# Patient Record
Sex: Male | Born: 1935 | Race: White | Hispanic: No | Smoking: Former smoker
Health system: Southern US, Community
[De-identification: ages and names within clinical notes are randomized; demographics above are authoritative.]

## PROBLEM LIST (undated history)

## (undated) DIAGNOSIS — Z973 Presence of spectacles and contact lenses: Secondary | ICD-10-CM

## (undated) DIAGNOSIS — I10 Essential (primary) hypertension: Secondary | ICD-10-CM

## (undated) DIAGNOSIS — E669 Obesity, unspecified: Secondary | ICD-10-CM

## (undated) DIAGNOSIS — B029 Zoster without complications: Secondary | ICD-10-CM

## (undated) DIAGNOSIS — M8580 Other specified disorders of bone density and structure, unspecified site: Secondary | ICD-10-CM

## (undated) DIAGNOSIS — Z972 Presence of dental prosthetic device (complete) (partial): Secondary | ICD-10-CM

## (undated) HISTORY — DX: Essential (primary) hypertension: I10

## (undated) HISTORY — DX: Zoster without complications: B02.9

## (undated) HISTORY — DX: Obesity, unspecified: E66.9

## (undated) HISTORY — PX: TONSILLECTOMY: SUR1361

## (undated) HISTORY — DX: Other specified disorders of bone density and structure, unspecified site: M85.80

## (undated) HISTORY — DX: Presence of dental prosthetic device (complete) (partial): Z97.2

## (undated) HISTORY — DX: Presence of spectacles and contact lenses: Z97.3

---

## 2001-07-07 ENCOUNTER — Encounter: Payer: Self-pay | Admitting: Family Medicine

## 2001-07-07 ENCOUNTER — Encounter: Admission: RE | Admit: 2001-07-07 | Discharge: 2001-07-07 | Payer: Self-pay | Admitting: Family Medicine

## 2003-10-21 DIAGNOSIS — B029 Zoster without complications: Secondary | ICD-10-CM

## 2003-10-21 HISTORY — DX: Zoster without complications: B02.9

## 2004-11-20 HISTORY — PX: INGUINAL HERNIA REPAIR: SUR1180

## 2005-03-17 ENCOUNTER — Emergency Department (HOSPITAL_COMMUNITY): Admission: EM | Admit: 2005-03-17 | Discharge: 2005-03-17 | Payer: Self-pay | Admitting: Emergency Medicine

## 2005-06-01 ENCOUNTER — Emergency Department (HOSPITAL_COMMUNITY): Admission: EM | Admit: 2005-06-01 | Discharge: 2005-06-01 | Payer: Self-pay | Admitting: Emergency Medicine

## 2006-06-01 ENCOUNTER — Ambulatory Visit: Payer: Self-pay | Admitting: Family Medicine

## 2006-07-02 ENCOUNTER — Ambulatory Visit: Payer: Self-pay | Admitting: Family Medicine

## 2006-07-31 ENCOUNTER — Ambulatory Visit: Payer: Self-pay | Admitting: Family Medicine

## 2006-08-31 ENCOUNTER — Ambulatory Visit: Payer: Self-pay | Admitting: Family Medicine

## 2006-10-01 ENCOUNTER — Ambulatory Visit: Payer: Self-pay | Admitting: Family Medicine

## 2006-10-29 ENCOUNTER — Ambulatory Visit: Payer: Self-pay | Admitting: Family Medicine

## 2007-04-27 ENCOUNTER — Ambulatory Visit: Payer: Self-pay | Admitting: Family Medicine

## 2007-10-26 ENCOUNTER — Ambulatory Visit: Payer: Self-pay | Admitting: Family Medicine

## 2008-02-07 ENCOUNTER — Ambulatory Visit: Payer: Self-pay | Admitting: Family Medicine

## 2008-10-20 HISTORY — PX: COLONOSCOPY W/ BIOPSIES AND POLYPECTOMY: SHX1376

## 2008-10-26 ENCOUNTER — Ambulatory Visit: Payer: Self-pay | Admitting: Family Medicine

## 2008-11-02 ENCOUNTER — Ambulatory Visit: Payer: Self-pay | Admitting: Internal Medicine

## 2008-11-13 ENCOUNTER — Ambulatory Visit: Payer: Self-pay | Admitting: Internal Medicine

## 2008-11-13 ENCOUNTER — Encounter: Payer: Self-pay | Admitting: Internal Medicine

## 2008-11-14 ENCOUNTER — Encounter: Payer: Self-pay | Admitting: Internal Medicine

## 2009-03-22 ENCOUNTER — Ambulatory Visit: Payer: Self-pay | Admitting: Family Medicine

## 2009-04-02 ENCOUNTER — Ambulatory Visit: Payer: Self-pay | Admitting: Family Medicine

## 2009-04-25 ENCOUNTER — Ambulatory Visit: Payer: Self-pay | Admitting: Family Medicine

## 2009-05-03 ENCOUNTER — Ambulatory Visit: Payer: Self-pay | Admitting: Family Medicine

## 2009-05-08 ENCOUNTER — Ambulatory Visit: Payer: Self-pay | Admitting: Family Medicine

## 2009-06-18 ENCOUNTER — Ambulatory Visit: Payer: Self-pay | Admitting: Family Medicine

## 2009-07-02 ENCOUNTER — Ambulatory Visit: Payer: Self-pay | Admitting: Family Medicine

## 2009-11-13 ENCOUNTER — Ambulatory Visit: Payer: Self-pay | Admitting: Family Medicine

## 2009-11-20 ENCOUNTER — Ambulatory Visit: Payer: Self-pay | Admitting: Family Medicine

## 2010-04-15 ENCOUNTER — Ambulatory Visit: Payer: Self-pay | Admitting: Family Medicine

## 2010-11-05 ENCOUNTER — Ambulatory Visit
Admission: RE | Admit: 2010-11-05 | Discharge: 2010-11-05 | Payer: Self-pay | Source: Home / Self Care | Attending: Family Medicine | Admitting: Family Medicine

## 2011-03-04 ENCOUNTER — Encounter: Payer: Self-pay | Admitting: Medical

## 2011-03-04 ENCOUNTER — Ambulatory Visit (INDEPENDENT_AMBULATORY_CARE_PROVIDER_SITE_OTHER): Payer: Medicare Other | Admitting: Medical

## 2011-03-04 VITALS — BP 130/68 | HR 60 | Ht 68.0 in | Wt 192.0 lb

## 2011-03-04 DIAGNOSIS — E871 Hypo-osmolality and hyponatremia: Secondary | ICD-10-CM

## 2011-03-04 DIAGNOSIS — R7301 Impaired fasting glucose: Secondary | ICD-10-CM | POA: Insufficient documentation

## 2011-03-04 DIAGNOSIS — Z0000 Encounter for general adult medical examination without abnormal findings: Secondary | ICD-10-CM

## 2011-03-04 DIAGNOSIS — I10 Essential (primary) hypertension: Secondary | ICD-10-CM

## 2011-03-04 LAB — POCT URINALYSIS DIPSTICK
Bilirubin, UA: NEGATIVE
Blood, UA: NEGATIVE
Glucose, UA: NEGATIVE
Ketones, UA: NEGATIVE
Leukocytes, UA: NEGATIVE
Nitrite, UA: NEGATIVE
Spec Grav, UA: 1.005
Urobilinogen, UA: NEGATIVE
pH, UA: 7

## 2011-03-04 LAB — CBC WITH DIFFERENTIAL/PLATELET
Basophils Absolute: 0.1 10*3/uL (ref 0.0–0.1)
HCT: 45.1 % (ref 39.0–52.0)
Lymphocytes Relative: 25 % (ref 12–46)
Monocytes Absolute: 1.2 10*3/uL — ABNORMAL HIGH (ref 0.1–1.0)
Neutro Abs: 6.7 10*3/uL (ref 1.7–7.7)
RDW: 12.2 % (ref 11.5–15.5)
WBC: 10.9 10*3/uL — ABNORMAL HIGH (ref 4.0–10.5)

## 2011-03-04 LAB — COMPREHENSIVE METABOLIC PANEL
ALT: 18 U/L (ref 0–53)
BUN: 9 mg/dL (ref 6–23)
CO2: 28 mEq/L (ref 19–32)
Calcium: 9 mg/dL (ref 8.4–10.5)
Chloride: 96 mEq/L (ref 96–112)
Creat: 0.81 mg/dL (ref 0.40–1.50)

## 2011-03-04 LAB — LIPID PANEL
Cholesterol: 182 mg/dL (ref 0–200)
HDL: 65 mg/dL (ref >39)
LDL Cholesterol: 99 mg/dL (ref 0–99)
Triglycerides: 91 mg/dL (ref <150)

## 2011-03-04 LAB — POCT GLYCOSYLATED HEMOGLOBIN (HGB A1C): Hemoglobin A1C: 5.5

## 2011-03-04 MED ORDER — BISOPROLOL-HYDROCHLOROTHIAZIDE 5-6.25 MG PO TABS: 1.0000 | ORAL_TABLET | Freq: Every day | ORAL | Status: DC

## 2011-03-04 MED ORDER — AMLODIPINE BESYLATE 5 MG PO TABS: 5.0000 mg | ORAL_TABLET | Freq: Every day | ORAL | Status: DC

## 2011-03-04 NOTE — Patient Instructions (Signed)
Preventative Care for Adults, Male       REGULAR HEALTH EXAMS:  A routine yearly physical is a good way to check in with your primary care provider about your health and preventive screening. It is also an opportunity to share updates about your health and any concerns you have, and receive a thorough all-over exam.   Most health insurance companies pay for at least some preventative services.  Check with your health plan for specific coverages.  WHAT PREVENTATIVE SERVICES DO MEN NEED?  Adult men should have their weight and blood pressure checked regularly.   Men age 35 and older should have their cholesterol levels checked regularly.  Beginning at age 50 and continuing to age 75, men should be screened for colorectal cancer.  Certain people should may need continued testing until age 85.  Other cancer screening may include exams for testicular and prostate cancer.  Updating vaccinations is part of preventative care.  Vaccinations help protect against diseases such as the flu.  Lab tests are generally done as part of preventative care to screen for anemia and blood disorders, to screen for problems with the kidneys and liver, to screen for bladder problems, to check blood sugar, and to check your cholesterol level.  Preventative services generally include counseling about diet, exercise, avoiding tobacco, drugs, excessive alcohol consumption, and sexually transmitted infections.    GENERAL RECOMMENDATIONS FOR GOOD HEALTH:  Healthy diet:  Eat a variety of foods, including fruit, vegetables, animal or vegetable protein, such as meat, fish, chicken, and eggs, or beans, lentils, tofu, and grains, such as rice.  Drink plenty of water daily.  Decrease saturated fat in the diet, avoid lots of red meat, processed foods, sweets, fast foods, and fried foods.  Exercise:  Aerobic exercise helps maintain good heart health. At least 30-40 minutes of moderate-intensity exercise is recommended.  For example, a brisk walk that increases your heart rate and breathing. This should be done on most days of the week.   Find a type of exercise or a variety of exercises that you enjoy so that it becomes a part of your daily life.  Examples are running, walking, swimming, water aerobics, and biking.  For motivation and support, explore group exercise such as aerobic class, spin class, Zumba, Yoga,or  martial arts, etc.    Set exercise goals for yourself, such as a certain weight goal, walk or run in a race such as a 5k walk/run.  Speak to your primary care provider about exercise goals.  Disease prevention:  If you smoke or chew tobacco, find out from your caregiver how to quit. It can literally save your life, no matter how long you have been a tobacco user. If you do not use tobacco, never begin.   Maintain a healthy diet and normal weight. Increased weight leads to problems with blood pressure and diabetes.   The Body Mass Index or BMI is a way of measuring how much of your body is fat. Having a BMI above 27 increases the risk of heart disease, diabetes, hypertension, stroke and other problems related to obesity. Your caregiver can help determine your BMI and based on it develop an exercise and dietary program to help you achieve or maintain this important measurement at a healthful level.  High blood pressure causes heart and blood vessel problems.  Persistent high blood pressure should be treated with medicine if weight loss and exercise do not work.   Fat and cholesterol leaves deposits in your arteries   that can block them. This causes heart disease and vessel disease elsewhere in your body.  If your cholesterol is found to be high, or if you have heart disease or certain other medical conditions, then you may need to have your cholesterol monitored frequently and be treated with medication.   Ask if you should have a stress test if your history suggests this. A stress test is a test done on  a treadmill that looks for heart disease. This test can find disease prior to there being a problem.  Avoid drinking alcohol in excess (more than two drinks per day).  Avoid use of street drugs. Do not share needles with anyone. Ask for professional help if you need assistance or instructions on stopping the use of alcohol, cigarettes, and/or drugs.  Brush your teeth twice a day with fluoride toothpaste, and floss once a day. Good oral hygiene prevents tooth decay and gum disease. The problems can be painful, unattractive, and can cause other health problems. Visit your dentist for a routine oral and dental check up and preventive care every 6-12 months.   Look at your skin regularly.  Use a mirror to look at your back. Notify your caregivers of changes in moles, especially if there are changes in shapes, colors, a size larger than a pencil eraser, an irregular border, or development of new moles.  Safety:  Use seatbelts 100% of the time, whether driving or as a passenger.  Use safety devices such as hearing protection if you work in environments with loud noise or significant background noise.  Use safety glasses when doing any work that could send debris in to the eyes.  Use a helmet if you ride a bike or motorcycle.  Use appropriate safety gear for contact sports.  Talk to your caregiver about gun safety.  Use sunscreen with a SPF (or skin protection factor) of 15 or greater.  Lighter skinned people are at a greater risk of skin cancer. Don't forget to also wear sunglasses in order to protect your eyes from too much damaging sunlight. Damaging sunlight can accelerate cataract formation.   Practice safe sex. Use condoms. Condoms are used for birth control and to help reduce the spread of sexually transmitted infections (or STIs).  Some of the STIs are gonorrhea (the clap), chlamydia, syphilis, trichomonas, herpes, HPV (human papilloma virus) and HIV (human immunodeficiency virus) which causes AIDS.  The herpes, HIV and HPV are viral illnesses that have no cure. These can result in disability, cancer and death.   Keep carbon monoxide and smoke detectors in your home functioning at all times. Change the batteries every 6 months or use a model that plugs into the wall.   Vaccinations:  Stay up to date with your tetanus shots and other required immunizations. You should have a booster for tetanus every 10 years. Be sure to get your flu shot every year, since 5%-20% of the [place] population comes down with the flu. The flu vaccine changes each year, so being vaccinated once is not enough. Get your shot in the fall, before the flu season peaks.   Other vaccines to consider:  Pneumococcal vaccine to protect against certain types of pneumonia.  This is normally recommended for adults age 65 or older.  However, adults younger than 75 years old with certain underlying conditions such as diabetes, heart or lung disease should also receive the vaccine.  Shingles vaccine to protect against Varicella Zoster if you are older than age 60, or younger   than 75 years old with certain underlying illness.  Hepatitis A vaccine to protect against a form of infection of the liver by a virus acquired from food.  Hepatitis B vaccine to protect against a form of infection of the liver by a virus acquired from blood or body fluids, particularly if you work in health care.  If you plan to travel internationally, check with your local health department for specific vaccination recommendations.  Cancer Screening:  Most routine colon cancer screening begins at the age of 50. On a yearly basis, doctors may provide special easy to use take-home tests to check for hidden blood in the stool. Sigmoidoscopy or colonoscopy can detect the earliest forms of colon cancer and is life saving. These tests use a small camera at the end of a tube to directly examine the colon. Speak to your caregiver about this at age 50, when routine  screening begins (and is repeated every 5 years unless early forms of pre-cancerous polyps or small growths are found).   At the age of 50 men usually start screening for prostate cancer every year. Screening may begin at a younger age for those with higher risk. Those at higher risk include African-Americans or having a family history of prostate cancer. There are two types of tests for prostate cancer:   Prostate-specific antigen (PSA) testing. Recent studies raise questions about prostate cancer using PSA and you should discuss this with your caregiver.   Digital rectal exam (in which your doctor's lubricated and gloved finger feels for enlargement of the prostate through the anus).   Screening for testicular cancer.  Do a monthly exam of your testicles. Gently roll each testicle between your thumb and fingers, feeling for any abnormal lumps. The best time to do this is after a hot shower or bath when the tissues are looser. Notify your caregivers of any lumps, tenderness or changes in size or shape immediately.     

## 2011-03-04 NOTE — Progress Notes (Signed)
Subjective:   HPI Here for physical exam, and general recheck. Needs medication refills, is fasting for labs. In general been doing okay, exercising regularly, trying the eat healthy, otherwise feels fine. No new complaints. He does note occasional left ear pressure, has seen ENT before for the same problem, and has been given nasal sprays before. Gets flu shot yearly, last pneumococcal vaccine through [facility] within the last 5 years.  Past Medical History  Diagnosis Date  . Hypertension   . Wears glasses    Past Surgical History  Procedure Date  . Right inguinal herniorrhaphy 11/2004  . Tonsillectomy   . Colonoscopy 2010    Benign polyps, repeat in 5 years    History   Social History  . Marital Status: Widowed    Spouse Name: N/A    Number of Children: N/A  . Years of Education: N/A   Occupational History  . Not on file.   Social History Main Topics  . Smoking status: Former Games developer  . Smokeless tobacco: Never Used  . Alcohol Use: 4.2 oz/week    7 Cans of beer per week  . Drug Use: No  . Sexually Active: Not on file     widowed; exercises daily with weights and aerobic   Other Topics Concern  . Not on file   Social History Narrative  . No narrative on file    Review of Systems Constitutional: denies fever, chills, sweats, unexpected weight change, anorexia, fatigue Allergy: negative; denies recent sneezing, itching, congestion Dermatology: denies changing moles, rash, lumps, new worrisome lesions ENT: Occasional left ear pain and pressure; no runny nose, sore throat, hoarseness, sinus pain, teeth pain, tinnitus, hearing loss, epistaxis Cardiology: denies chest pain, palpitations, edema, orthopnea, paroxysmal nocturnal dyspnea Respiratory: denies cough, shortness of breath, dyspnea on exertion, wheezing, hemoptysis Gastroenterology: denies abdominal pain, nausea, vomiting, diarrhea, constipation, blood in stool, changes in bowel movement, dysphagia Hematology:  denies bleeding or bruising problems Musculoskeletal: denies arthralgias, myalgias, joint swelling, back pain, neck pain, cramping, gait changes Ophthalmology: denies vision changes, eye redness, itching, discharge Urology: denies dysuria, difficulty urinating, hematuria, urinary frequency, urgency, incontinence Neurology: no headache, weakness, tingling, numbness, speech abnormality, memory loss, falls, dizziness Psychology: denies depressed mood, agitation, sleep problems     Objective:   Physical Exam Filed Vitals:   03/04/11 0847  BP: 130/68  Pulse: 60    General appearance: alert, no distress, WD/WN, white male , looks stated age Skin: Left dorsal forearm with a few crusting small raised 3-4 mm papules that appear to be actinic keratoses, left upper arm laterally with 1 cm roundish raise somewhat verrucoid appearing lesion, scattered flat macules in solar keratoses on arms and face, scattered benign-appearing macules on the back, otherwise warm, dry HEENT: normocephalic, conjunctiva/corneas normal, sclerae anicteric, PERRLA, EOMi, nares patent, no discharge or erythema, pharynx normal Oral cavity: upper partial plate, MMM, tongue normal, teeth normal Neck: supple, no lymphadenopathy, no thyromegaly, no masses, normal ROM Chest: non tender, normal shape and expansion Heart: RRR, normal S1, S2, no murmurs Lungs: CTA bilaterally, no wheezes, rhonchi, or rales Abdomen: +bs, soft, non tender, non distended, somewhat guarded, no masses, no hepatomegaly, no splenomegaly, no bruits Back: non tender, normal ROM, no scoliosis Musculoskeletal: upper extremities non tender, no obvious deformity, normal ROM throughout, lower extremities non tender, no obvious deformity, normal ROM throughout Extremities: no edema, no cyanosis, no clubbing Genitourinary: Normal external genitalia, circumcised, no scrotal masses, nontender, no lesions, no hernias Rectal: No hemorrhoids, prostate within normal  limits, no nodules, guaiac negative Pulses: 2+ symmetric, upper and lower extremities, normal cap refill Neurological: alert, oriented x 3, CN2-12 intact, strength normal upper extremities and lower extremities, sensation normal throughout, DTRs 2+ throughout, no cerebellar signs, gait normal Psychiatric: normal affect, behavior normal, pleasant      Assessment & Plan:    Encounter Diagnoses  Name Primary?  . Annual physical exam   . Impaired fasting glucose   . Hypertension Yes  . Hyponatremia    Recommended he continue regular exercise, eat healthy low-fat, low-salt diet, advised weight loss, discussed preventative care, vaccines, lab tests today. Handout given today.  Impaired fasting glucose-hemoglobin A1c today within normal range not diabetic.  Hypertension-controlled on current medication, refill sent.  We will call with lab results.

## 2011-03-05 ENCOUNTER — Telehealth: Payer: Self-pay | Admitting: *Deleted

## 2011-03-05 NOTE — Telephone Encounter (Addendum)
Message copied by Ellsworth Lennox on Wed Mar 05, 2011  2:56 PM ------      Message from: Aleen Campi, DAVID      Created: Wed Mar 05, 2011  8:56 AM       Labs look good.  Normal cholesterol, liver, kidneys, lytes, blood counts, and urine.   His sodium was a little low, but I believe this is related to his medication with the hydrochlorothiazide fluid pill; nothing to worry about.   C/t same meds, c/t regular exercise and healthy diet.  As long as he is doing well without problems, we can see him back in 6-12 mo.    Patient informed of all labs.  Patient requested copy of labs and copy was mailed to patient.  Patient stated he is doing well, with no problems at this time.  Will come back in 6 to 12 months.  CM, LPN

## 2011-03-10 ENCOUNTER — Encounter: Payer: Self-pay | Admitting: Medical

## 2012-03-05 ENCOUNTER — Ambulatory Visit (INDEPENDENT_AMBULATORY_CARE_PROVIDER_SITE_OTHER): Payer: Medicare Other | Admitting: Medical

## 2012-03-05 ENCOUNTER — Encounter: Payer: Self-pay | Admitting: Medical

## 2012-03-05 VITALS — BP 130/60 | HR 58 | Temp 98.2°F | Resp 16 | Ht 68.0 in | Wt 195.0 lb

## 2012-03-05 DIAGNOSIS — I10 Essential (primary) hypertension: Secondary | ICD-10-CM | POA: Insufficient documentation

## 2012-03-05 DIAGNOSIS — R7301 Impaired fasting glucose: Secondary | ICD-10-CM | POA: Diagnosis not present

## 2012-03-05 DIAGNOSIS — Z0000 Encounter for general adult medical examination without abnormal findings: Secondary | ICD-10-CM | POA: Diagnosis not present

## 2012-03-05 LAB — LIPID PANEL
Cholesterol: 176 mg/dL (ref 0–200)
HDL: 67 mg/dL (ref >39)
Total CHOL/HDL Ratio: 2.6 Ratio
Triglycerides: 83 mg/dL (ref <150)

## 2012-03-05 LAB — COMPREHENSIVE METABOLIC PANEL
BUN: 11 mg/dL (ref 6–23)
CO2: 29 mEq/L (ref 19–32)
Calcium: 8.8 mg/dL (ref 8.4–10.5)
Chloride: 96 mEq/L (ref 96–112)
Creat: 0.84 mg/dL (ref 0.50–1.35)
Glucose, Bld: 95 mg/dL (ref 70–99)

## 2012-03-05 MED ORDER — AMLODIPINE BESYLATE 5 MG PO TABS: 5.0000 mg | ORAL_TABLET | Freq: Every day | ORAL | Status: DC

## 2012-03-05 MED ORDER — BISOPROLOL-HYDROCHLOROTHIAZIDE 5-6.25 MG PO TABS: 1.0000 | ORAL_TABLET | Freq: Every day | ORAL | Status: DC

## 2012-03-05 NOTE — Progress Notes (Addendum)
Subjective:   HPI  Daniel Andersen is a 78 y.o. male who presents for an annual wellness exam for medicare.  He sees no other doctors.    He is also here for recheck on blood pressure, needs refills.   No particular c/o otherwise.    Impaired fasting glucose - he reports exercising most days of the week at the [facility] with cardio and weights.  Eating healthy per his report. He does drink 2-4 beers on any given day.  Reviewed their medical, surgical, family, social, medication, and allergy history and updated chart as appropriate.  Past Medical History  Diagnosis Date  . Hypertension   . Wears glasses     Past Surgical History  Procedure Date  . Right inguinal herniorrhaphy 11/2004  . Tonsillectomy   . Colonoscopy 2010    Benign polyps, repeat in 5 years    History reviewed. No pertinent family history.  History   Social History  . Marital Status: Widowed    Spouse Name: N/A    Number of Children: N/A  . Years of Education: N/A   Occupational History  . retired, works part time - [employer]    Social History Main Topics  . Smoking status: Former Games developer  . Smokeless tobacco: Never Used  . Alcohol Use: 8.4 oz/week    14 Cans of beer per week  . Drug Use: No  . Sexually Active: Not on file     widowed; exercises daily with weights and aerobic   Other Topics Concern  . Not on file   Social History Narrative   Widowed, daughter in [place], exercising - gym every morning - cardio with treadmill, machine weights    Current Outpatient Prescriptions on File Prior to Visit  Medication Sig Dispense Refill  . amLODipine (NORVASC) 5 MG tablet Take 1 tablet (5 mg total) by mouth daily.  90 tablet  3  . aspirin 325 MG tablet Take 325 mg by mouth daily. 1/2 tablet daily      . bisoprolol-hydrochlorothiazide (ZIAC) 5-6.25 MG per tablet Take 1 tablet by mouth daily.  90 tablet  3  . Cholecalciferol (VITAMIN D) 1000 UNITS capsule Take 1,000 Units by mouth daily.        .  fish oil-omega-3 fatty acids 1000 MG capsule Take 2 g by mouth daily.        . Multiple Vitamins-Minerals (CENTRUM SILVER ULTRA MENS) TABS Take 1 tablet by mouth daily.          No Known Allergies   Review of Systems Constitutional: denies fever, chills, sweats, unexpected weight change, anorexia, fatigue Allergy: negative; denies recent sneezing, itching, congestion Dermatology: denies changing moles, rash, lumps, new worrisome lesions ENT: no runny nose, ear pain, sore throat, hoarseness, sinus pain, teeth pain, tinnitus, hearing loss, epistaxis Cardiology: denies chest pain, palpitations, edema, orthopnea, paroxysmal nocturnal dyspnea Respiratory: denies cough, shortness of breath, dyspnea on exertion, wheezing, hemoptysis Gastroenterology: denies abdominal pain, nausea, vomiting, diarrhea, constipation, blood in stool, changes in bowel movement, dysphagia Hematology: denies bleeding or bruising problems Musculoskeletal: denies arthralgias, myalgias, joint swelling, back pain, neck pain, cramping, gait changes Ophthalmology: denies vision changes, eye redness, itching, discharge Urology: denies dysuria, difficulty urinating, hematuria, urinary frequency, urgency, incontinence Neurology: no headache, weakness, tingling, numbness, speech abnormality, memory loss, falls, dizziness Psychology: denies depressed mood, agitation, sleep problems     Objective:   Physical Exam  Filed Vitals:   03/05/12 0916  BP: 130/60  Pulse: 58  Temp:  98.2 F (36.8 C)  Resp: 16    General appearance: alert, no distress, WD/WN, white male Skin:warm, dry Oral cavity: MMM, tongue normal, teeth normal Neck: supple, no lymphadenopathy, no thyromegaly, no masses, normal ROM, no bruits Heart: RRR, normal S1, S2, no murmurs Lungs: CTA bilaterally, no wheezes, rhonchi, or rales Abdomen: +bs, soft, non tender, non distended, no masses, no hepatomegaly, no splenomegaly, no bruits Extremities: no edema, no  cyanosis, no clubbing Pulses: 2+ symmetric, upper and lower extremities, normal cap refill   Assessment and Plan :    Encounter Diagnoses  Name Primary?  . Essential hypertension, benign Yes  . Impaired fasting blood sugar   . Medicare annual wellness visit, subsequent     Physical exam - discussed healthy lifestyle, diet, exercise, preventative care, vaccinations, and addressed their concerns.  Handout given.   Advised he limit alcohol to 2 drinks or less daily.  Discussed safety, reviewed his screens for ADLs, depression, fall risk.  He lives alone, independent, no current concerns for falls, depression, and independent with ADLs.  HTN - continue current medication, labs today.  C/t regular exercise, low salt diet.  Impaired fasting glucose - labs today   Follow-up pending labs.

## 2012-03-05 NOTE — Patient Instructions (Signed)
Hypertension - continue your current medications, continue daily exercise, and eat a healthy low fat diet.    Diet - limit your alcohol consumption to 2 drink or less daily.   Overweight - work on [employer].  Preventative Care for Adults, Male       REGULAR HEALTH EXAMS:  A routine yearly physical is a good way to check in with your primary care provider about your health and preventive screening. It is also an opportunity to share updates about your health and any concerns you have, and receive a thorough all-over exam.   Most health insurance companies pay for at least some preventative services.  Check with your health plan for specific coverages.  WHAT PREVENTATIVE SERVICES DO MEN NEED?  Adult men should have their weight and blood pressure checked regularly.   Men age 64 and older should have their cholesterol levels checked regularly.  Beginning at age 64 and continuing to age 68, men should be screened for colorectal cancer.  Certain people should may need continued testing until age 60.  Other cancer screening may include exams for testicular and prostate cancer.  Updating vaccinations is part of preventative care.  Vaccinations help protect against diseases such as the flu.  Lab tests are generally done as part of preventative care to screen for anemia and blood disorders, to screen for problems with the kidneys and liver, to screen for bladder problems, to check blood sugar, and to check your cholesterol level.  Preventative services generally include counseling about diet, exercise, avoiding tobacco, drugs, excessive alcohol consumption, and sexually transmitted infections.    GENERAL RECOMMENDATIONS FOR GOOD HEALTH:  Healthy diet:  Eat a variety of foods, including fruit, vegetables, animal or vegetable protein, such as meat, fish, chicken, and eggs, or beans, lentils, tofu, and grains, such as rice.  Drink plenty of water daily.  Decrease saturated fat in the diet,  avoid lots of red meat, processed foods, sweets, fast foods, and fried foods.  Exercise:  Aerobic exercise helps maintain good heart health. At least 30-40 minutes of moderate-intensity exercise is recommended. For example, a brisk walk that increases your heart rate and breathing. This should be done on most days of the week.   Find a type of exercise or a variety of exercises that you enjoy so that it becomes a part of your daily life.  Examples are running, walking, swimming, water aerobics, and biking.  For motivation and support, explore group exercise such as aerobic class, spin class, Zumba, Yoga,or  martial arts, etc.    Set exercise goals for yourself, such as a certain weight goal, walk or run in a race such as a 5k walk/run.  Speak to your primary care provider about exercise goals.  Disease prevention:  If you smoke or chew tobacco, find out from your caregiver how to quit. It can literally save your life, no matter how long you have been a tobacco user. If you do not use tobacco, never begin.   Maintain a healthy diet and normal weight. Increased weight leads to problems with blood pressure and diabetes.   The Body Mass Index or BMI is a way of measuring how much of your body is fat. Having a BMI above 27 increases the risk of heart disease, diabetes, hypertension, stroke and other problems related to obesity. Your caregiver can help determine your BMI and based on it develop an exercise and dietary program to help you achieve or maintain this important measurement at a  healthful level.  High blood pressure causes heart and blood vessel problems.  Persistent high blood pressure should be treated with medicine if weight loss and exercise do not work.   Fat and cholesterol leaves deposits in your arteries that can block them. This causes heart disease and vessel disease elsewhere in your body.  If your cholesterol is found to be high, or if you have heart disease or certain other  medical conditions, then you may need to have your cholesterol monitored frequently and be treated with medication.   Ask if you should have a stress test if your history suggests this. A stress test is a test done on a treadmill that looks for heart disease. This test can find disease prior to there being a problem.  Avoid drinking alcohol in excess (more than two drinks per day).  Avoid use of street drugs. Do not share needles with anyone. Ask for professional help if you need assistance or instructions on stopping the use of alcohol, cigarettes, and/or drugs.  Brush your teeth twice a day with fluoride toothpaste, and floss once a day. Good oral hygiene prevents tooth decay and gum disease. The problems can be painful, unattractive, and can cause other health problems. Visit your dentist for a routine oral and dental check up and preventive care every 6-12 months.   Look at your skin regularly.  Use a mirror to look at your back. Notify your caregivers of changes in moles, especially if there are changes in shapes, colors, a size larger than a pencil eraser, an irregular border, or development of new moles.  Safety:  Use seatbelts 100% of the time, whether driving or as a passenger.  Use safety devices such as hearing protection if you work in environments with loud noise or significant background noise.  Use safety glasses when doing any work that could send debris in to the eyes.  Use a helmet if you ride a bike or motorcycle.  Use appropriate safety gear for contact sports.  Talk to your caregiver about gun safety.  Use sunscreen with a SPF (or skin protection factor) of 15 or greater.  Lighter skinned people are at a greater risk of skin cancer. Don't forget to also wear sunglasses in order to protect your eyes from too much damaging sunlight. Damaging sunlight can accelerate cataract formation.   Practice safe sex. Use condoms. Condoms are used for birth control and to help reduce the spread  of sexually transmitted infections (or STIs).  Some of the STIs are gonorrhea (the clap), chlamydia, syphilis, trichomonas, herpes, HPV (human papilloma virus) and HIV (human immunodeficiency virus) which causes AIDS. The herpes, HIV and HPV are viral illnesses that have no cure. These can result in disability, cancer and death.   Keep carbon monoxide and smoke detectors in your home functioning at all times. Change the batteries every 6 months or use a model that plugs into the wall.   Vaccinations:  Stay up to date with your tetanus shots and other required immunizations. You should have a booster for tetanus every 10 years. Be sure to get your flu shot every year, since 5%-20% of the [place] population comes down with the flu. The flu vaccine changes each year, so being vaccinated once is not enough. Get your shot in the fall, before the flu season peaks.   Other vaccines to consider:  Pneumococcal vaccine to protect against certain types of pneumonia.  This is normally recommended for adults age 56 or older.  However, adults younger than 76 years old with certain underlying conditions such as diabetes, heart or lung disease should also receive the vaccine.  Shingles vaccine to protect against Varicella Zoster if you are older than age 36, or younger than 76 years old with certain underlying illness.  Hepatitis A vaccine to protect against a form of infection of the liver by a virus acquired from food.  Hepatitis B vaccine to protect against a form of infection of the liver by a virus acquired from blood or body fluids, particularly if you work in health care.  If you plan to travel internationally, check with your local health department for specific vaccination recommendations.  Cancer Screening:  Most routine colon cancer screening begins at the age of 77. On a yearly basis, doctors may provide special easy to use take-home tests to check for hidden blood in the stool. Sigmoidoscopy or  colonoscopy can detect the earliest forms of colon cancer and is life saving. These tests use a small camera at the end of a tube to directly examine the colon. Speak to your caregiver about this at age 17, when routine screening begins (and is repeated every 5 years unless early forms of pre-cancerous polyps or small growths are found).   At the age of 47 men usually start screening for prostate cancer every year. Screening may begin at a younger age for those with higher risk. Those at higher risk include African-Americans or having a family history of prostate cancer. There are two types of tests for prostate cancer:   Prostate-specific antigen (PSA) testing. Recent studies raise questions about prostate cancer using PSA and you should discuss this with your caregiver.   Digital rectal exam (in which your doctor's lubricated and gloved finger feels for enlargement of the prostate through the anus).   Screening for testicular cancer.  Do a monthly exam of your testicles. Gently roll each testicle between your thumb and fingers, feeling for any abnormal lumps. The best time to do this is after a hot shower or bath when the tissues are looser. Notify your caregivers of any lumps, tenderness or changes in size or shape immediately.

## 2012-06-28 ENCOUNTER — Encounter: Payer: Self-pay | Admitting: Internal Medicine

## 2012-06-28 DIAGNOSIS — Z23 Encounter for immunization: Secondary | ICD-10-CM | POA: Diagnosis not present

## 2012-08-06 ENCOUNTER — Other Ambulatory Visit: Payer: Self-pay

## 2012-08-06 DIAGNOSIS — C44211 Basal cell carcinoma of skin of unspecified ear and external auricular canal: Secondary | ICD-10-CM | POA: Diagnosis not present

## 2012-08-06 DIAGNOSIS — L570 Actinic keratosis: Secondary | ICD-10-CM | POA: Diagnosis not present

## 2012-08-23 DIAGNOSIS — H6120 Impacted cerumen, unspecified ear: Secondary | ICD-10-CM | POA: Diagnosis not present

## 2012-08-23 DIAGNOSIS — H6500 Acute serous otitis media, unspecified ear: Secondary | ICD-10-CM | POA: Diagnosis not present

## 2012-08-23 DIAGNOSIS — H902 Conductive hearing loss, unspecified: Secondary | ICD-10-CM | POA: Diagnosis not present

## 2012-09-24 DIAGNOSIS — L570 Actinic keratosis: Secondary | ICD-10-CM | POA: Diagnosis not present

## 2012-09-24 DIAGNOSIS — L219 Seborrheic dermatitis, unspecified: Secondary | ICD-10-CM | POA: Diagnosis not present

## 2012-09-24 DIAGNOSIS — Z85828 Personal history of other malignant neoplasm of skin: Secondary | ICD-10-CM | POA: Diagnosis not present

## 2012-11-02 DIAGNOSIS — J00 Acute nasopharyngitis [common cold]: Secondary | ICD-10-CM | POA: Diagnosis not present

## 2012-11-02 DIAGNOSIS — H902 Conductive hearing loss, unspecified: Secondary | ICD-10-CM | POA: Diagnosis not present

## 2012-11-02 DIAGNOSIS — H6500 Acute serous otitis media, unspecified ear: Secondary | ICD-10-CM | POA: Diagnosis not present

## 2013-02-17 ENCOUNTER — Telehealth: Payer: Self-pay | Admitting: Medical

## 2013-02-17 DIAGNOSIS — I10 Essential (primary) hypertension: Secondary | ICD-10-CM

## 2013-02-17 MED ORDER — AMLODIPINE BESYLATE 5 MG PO TABS: 5.0000 mg | ORAL_TABLET | Freq: Every day | ORAL | Status: DC

## 2013-02-17 MED ORDER — BISOPROLOL-HYDROCHLOROTHIAZIDE 5-6.25 MG PO TABS: 1.0000 | ORAL_TABLET | Freq: Every day | ORAL | Status: DC

## 2013-02-17 NOTE — Telephone Encounter (Signed)
Done for #30 of each  

## 2013-03-08 ENCOUNTER — Other Ambulatory Visit: Payer: Self-pay | Admitting: Medical

## 2013-03-08 ENCOUNTER — Encounter: Payer: Self-pay | Admitting: Medical

## 2013-03-08 ENCOUNTER — Ambulatory Visit (INDEPENDENT_AMBULATORY_CARE_PROVIDER_SITE_OTHER): Payer: Medicare Other | Admitting: Medical

## 2013-03-08 VITALS — BP 130/70 | HR 62 | Temp 97.7°F | Resp 16 | Ht 63.0 in | Wt 196.0 lb

## 2013-03-08 DIAGNOSIS — Z79899 Other long term (current) drug therapy: Secondary | ICD-10-CM | POA: Diagnosis not present

## 2013-03-08 DIAGNOSIS — R7301 Impaired fasting glucose: Secondary | ICD-10-CM

## 2013-03-08 DIAGNOSIS — R29890 Loss of height: Secondary | ICD-10-CM

## 2013-03-08 DIAGNOSIS — I10 Essential (primary) hypertension: Secondary | ICD-10-CM

## 2013-03-08 DIAGNOSIS — M899 Disorder of bone, unspecified: Secondary | ICD-10-CM

## 2013-03-08 DIAGNOSIS — M40209 Unspecified kyphosis, site unspecified: Secondary | ICD-10-CM

## 2013-03-08 DIAGNOSIS — E669 Obesity, unspecified: Secondary | ICD-10-CM

## 2013-03-08 DIAGNOSIS — M949 Disorder of cartilage, unspecified: Secondary | ICD-10-CM

## 2013-03-08 DIAGNOSIS — F1010 Alcohol abuse, uncomplicated: Secondary | ICD-10-CM

## 2013-03-08 DIAGNOSIS — M4000 Postural kyphosis, site unspecified: Secondary | ICD-10-CM

## 2013-03-08 LAB — CBC WITH DIFFERENTIAL/PLATELET
Basophils Relative: 1 % (ref 0–1)
HCT: 43.2 % (ref 39.0–52.0)
Hemoglobin: 15.3 g/dL (ref 13.0–17.0)
Lymphs Abs: 2.2 10*3/uL (ref 0.7–4.0)
MCH: 35.4 pg — ABNORMAL HIGH (ref 26.0–34.0)
MCHC: 35.4 g/dL (ref 30.0–36.0)
Monocytes Absolute: 1.2 10*3/uL — ABNORMAL HIGH (ref 0.1–1.0)
Monocytes Relative: 12 % (ref 3–12)
Neutro Abs: 6.1 10*3/uL (ref 1.7–7.7)
Neutrophils Relative %: 63 % (ref 43–77)
RBC: 4.32 MIL/uL (ref 4.22–5.81)

## 2013-03-08 MED ORDER — AMLODIPINE BESYLATE 5 MG PO TABS: 5.0000 mg | ORAL_TABLET | Freq: Every day | ORAL | Status: DC

## 2013-03-08 MED ORDER — BISOPROLOL-HYDROCHLOROTHIAZIDE 5-6.25 MG PO TABS: 1.0000 | ORAL_TABLET | Freq: Every day | ORAL | Status: DC

## 2013-03-08 NOTE — Progress Notes (Signed)
Subjective:   HPI  Daniel Andersen is a 79 y.o. male who presents for a complete physical/general yearly recheck.   Preventative care: Last ophthalmology visit:n/a Last dental visit:n/a Last colonoscopy:11/13/2008 Last prostate exam: ? Last EKG:11/13/2009 Last labs:03/05/2012  Prior vaccinations: TD or Tdap:10/26/2008 Influenza:06/2012 Pneumococcal:n/a Shingles/Zostavax:2005  Advanced directive:n/a Health care power of attorney:n/a Living will:yes  Concerns: Sees dermatology, Dr. 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  Said the facial lesions were benign.  HTN - compliant, exercising regularly, but still eats the same diet as usual, drinks aobut 3 beers or wine daily.   At the gym gets 110-120 SBP, 70s DBP.  Reviewed their medical, surgical, family, social, medication, and allergy history and updated chart as appropriate.   Past Medical History  Diagnosis Date  . Hypertension   . Wears glasses   . Wears partial dentures   . Shingles 2005    Past Surgical History  Procedure Laterality Date  . Right inguinal herniorrhaphy  11/2004  . Tonsillectomy    . Colonoscopy  2010    Benign polyps, repeat in 5 years    History reviewed. No pertinent family history.  History   Social History  . Marital Status: Widowed    Spouse Name: N/A    Number of Children: N/A  . Years of Education: N/A   Occupational History  . retired, works part time - [employer]    Social History Main Topics  . Smoking status: Former Games developer  . Smokeless tobacco: Never Used  . Alcohol Use: 8.4 oz/week    14 Cans of beer per week  . Drug Use: No  . Sexually Active: Not on file     Comment: widowed; exercises daily with weights and aerobic   Other Topics Concern  . Not on file   Social History Narrative   Widowed, daughter in [place], exercising - gym every morning - cardio with treadmill, machine weights.   Works at [employer].    Travels up to his  hometown in [place] occasionally    Current Outpatient Prescriptions on File Prior to Visit  Medication Sig Dispense Refill  . amLODipine (NORVASC) 5 MG tablet Take 1 tablet (5 mg total) by mouth daily.  30 tablet  0  . aspirin 325 MG tablet Take 325 mg by mouth daily. 1/2 tablet daily      . bisoprolol-hydrochlorothiazide (ZIAC) 5-6.25 MG per tablet Take 1 tablet by mouth daily.  30 tablet  0  . Cholecalciferol (VITAMIN D) 1000 UNITS capsule Take 400 Units by mouth daily.       . fish oil-omega-3 fatty acids 1000 MG capsule Take 2 g by mouth daily.        . Multiple Vitamins-Minerals (CENTRUM SILVER ULTRA MENS) TABS Take 1 tablet by mouth daily.         No current facility-administered medications on file prior to visit.    No Known Allergies   Review of Systems Constitutional: -fever, -chills, -sweats, -unexpected weight change, -decreased appetite, -fatigue Allergy: -sneezing, -itching, -congestion Dermatology: -changing moles, --rash, -lumps ENT: -runny nose, -ear pain, -sore throat, -hoarseness, -sinus pain, -teeth pain, + ringing in ears, -hearing loss, -nosebleeds Cardiology: -chest pain, -palpitations, -swelling, -difficulty breathing when lying flat, -waking up short of breath Respiratory: -cough, -shortness of breath, -difficulty breathing with exercise or exertion, -wheezing, -coughing up blood Gastroenterology: -abdominal pain, -nausea, -vomiting, -diarrhea, -constipation, -blood in stool, -changes in bowel movement, -difficulty swallowing or eating Hematology: -  bleeding, -bruising  Musculoskeletal: -joint aches, -muscle aches, -joint swelling, -back pain, -neck pain, -cramping, -changes in gait Ophthalmology: denies vision changes, eye redness, itching, discharge Urology: -burning with urination, -difficulty urinating, -blood in urine, -urinary frequency, -urgency, -incontinence Neurology: -headache, -weakness, -tingling, -numbness, -memory loss, -falls,  -dizziness Psychology: -depressed mood, -agitation, -sleep problems     Objective:   Physical Exam  Filed Vitals:   03/08/13 0914  BP: 130/70  Pulse: 62  Temp: 97.7 F (36.5 C)  Resp: 16    General appearance: alert, no distress, WD/WN, white male Skin: raised flesh colored lesions on bilat cheeks, unchanged per patient, few scattered seborrheic keratosis of back, other scattered benign appearing macules HEENT: normocephalic, conjunctiva/corneas normal, sclerae anicteric, PERRLA, EOMi, nares patent, no discharge or erythema, pharynx normal Oral cavity: MMM, tongue normal, teeth in good repair, partial upper and lower dentures present Neck: supple, no lymphadenopathy, no thyromegaly, no masses, normal ROM, no bruits Chest: non tender, normal shape and expansion Heart: RRR, normal S1, S2, no murmurs Lungs: CTA bilaterally, no wheezes, rhonchi, or rales Abdomen: +bs, soft, non tender, non distended, no masses, no hepatomegaly, no splenomegaly, no bruits Back: upper back showing more kyphosis than prior, non tender, othewrise normal ROM, no scoliosis Musculoskeletal: upper extremities non tender, no obvious deformity, normal ROM throughout, lower extremities non tender, no obvious deformity, normal ROM throughout Extremities: no edema, no cyanosis, no clubbing Pulses: 2+ symmetric, upper and lower extremities, normal cap refill Neurological: alert, oriented x 3, CN2-12 intact, strength normal upper extremities and lower extremities, sensation normal throughout, DTRs 2+ throughout, no cerebellar signs, gait normal Psychiatric: normal affect, behavior normal, pleasant  GU: normal male external genitalia, nontender, no masses, no hernia, no lymphadenopathy Rectal: deferred   Assessment and Plan :      Encounter Diagnoses  Name Primary?  . Essential hypertension, benign Yes  . Loss of height   . Excessive drinking alcohol   . Kyphosis   . Disorder of bone and cartilage,  unspecified   . Obesity, unspecified   . Impaired fasting blood sugar   . Encounter for long-term (current) use of other medications     Physical exam - discussed healthy lifestyle, diet, exercise, preventative care, vaccinations, and addressed their concerns.  Advised he see dentist and eye doctors yearly.    HTN - controlled, c/t same medication   Excessive alcohol consumption - advised less than 2 drinks daily  Loss of height, kyphosis - will try and get bone density and Vit D lab today  Obesity - work on diet changes to lose weight  Follow-up pending labs

## 2013-03-08 NOTE — Patient Instructions (Signed)
   Continue exercising regularly  Continue your current medications daily  See your eye doctor and dentist yearly  Check on insurance coverage for Shingles vaccine  Cut down on alcohol consumption.   Preferably only drink 1 beer daily.    We will call with lab results.  Try and lose some weight through diet changes.

## 2013-03-09 ENCOUNTER — Other Ambulatory Visit: Payer: Self-pay | Admitting: Medical

## 2013-03-09 DIAGNOSIS — E871 Hypo-osmolality and hyponatremia: Secondary | ICD-10-CM

## 2013-03-09 LAB — COMPREHENSIVE METABOLIC PANEL
AST: 23 U/L (ref 0–37)
Albumin: 3.9 g/dL (ref 3.5–5.2)
Alkaline Phosphatase: 65 U/L (ref 39–117)
BUN: 10 mg/dL (ref 6–23)
Calcium: 9.2 mg/dL (ref 8.4–10.5)
Creat: 0.79 mg/dL (ref 0.50–1.35)
Glucose, Bld: 95 mg/dL (ref 70–99)

## 2013-03-09 LAB — HEMOGLOBIN A1C
Hgb A1c MFr Bld: 5.4 % (ref <5.7)
Mean Plasma Glucose: 108 mg/dL (ref <117)

## 2013-03-10 NOTE — Progress Notes (Signed)
Quick Note:  CALLED PT INFORMED For patient:   His labs show glucose, kidneys, liver, blood counts ok. However, his sodium is still low, lower than prior. I am adding a thyroid test, and I want him to go for Chest Xray to look for other causes of low sodium. chandra will call him back about bone density test. Please have him go for chest xray. Order in system. Also ask him to cut back on beer intake as we discussed. PT ADVISED WORD FOR WORD AND TO GO TO [facility] TO HAVE CHEST X RAY DONE HE SAID HE ALREADY HAD BONE DENSITY SCHEDULED AND GAVE TO JO TO HAVE A1C DONE  ______

## 2013-03-16 ENCOUNTER — Other Ambulatory Visit: Payer: Medicare Other

## 2013-03-17 ENCOUNTER — Ambulatory Visit
Admission: RE | Admit: 2013-03-17 | Discharge: 2013-03-17 | Disposition: A | Discharge status: Home / Self Care | Payer: Medicare Other | Source: Ambulatory Visit | Attending: Medical | Admitting: Medical

## 2013-03-17 DIAGNOSIS — M899 Disorder of bone, unspecified: Secondary | ICD-10-CM

## 2013-03-17 DIAGNOSIS — M949 Disorder of cartilage, unspecified: Secondary | ICD-10-CM

## 2013-03-20 DIAGNOSIS — M8580 Other specified disorders of bone density and structure, unspecified site: Secondary | ICD-10-CM

## 2013-03-20 HISTORY — DX: Other specified disorders of bone density and structure, unspecified site: M85.80

## 2013-03-21 ENCOUNTER — Other Ambulatory Visit: Payer: Self-pay | Admitting: Medical

## 2013-03-21 ENCOUNTER — Emergency Department (HOSPITAL_COMMUNITY): Payer: Medicare Other

## 2013-03-21 ENCOUNTER — Other Ambulatory Visit: Payer: Self-pay

## 2013-03-21 ENCOUNTER — Encounter (HOSPITAL_COMMUNITY): Payer: Self-pay | Admitting: *Deleted

## 2013-03-21 ENCOUNTER — Emergency Department (HOSPITAL_COMMUNITY)
Admission: EM | Admit: 2013-03-21 | Discharge: 2013-03-21 | Disposition: A | Discharge status: Home / Self Care | Payer: Medicare Other | Attending: Emergency Medicine | Admitting: Emergency Medicine

## 2013-03-21 DIAGNOSIS — Z7982 Long term (current) use of aspirin: Secondary | ICD-10-CM | POA: Diagnosis not present

## 2013-03-21 DIAGNOSIS — Z8619 Personal history of other infectious and parasitic diseases: Secondary | ICD-10-CM | POA: Insufficient documentation

## 2013-03-21 DIAGNOSIS — M79609 Pain in unspecified limb: Secondary | ICD-10-CM | POA: Insufficient documentation

## 2013-03-21 DIAGNOSIS — Z79899 Other long term (current) drug therapy: Secondary | ICD-10-CM | POA: Diagnosis not present

## 2013-03-21 DIAGNOSIS — Z87891 Personal history of nicotine dependence: Secondary | ICD-10-CM | POA: Insufficient documentation

## 2013-03-21 DIAGNOSIS — M79622 Pain in left upper arm: Secondary | ICD-10-CM

## 2013-03-21 DIAGNOSIS — R079 Chest pain, unspecified: Secondary | ICD-10-CM | POA: Diagnosis not present

## 2013-03-21 DIAGNOSIS — I10 Essential (primary) hypertension: Secondary | ICD-10-CM | POA: Insufficient documentation

## 2013-03-21 LAB — BASIC METABOLIC PANEL
BUN: 10 mg/dL (ref 6–23)
CO2: 29 mEq/L (ref 19–32)
Calcium: 9.1 mg/dL (ref 8.4–10.5)
Chloride: 91 mEq/L — ABNORMAL LOW (ref 96–112)
Creatinine, Ser: 0.74 mg/dL (ref 0.50–1.35)
GFR calc Af Amer: >90 mL/min (ref >90)

## 2013-03-21 LAB — CBC
Hemoglobin: 16.2 g/dL (ref 13.0–17.0)
MCHC: 36.5 g/dL — ABNORMAL HIGH (ref 30.0–36.0)
Platelets: 334 10*3/uL (ref 150–400)
RBC: 4.55 MIL/uL (ref 4.22–5.81)

## 2013-03-21 MED ORDER — IBUPROFEN 800 MG PO TABS: 800.0000 mg | ORAL_TABLET | Freq: Three times a day (TID) | ORAL | Status: DC | PRN

## 2013-03-21 MED ORDER — ALENDRONATE SODIUM 70 MG PO TABS: 70.0000 mg | ORAL_TABLET | ORAL | Status: DC

## 2013-03-21 NOTE — ED Provider Notes (Signed)
Medical screening examination/treatment/procedure(s) were conducted as a shared visit with non-physician practitioner(s) and myself.  I personally evaluated the patient during the encounter   .Face to face Exam:  General:  A&Ox3 HEENT:  Atraumatic Resp:  Normal effort Abd:  Nondistended Neuro:No focal deficits    Nelia Shi, MD 03/21/13 1409

## 2013-03-21 NOTE — ED Notes (Signed)
Pt is here with pain that started under his left axilla area and then radiated around to left chest.  Pt states he has some pain with palpation.  No sob, no diaphoresis

## 2013-03-21 NOTE — ED Provider Notes (Signed)
History     CSN: [id number]  Arrival date & time 03/21/13  1105   First MD Initiated Contact with Patient 03/21/13 1134      Chief Complaint  Patient presents with  . Chest Pain    (Consider location/radiation/quality/duration/timing/severity/associated sxs/prior treatment) HPI Patient presents to the emergency department with left axillary pain.  Patient, states, that he does weight lifting.  He states, that on Saturday woke up with this sharp pain in his upper axillary region and the left.  Patient, states, that when he pressed on the area to make the pain, worse.  Pain, was constant.  Patient, states the pain seemed better yesterday.  The patient states he was began this morning with pain in the same area.  Patient denies shortness of breath, nausea, vomiting, abdominal pain, back pain, headache, blurred vision, weakness, numbness,  jaw pain, rash, or fever. Past Medical History  Diagnosis Date  . Hypertension   . Wears glasses   . Wears partial dentures   . Shingles 2005    Past Surgical History  Procedure Laterality Date  . Right inguinal herniorrhaphy  11/2004  . Tonsillectomy    . Colonoscopy  2010    Benign polyps, repeat in 5 years    No family history on file.  History  Substance Use Topics  . Smoking status: Former Games developer  . Smokeless tobacco: Never Used  . Alcohol Use: 8.4 oz/week    14 Cans of beer per week      Review of Systems All other systems negative except as documented in the HPI. All pertinent positives and negatives as reviewed in the HPI. Allergies  Review of patient's allergies indicates no known allergies.  Home Medications   Current Outpatient Rx  Name  Route  Sig  Dispense  Refill  . amLODipine (NORVASC) 5 MG tablet   Oral   Take 1 tablet (5 mg total) by mouth daily.   90 tablet   3   . aspirin 325 MG tablet   Oral   Take 325 mg by mouth daily. 1/2 tablet daily         . bisoprolol-hydrochlorothiazide (ZIAC) 5-6.25 MG per  tablet   Oral   Take 1 tablet by mouth daily.   90 tablet   3   . Cholecalciferol (VITAMIN D) 1000 UNITS capsule   Oral   Take 400 Units by mouth daily.          . fish oil-omega-3 fatty acids 1000 MG capsule   Oral   Take 2 g by mouth daily.           . Multiple Vitamins-Minerals (CENTRUM SILVER ULTRA MENS) TABS   Oral   Take 1 tablet by mouth daily.           Marland Kitchen oxymetazoline (AFRIN) 0.05 % nasal spray   Nasal   Place 3 sprays into the nose daily as needed for congestion.           BP 136/56  Pulse 58  Temp(Src) 97.8 F (36.6 C) (Oral)  Resp 17  SpO2 95%  Physical Exam  Nursing note and vitals reviewed. Constitutional: He appears well-developed and well-nourished. No distress.  HENT:  Head: Normocephalic and atraumatic.  Mouth/Throat: Oropharynx is clear and moist.  Eyes: Pupils are equal, round, and reactive to light.  Cardiovascular: Normal rate, regular rhythm and normal heart sounds.  Exam reveals no gallop and no friction rub.   No murmur heard. Pulmonary/Chest: Effort normal and breath  sounds normal. No respiratory distress. He has no wheezes. He has no rales.    Skin: Skin is warm and dry. No rash noted.    ED Course  Procedures (including critical care time)  Labs Reviewed  CBC - Abnormal; Notable for the following:    WBC 11.6 (*)    MCH 35.6 (*)    MCHC 36.5 (*)    All other components within normal limits  BASIC METABOLIC PANEL - Abnormal; Notable for the following:    Sodium 132 (*)    Chloride 91 (*)    Glucose, Bld 143 (*)    GFR calc non Af Amer 86 (*)    All other components within normal limits  POCT I-STAT TROPONIN I   Dg Chest 2 View  03/21/2013   *RADIOLOGY REPORT*  Clinical Data: Left lateral chest wall pain  CHEST - 2 VIEW  Comparison: None.  Findings: Normal mediastinum and cardiac silhouette.  Normal pulmonary  vasculature.  No evidence of effusion, infiltrate, or pneumothorax.  No acute bony abnormality. No acute osseous  abnormality.  IMPRESSION: No acute cardiopulmonary process.   Original Report Authenticated By: Genevive Bi, M.D.   This seems atypical for cardiac, chest pain, based on his history of present illness and physical exam.  Patient has a negative enzyme.  Patient has a negative chest x-ray.  Patient is PERC.  Patient is advised followup with primary care Dr. he will be given treatment for what appears to be chest wall pain.  Patient does work pretty heavily with weights.  Patient is advised to use ice and heat on the areas well   MDM  MDM Reviewed: nursing note, vitals and previous chart Interpretation: labs, ECG and x-ray            Carlyle Dolly, PA-C 03/21/13 1402

## 2013-03-29 ENCOUNTER — Encounter: Payer: Self-pay | Admitting: Internal Medicine

## 2013-07-22 DIAGNOSIS — Z23 Encounter for immunization: Secondary | ICD-10-CM | POA: Diagnosis not present

## 2013-10-04 DIAGNOSIS — H6120 Impacted cerumen, unspecified ear: Secondary | ICD-10-CM | POA: Diagnosis not present

## 2013-10-25 ENCOUNTER — Encounter: Payer: Self-pay | Admitting: Internal Medicine

## 2014-02-20 DIAGNOSIS — H6120 Impacted cerumen, unspecified ear: Secondary | ICD-10-CM | POA: Diagnosis not present

## 2014-02-20 DIAGNOSIS — H905 Unspecified sensorineural hearing loss: Secondary | ICD-10-CM | POA: Diagnosis not present

## 2014-03-17 ENCOUNTER — Other Ambulatory Visit: Payer: Self-pay | Admitting: Family Medicine

## 2014-03-17 ENCOUNTER — Ambulatory Visit (INDEPENDENT_AMBULATORY_CARE_PROVIDER_SITE_OTHER): Payer: Medicare Other | Admitting: Medical

## 2014-03-17 ENCOUNTER — Encounter: Payer: Self-pay | Admitting: Medical

## 2014-03-17 VITALS — BP 130/70 | HR 80 | Temp 97.7°F | Resp 14 | Ht 63.0 in | Wt 198.0 lb

## 2014-03-17 DIAGNOSIS — I10 Essential (primary) hypertension: Secondary | ICD-10-CM

## 2014-03-17 DIAGNOSIS — M899 Disorder of bone, unspecified: Secondary | ICD-10-CM

## 2014-03-17 DIAGNOSIS — Z23 Encounter for immunization: Secondary | ICD-10-CM

## 2014-03-17 DIAGNOSIS — M8580 Other specified disorders of bone density and structure, unspecified site: Secondary | ICD-10-CM

## 2014-03-17 DIAGNOSIS — E669 Obesity, unspecified: Secondary | ICD-10-CM

## 2014-03-17 DIAGNOSIS — L989 Disorder of the skin and subcutaneous tissue, unspecified: Secondary | ICD-10-CM

## 2014-03-17 DIAGNOSIS — R7301 Impaired fasting glucose: Secondary | ICD-10-CM | POA: Diagnosis not present

## 2014-03-17 DIAGNOSIS — M949 Disorder of cartilage, unspecified: Secondary | ICD-10-CM | POA: Diagnosis not present

## 2014-03-17 DIAGNOSIS — E871 Hypo-osmolality and hyponatremia: Secondary | ICD-10-CM

## 2014-03-17 LAB — CBC
HCT: 42.4 % (ref 39.0–52.0)
Hemoglobin: 15.1 g/dL (ref 13.0–17.0)
MCH: 35.6 pg — ABNORMAL HIGH (ref 26.0–34.0)
MCHC: 35.6 g/dL (ref 30.0–36.0)
MCV: 100 fL (ref 78.0–100.0)
PLATELETS: 345 10*3/uL (ref 150–400)
RBC: 4.24 MIL/uL (ref 4.22–5.81)
RDW: 12.6 % (ref 11.5–15.5)
WBC: 10.5 10*3/uL (ref 4.0–10.5)

## 2014-03-17 LAB — HEMOGLOBIN A1C
HEMOGLOBIN A1C: 5.7 % — AB (ref <5.7)
MEAN PLASMA GLUCOSE: 117 mg/dL — AB (ref <117)

## 2014-03-17 MED ORDER — BISOPROLOL-HYDROCHLOROTHIAZIDE 5-6.25 MG PO TABS: 1.0000 | ORAL_TABLET | Freq: Every day | ORAL | Status: DC

## 2014-03-17 MED ORDER — AMLODIPINE BESYLATE 5 MG PO TABS: 5.0000 mg | ORAL_TABLET | Freq: Every day | ORAL | Status: DC

## 2014-03-17 NOTE — Patient Instructions (Signed)
  Thank you for giving me the opportunity to serve you today.    Your diagnosis today includes: Encounter Diagnoses  Name Primary?  . Essential hypertension, benign Yes  . Hyponatremia   . Obesity, unspecified   . Low bone mass   . Need for pneumococcal vaccination   . Skin lesion of face      Specific recommendations today include:  Your eye doctor soon for routine followup  See your dentist soon for routine care  See Dr. Allyson Sabal soon for the 2 facial lesions  Continue routine exercise, preferably every day  Eat a healthy low-fat diet  Limit alcohol intake, consider cutting down to one beer a night  continue your current medications which have all been refilled  Return pending labs.

## 2014-03-17 NOTE — Addendum Note (Signed)
Addended by: Clyde Lundborg A on: 03/17/2014 09:25 AM   Modules accepted: Orders

## 2014-03-17 NOTE — Progress Notes (Signed)
Subjective:   HPI  Daniel Andersen is a 80 y.o. male who presents for a complete physical.  Preventative care: Last ophthalmology visit: years ago Last dental visit: year ago Last colonoscopy: 2010 Last prostate exam: ? Last EKG: 03/2013 Last labs:2014  Prior vaccinations: TD or Tdap:2010 Influenza: yearly Pneumococcal: vaccine few years ago Shingles/Zostavax: had shingles 2006   Concerns: Right wrist at times is painful with curls. Exercises regularly.   Drinks a few beers per night.  Reviewed their medical, surgical, family, social, medication, and allergy history and updated chart as appropriate.  Past Medical History  Diagnosis Date  . Hypertension   . Wears glasses   . Wears partial dentures   . Shingles 2005  . Low bone mass 03/2013    Bone Density scan    Past Surgical History  Procedure Laterality Date  . Right inguinal herniorrhaphy  11/2004  . Tonsillectomy    . Colonoscopy  2010    Benign polyps, repeat in 5 years    History   Social History  . Marital Status: Widowed    Spouse Name: N/A    Number of Children: N/A  . Years of Education: N/A   Occupational History  . retired, works part time - [employer]    Social History Main Topics  . Smoking status: Former Research scientist (life sciences)  . Smokeless tobacco: Never Used  . Alcohol Use: 8.4 oz/week    14 Cans of beer per week  . Drug Use: No  . Sexual Activity: Not on file     Comment: widowed; exercises daily with weights and aerobic   Other Topics Concern  . Not on file   Social History Narrative   Widowed, daughter in [place], exercising - gym every morning - cardio with treadmill, machine weights.   Works at [employer].    Travels up to his hometown in [place] occasionally    No family history on file.  Current outpatient prescriptions:alendronate (FOSAMAX) 70 MG tablet, Take 1 tablet (70 mg total) by mouth every 7 (seven) days. Take with a full glass of water on an empty  stomach., Disp: 4 tablet, Rfl: 11;  amLODipine (NORVASC) 5 MG tablet, Take 1 tablet (5 mg total) by mouth daily., Disp: 90 tablet, Rfl: 3;  aspirin 325 MG tablet, Take 325 mg by mouth daily. 1/2 tablet daily, Disp: , Rfl:  bisoprolol-hydrochlorothiazide (ZIAC) 5-6.25 MG per tablet, Take 1 tablet by mouth daily., Disp: 90 tablet, Rfl: 3;  Cholecalciferol (VITAMIN D) 1000 UNITS capsule, Take 400 Units by mouth daily. , Disp: , Rfl: ;  fish oil-omega-3 fatty acids 1000 MG capsule, Take 2 g by mouth daily.  , Disp: , Rfl: ;  Multiple Vitamins-Minerals (CENTRUM SILVER ULTRA MENS) TABS, Take 1 tablet by mouth daily.  , Disp: , Rfl:  ibuprofen (ADVIL,MOTRIN) 800 MG tablet, Take 1 tablet (800 mg total) by mouth every 8 (eight) hours as needed for pain., Disp: 21 tablet, Rfl: 0;  oxymetazoline (AFRIN) 0.05 % nasal spray, Place 3 sprays into the nose daily as needed for congestion., Disp: , Rfl:   No Known Allergies   Review of Systems Constitutional: -fever, -chills, -sweats, -unexpected weight change, -decreased appetite, -fatigue Allergy: -sneezing, -itching, -congestion Dermatology: -changing moles, --rash, -lumps ENT: -runny nose, -ear pain, -sore throat, -hoarseness, -sinus pain, -teeth pain, - ringing in ears, -hearing loss, -nosebleeds Cardiology: -chest pain, -palpitations, -swelling, -difficulty breathing when lying flat, -waking up short of breath Respiratory: -cough, -shortness of breath, -difficulty  breathing with exercise or exertion, -wheezing, -coughing up blood Gastroenterology: -abdominal pain, -nausea, -vomiting, -diarrhea, -constipation, -blood in stool, -changes in bowel movement, -difficulty swallowing or eating Hematology: -bleeding, -bruising  Musculoskeletal: -joint aches, -muscle aches, -joint swelling, -back pain, -neck pain, -cramping, -changes in gait Ophthalmology: denies vision changes, eye redness, itching, discharge Urology: -burning with urination, -difficulty urinating,  -blood in urine, -urinary frequency, -urgency, -incontinence Neurology: -headache, -weakness, -tingling, -numbness, -memory loss, -falls, -dizziness Psychology: -depressed mood, -agitation, -sleep problems     Objective:   Physical Exam  BP 130/70  Pulse 80  Temp(Src) 97.7 F (36.5 C) (Oral)  Resp 14  Ht 5\' 3"  (1.6 m)  Wt 198 lb (89.812 kg)  BMI 35.08 kg/m2  General appearance: alert, no distress, WD/WN, obese white male Skin: middle of forehead with 54mm slight raised flesh colored lesion, depression centrally, right nare with brown and pink raised pearly lesion, both suspicious for basal cell, othewrise scattered macules of torso, scattered seborrheic keratoses of back, no other worrisome lesions HEENT: normocephalic, conjunctiva/corneas normal, sclerae anicteric, PERRLA, EOMi, nares patent, no discharge or erythema, pharynx normal Oral cavity: MMM, tongue normal, teeth with upper and lower partial dentures Neck: supple, no lymphadenopathy, no thyromegaly, no masses, normal ROM, no bruits Chest: non tender, normal shape and expansion Heart: RRR, normal S1, S2, no murmurs Lungs: CTA bilaterally, no wheezes, rhonchi, or rales Abdomen: +bs, soft, diathesis recti, non tender, non distended, no masses, no hepatomegaly, no splenomegaly, no bruits Back: non tender, normal ROM, no scoliosis Musculoskeletal: upper extremities non tender, no obvious deformity, normal ROM throughout, lower extremities non tender, no obvious deformity, normal ROM throughout Extremities: 1+ ankle edema, otherwise no cyanosis, no clubbing Pulses: 2+ symmetric, upper and lower extremities, normal cap refill Neurological: alert, oriented x 3, CN2-12 intact, strength normal upper extremities and lower extremities, sensation normal throughout, DTRs 2+ throughout, no cerebellar signs, gait normal Psychiatric: normal affect, behavior normal, pleasant  GU/rectal - deferred  Assessment and Plan :      Encounter  Diagnoses  Name Primary?  . Essential hypertension, benign Yes  . Hyponatremia   . Obesity, unspecified   . Low bone mass   . Need for pneumococcal vaccination   . Skin lesion of face     Physical exam - discussed healthy lifestyle, diet, exercise, preventative care, vaccinations, and addressed their concerns.    Counseled on the pneumococcal vaccine.  Vaccine information sheet given.  Pneumococcal vaccine Prevnar 13 given after consent obtained.  Of note he has had prior PPSV23 vaccine  He has had shingles infection in 2006.    Specific recommendations today include:  Your eye doctor soon for routine followup  See your dentist soon for routine care  See Dr. Allyson Sabal soon for the 2 facial lesions  Continue routine exercise, preferably every day  Eat a healthy low-fat diet  Limit alcohol intake, consider cutting down to one beer a night  continue your current medications which have all been refilled  Follow-up pending labs

## 2014-03-18 LAB — COMPREHENSIVE METABOLIC PANEL
ALBUMIN: 3.6 g/dL (ref 3.5–5.2)
ALT: 21 U/L (ref 0–53)
AST: 24 U/L (ref 0–37)
Alkaline Phosphatase: 70 U/L (ref 39–117)
BILIRUBIN TOTAL: 0.7 mg/dL (ref 0.2–1.2)
BUN: 9 mg/dL (ref 6–23)
CALCIUM: 9.1 mg/dL (ref 8.4–10.5)
CHLORIDE: 94 meq/L — AB (ref 96–112)
CO2: 28 meq/L (ref 19–32)
Creat: 0.68 mg/dL (ref 0.50–1.35)
GLUCOSE: 98 mg/dL (ref 70–99)
POTASSIUM: 3.7 meq/L (ref 3.5–5.3)
SODIUM: 133 meq/L — AB (ref 135–145)
TOTAL PROTEIN: 6.9 g/dL (ref 6.0–8.3)

## 2014-03-18 LAB — LIPID PANEL
Cholesterol: 174 mg/dL (ref 0–200)
HDL: 66 mg/dL (ref >39)
LDL Cholesterol: 91 mg/dL (ref 0–99)
Total CHOL/HDL Ratio: 2.6 Ratio
Triglycerides: 84 mg/dL (ref <150)
VLDL: 17 mg/dL (ref 0–40)

## 2014-03-18 LAB — TSH: TSH: 1.606 u[IU]/mL (ref 0.350–4.500)

## 2014-03-20 DIAGNOSIS — L570 Actinic keratosis: Secondary | ICD-10-CM | POA: Diagnosis not present

## 2014-03-23 ENCOUNTER — Telehealth: Payer: Self-pay | Admitting: Medical

## 2014-03-23 ENCOUNTER — Encounter: Payer: Self-pay | Admitting: Family Medicine

## 2014-03-23 ENCOUNTER — Other Ambulatory Visit: Payer: Self-pay | Admitting: Family Medicine

## 2014-03-23 MED ORDER — ALENDRONATE SODIUM 70 MG PO TABS: 70.0000 mg | ORAL_TABLET | ORAL | Status: DC

## 2014-03-23 NOTE — Telephone Encounter (Signed)
I went over the patients lab results, I resent his Fosmax Rx and yes, he is to continue his calcium medication.He saw dermatology on 03/17/14. CLS

## 2014-05-01 ENCOUNTER — Other Ambulatory Visit: Payer: Self-pay | Admitting: Physician Assistant

## 2014-05-01 DIAGNOSIS — C44319 Basal cell carcinoma of skin of other parts of face: Secondary | ICD-10-CM | POA: Diagnosis not present

## 2014-05-03 ENCOUNTER — Encounter: Payer: Self-pay | Admitting: Internal Medicine

## 2014-06-12 ENCOUNTER — Other Ambulatory Visit: Payer: Self-pay | Admitting: Medical

## 2014-06-12 DIAGNOSIS — Z85828 Personal history of other malignant neoplasm of skin: Secondary | ICD-10-CM | POA: Diagnosis not present

## 2014-07-11 ENCOUNTER — Encounter: Payer: Self-pay | Admitting: Internal Medicine

## 2014-09-11 ENCOUNTER — Other Ambulatory Visit: Payer: Self-pay | Admitting: Medical

## 2014-09-11 DIAGNOSIS — L570 Actinic keratosis: Secondary | ICD-10-CM | POA: Diagnosis not present

## 2014-09-11 DIAGNOSIS — Z08 Encounter for follow-up examination after completed treatment for malignant neoplasm: Secondary | ICD-10-CM | POA: Diagnosis not present

## 2014-09-11 DIAGNOSIS — Z85828 Personal history of other malignant neoplasm of skin: Secondary | ICD-10-CM | POA: Diagnosis not present

## 2014-12-12 ENCOUNTER — Other Ambulatory Visit: Payer: Self-pay | Admitting: Medical

## 2015-01-02 DIAGNOSIS — H6123 Impacted cerumen, bilateral: Secondary | ICD-10-CM | POA: Diagnosis not present

## 2015-02-26 ENCOUNTER — Other Ambulatory Visit: Payer: Self-pay | Admitting: Medical

## 2015-02-26 NOTE — Telephone Encounter (Signed)
PATIENT NEEDS AN APPOINTMENT AS SOON AS POSSIBLE

## 2015-03-05 DIAGNOSIS — H9042 Sensorineural hearing loss, unilateral, left ear, with unrestricted hearing on the contralateral side: Secondary | ICD-10-CM | POA: Diagnosis not present

## 2015-03-12 ENCOUNTER — Other Ambulatory Visit: Payer: Self-pay | Admitting: Medical

## 2015-03-27 ENCOUNTER — Other Ambulatory Visit: Payer: Self-pay | Admitting: Medical

## 2015-04-02 ENCOUNTER — Encounter: Payer: Self-pay | Admitting: Medical

## 2015-04-02 ENCOUNTER — Ambulatory Visit (INDEPENDENT_AMBULATORY_CARE_PROVIDER_SITE_OTHER): Payer: Medicare Other | Admitting: Medical

## 2015-04-02 VITALS — BP 120/60 | HR 59 | Temp 98.2°F | Resp 14 | Ht 63.5 in | Wt 196.0 lb

## 2015-04-02 DIAGNOSIS — R7301 Impaired fasting glucose: Secondary | ICD-10-CM

## 2015-04-02 DIAGNOSIS — E669 Obesity, unspecified: Secondary | ICD-10-CM | POA: Diagnosis not present

## 2015-04-02 DIAGNOSIS — M7521 Bicipital tendinitis, right shoulder: Secondary | ICD-10-CM | POA: Diagnosis not present

## 2015-04-02 DIAGNOSIS — Z86010 Personal history of colonic polyps: Secondary | ICD-10-CM

## 2015-04-02 DIAGNOSIS — I10 Essential (primary) hypertension: Secondary | ICD-10-CM

## 2015-04-02 DIAGNOSIS — M8580 Other specified disorders of bone density and structure, unspecified site: Secondary | ICD-10-CM | POA: Insufficient documentation

## 2015-04-02 DIAGNOSIS — Z0000 Encounter for general adult medical examination without abnormal findings: Secondary | ICD-10-CM

## 2015-04-02 DIAGNOSIS — Z23 Encounter for immunization: Secondary | ICD-10-CM | POA: Diagnosis not present

## 2015-04-02 LAB — COMPREHENSIVE METABOLIC PANEL
ALBUMIN: 3.6 g/dL (ref 3.5–5.2)
ALT: 18 U/L (ref 0–53)
AST: 21 U/L (ref 0–37)
Alkaline Phosphatase: 59 U/L (ref 39–117)
BUN: 11 mg/dL (ref 6–23)
CALCIUM: 9.3 mg/dL (ref 8.4–10.5)
CHLORIDE: 98 meq/L (ref 96–112)
CO2: 27 meq/L (ref 19–32)
CREATININE: 0.82 mg/dL (ref 0.50–1.35)
Glucose, Bld: 99 mg/dL (ref 70–99)
POTASSIUM: 3.8 meq/L (ref 3.5–5.3)
SODIUM: 135 meq/L (ref 135–145)
Total Bilirubin: 0.8 mg/dL (ref 0.2–1.2)
Total Protein: 7 g/dL (ref 6.0–8.3)

## 2015-04-02 LAB — CBC
HCT: 41.9 % (ref 39.0–52.0)
Hemoglobin: 14.5 g/dL (ref 13.0–17.0)
MCH: 35.2 pg — AB (ref 26.0–34.0)
MCHC: 34.6 g/dL (ref 30.0–36.0)
MCV: 101.7 fL — AB (ref 78.0–100.0)
MPV: 9 fL (ref 8.6–12.4)
PLATELETS: 325 10*3/uL (ref 150–400)
RBC: 4.12 MIL/uL — AB (ref 4.22–5.81)
RDW: 12.8 % (ref 11.5–15.5)
WBC: 11.3 10*3/uL — ABNORMAL HIGH (ref 4.0–10.5)

## 2015-04-02 LAB — LIPID PANEL
CHOL/HDL RATIO: 2.4 ratio
Cholesterol: 168 mg/dL (ref 0–200)
HDL: 69 mg/dL (ref >=40)
LDL CALC: 82 mg/dL (ref 0–99)
Triglycerides: 84 mg/dL (ref <150)
VLDL: 17 mg/dL (ref 0–40)

## 2015-04-02 LAB — HEMOGLOBIN A1C
Hgb A1c MFr Bld: 5.7 % — ABNORMAL HIGH (ref <5.7)
Mean Plasma Glucose: 117 mg/dL — ABNORMAL HIGH (ref <117)

## 2015-04-02 MED ORDER — HYDROCODONE-ACETAMINOPHEN 2.5-325 MG PO TABS: 1.0000 | ORAL_TABLET | Freq: Four times a day (QID) | ORAL | Status: DC | PRN

## 2015-04-02 MED ORDER — ALENDRONATE SODIUM 70 MG PO TABS: 70.0000 mg | ORAL_TABLET | ORAL | Status: DC

## 2015-04-02 MED ORDER — BISOPROLOL-HYDROCHLOROTHIAZIDE 5-6.25 MG PO TABS: 1.0000 | ORAL_TABLET | Freq: Every day | ORAL | Status: DC

## 2015-04-02 MED ORDER — AMLODIPINE BESYLATE 5 MG PO TABS: 5.0000 mg | ORAL_TABLET | Freq: Every day | ORAL | Status: DC

## 2015-04-02 MED ORDER — ASPIRIN 325 MG PO TABS: ORAL_TABLET | ORAL | Status: DC

## 2015-04-02 MED ORDER — ASPIRIN 325 MG PO TABS: 325.0000 mg | ORAL_TABLET | Freq: Every day | ORAL | Status: DC

## 2015-04-02 NOTE — Progress Notes (Signed)
Subjective:   HPI  Daniel Andersen is a 81 y.o. male who presents for a complete physical/med check.  Medical care team includes:  Dorothea Ogle, PA-C here for primary care  Dr. Henrene Pastor, GI  Dermatology, Dr. Allyson Sabal  Dentist   Preventative care: Last ophthalmology visit: N/A Last dental visit: YES OFF OF [place] Last colonoscopy:11/2013 Last prostate exam: ? Last EKG:6 2014 Last labs:02/2014  Prior vaccinations: TD or Tdap:2010 Influenza:06/2014 Pneumococcal: # 13- 2015  Advanced directive: no Health care power of attorney:no Living will:no  Concerns: Shoulder pain - Friday at the gym while lifting weights pulled muscle in right shoulder.   Used some OTC Ibuprofen.   Not sure if any better.  Date of injury 03/30/2015 .   Reviewed their medical, surgical, family, social, medication, and allergy history and updated chart as appropriate.  Past Medical History  Diagnosis Date  . Hypertension   . Wears glasses   . Wears partial dentures   . Shingles 2005  . Low bone mass 03/2013    Bone Density scan    Past Surgical History  Procedure Laterality Date  . Right inguinal herniorrhaphy  11/2004  . Tonsillectomy    . Colonoscopy  2010    Benign polyps, repeat in 5 years    History   Social History  . Marital Status: Widowed    Spouse Name: N/A  . Number of Children: N/A  . Years of Education: N/A   Occupational History  . retired, works part time - [employer]    Social History Main Topics  . Smoking status: Former Research scientist (life sciences)  . Smokeless tobacco: Never Used  . Alcohol Use: 8.4 oz/week    14 Cans of beer per week  . Drug Use: No  . Sexual Activity: Not on file     Comment: widowed; exercises daily with weights and aerobic   Other Topics Concern  . Not on file   Social History Narrative   Widowed, daughter in [place], exercising - gym every morning - cardio with treadmill, machine weights.   Works at [employer].    Travels up to his  hometown in [place] occasionally    History reviewed. No pertinent family history.   Current outpatient prescriptions:  .  alendronate (FOSAMAX) 70 MG tablet, Take 1 tablet (70 mg total) by mouth once a week. Take with a full glass of water on an empty stomach., Disp: 12 tablet, Rfl: 3 .  amLODipine (NORVASC) 5 MG tablet, Take 1 tablet (5 mg total) by mouth daily., Disp: 90 tablet, Rfl: 3 .  aspirin 325 MG tablet, 1/2 tablet daily, Disp: 90 tablet, Rfl: 3 .  bisoprolol-hydrochlorothiazide (ZIAC) 5-6.25 MG per tablet, Take 1 tablet by mouth daily., Disp: 90 tablet, Rfl: 3 .  Cholecalciferol (VITAMIN D) 1000 UNITS capsule, Take 400 Units by mouth daily. , Disp: , Rfl:  .  fish oil-omega-3 fatty acids 1000 MG capsule, Take 2 g by mouth daily.  , Disp: , Rfl:  .  Multiple Vitamins-Minerals (CENTRUM SILVER ULTRA MENS) TABS, Take 1 tablet by mouth daily.  , Disp: , Rfl:  .  Hydrocodone-Acetaminophen 2.5-325 MG TABS, Take 1 tablet by mouth every 6 (six) hours as needed., Disp: 20 tablet, Rfl: 0 .  ibuprofen (ADVIL,MOTRIN) 800 MG tablet, Take 1 tablet (800 mg total) by mouth every 8 (eight) hours as needed for pain. (Patient not taking: Reported on 04/02/2015), Disp: 21 tablet, Rfl: 0  No Known Allergies   Reviewed nursing  depression and fall risk screening  Review of Systems Constitutional: -fever, -chills, -sweats, -unexpected weight change, -decreased appetite, -fatigue Allergy: -sneezing, -itching, -congestion Dermatology: -changing moles, --rash, -lumps ENT: -runny nose, -ear pain, -sore throat, -hoarseness, -sinus pain, -teeth pain, - ringing in ears, -hearing loss, -nosebleeds Cardiology: -chest pain, -palpitations, -swelling, -difficulty breathing when lying flat, -waking up short of breath Respiratory: -cough, -shortness of breath, -difficulty breathing with exercise or exertion, -wheezing, -coughing up blood Gastroenterology: -abdominal pain, -nausea, -vomiting, -diarrhea,  -constipation, -blood in stool, -changes in bowel movement, -difficulty swallowing or eating Hematology: -bleeding, -bruising  Musculoskeletal: -joint aches, -muscle aches, -joint swelling, -back pain, -neck pain, -cramping, -changes in gait, + shoulder pain Ophthalmology: denies vision changes, eye redness, itching, discharge Urology: -burning with urination, -difficulty urinating, -blood in urine, -urinary frequency, -urgency, -incontinence Neurology: -headache, -weakness, -tingling, -numbness, -memory loss, -falls, -dizziness Psychology: -depressed mood, -agitation, -sleep problems     Objective:   Physical Exam  BP 120/60 mmHg  Pulse 59  Temp(Src) 98.2 F (36.8 C) (Oral)  Resp 14  Ht 5' 3.5" (1.613 m)  Wt 196 lb (88.905 kg)  BMI 34.17 kg/m2  General appearance: alert, no distress, WD/WN, obese white male Skin:  scattered macules of torso, scattered seborrheic keratoses of back, no other worrisome lesions HEENT: normocephalic, conjunctiva/corneas normal, sclerae anicteric, PERRLA, EOMi, nares patent, no discharge or erythema, pharynx normal Oral cavity: MMM, tongue normal, teeth with upper and lower partial dentures Neck: supple, no lymphadenopathy, no thyromegaly, no masses, normal ROM, no bruits Chest: non tender, normal shape and expansion Heart: RRR, normal S1, S2, no murmurs Lungs: CTA bilaterally, no wheezes, rhonchi, or rales Abdomen: +bs, soft, diathesis recti, non tender, non distended, no masses, no hepatomegaly, no splenomegaly, no bruits Back: non tender, normal ROM, no scoliosis Musculoskeletal: tender over right biceps tendon, otherwise upper extremities non tender, no obvious deformity, normal ROM throughout, lower extremities non tender, no obvious deformity, normal ROM throughout Extremities: 1+ ankle edema, otherwise no cyanosis, no clubbing Pulses: 2+ symmetric, upper and lower extremities, normal cap refill Neurological: alert, oriented x 3, CN2-12 intact,  strength normal upper extremities and lower extremities, sensation normal throughout, DTRs 2+ throughout, no cerebellar signs, gait normal Psychiatric: normal affect, behavior normal, pleasant  GU: normal male circumcised, no hernia, no mass, no lymphadenopathy rectal - deferred   Assessment and Plan :    Encounter Diagnoses  Name Primary?  . Essential hypertension Yes  . Impaired fasting glucose   . History of colonic polyps   . Osteopenia   . Routine general medical examination at a health care facility   . Obesity   . Need for prophylactic vaccination against Streptococcus pneumoniae (pneumococcus)   . Biceps tendonitis, right     Physical exam - discussed healthy lifestyle, diet, exercise, preventative care, vaccinations, and addressed their concerns.    During the course of the visit the patient was educated and counseled about appropriate screening and preventive services including:   Counseled on the pneumococcal vaccine.  Vaccine information sheet given.  Pneumococcal PPSV 23 vaccine given after consent obtained. Advised yearly flu shot See your eye doctor yearly for routine vision care. See your dentist yearly for routine dental care including hygiene visits twice yearly. counseled on colonoscopy screening and polyps Counseled on living will and healthy care power of attorney, he will consider HTN - c/t current medications, labs today Impaired fasting glucose - labs today Osteopenia - c/t fosamax weekly, routine exercise Biceps tendonitis - advised short term  Aleve OTC,ice topically the next few days, hydrocodone for worse pain, limit lifting this week, discussed supportive measures, and f/u if not much resolved in a week obesity - c/t working on healthy diet and exercise, weight loss measures as discussed  Follow-up pending labs   Medicare Attestation I have personally reviewed: The patient's medical and social history Their use of alcohol, tobacco or illicit  drugs Their current medications and supplements The patient's functional ability including ADLs,fall risks, home safety risks, cognitive, and hearing and visual impairment Diet and physical activities Evidence for depression or mood disorders  The patient's weight, height, BMI, and visual acuity have been recorded in the chart.  I have made referrals, counseling, and provided education to the patient based on review of the above and I have provided the patient with a written personalized care plan for preventive services.     Crisoforo Oxford, PA-C   04/02/2015

## 2015-04-02 NOTE — Patient Instructions (Addendum)
MEDICARE PREVENTATIVE SERVICES (MALE) AND PERSONALIZED PLAN for  Daniel Andersen April 02, 2015  CONDITIONS OR RISKS IDENTIFIED TODAY: Right biceps tendonitis History of Colon polyp, need updated colon cancer screening/recheck on polyps Try using Aleve daily for the next week.  You can use Hydrocodone for worse pain.  Use ice for the next few days.  Follow up if not improving.    SPECIFIC RECOMMENDATIONS: See your eye doctor yearly for routine vision care. See your dentist yearly for routine dental care including hygiene visits twice yearly. See Dr. Allyson Sabal dermatology for updated skin surveillance  Influenza vaccine: get this yearly Pneumococcal vaccine: we updated your pneumococcal vaccine 23 today Shingles vaccine: I recommend you consider this through your local pharmacy Tdap vaccine: up to date Colonoscopy: let me know if you are agreeable to have colonoscopy.     Continue routine exercise. Eat a heart healthy diet.  Return pending labs    GENERAL RECOMMENDATIONS FOR GOOD HEALTH:  Supplements:  . Take a daily baby Aspirin 81mg  at bedtime for heart health unless you have a history of gastrointestinal bleed, allergy to aspirin, or are already taking higher dose Aspirin or other antiplatelet or blood thinner medication.   . Consume 1200 mg of Calcium daily through dietary calcium or supplement if you are male age 53 or older, or men 26 and older.   Men aged 71-70 should consume 1000 mg of Calcium daily. . Take 600 IU of Vitamin D daily.  Take 800 IU of Calcium daily if you are older than age 37.  . Take a general multivitamin daily.   Healthy diet: Eat a variety of foods, including fruits, vegetables, vegetable protein such as beans, lentils, tofu, and grains, such as rice.  Limit meat or animal protein, but if you eat meat, choose leans cuts such as chicken, fish, or [place].  Drink plenty of water daily.  Decrease saturated fat in the diet, avoid lots of red meat, processed foods,  sweets, fast foods, and fried foods.  Limit salt and caffeine intake.  Exercise: Aerobic exercise helps maintain good heart health. Weight bearing exercise helps keep bones and muscles working strong.  We recommend at least 30-40 minutes of exercise most days of the week.   Fall prevention: Falls are the leading cause of injuries, accidents, and accidental deaths in people over the age of 22. Falling is a real threat to your ability to live on your own.  Causes include poor eyesight or poor hearing, illness, poor lighting, throw rugs, clutter in your home, and medication side effects causing dizziness or balance problems.  Such medications can include medications for depression, sleep problems, high blood pressure, diabetes, and heart conditions.   PREVENTION  Be sure your home is as safe as possible. Here are some tips:  Wear shoes with non-skid soles (not house slippers).   Be sure your home and outside area are well lit.   Use night lights throughout your house, including hallways and stairways.   Remove clutter and clean up spills on floors and walkways.   Remove throw rugs or fasten them to the floor with carpet tape. Tack down carpet edges.   Do not place electrical cords across pathways.   Install grab bars in your bathtub, shower, and toilet area. Towel bars should not be used as a grab bar.   Install handrails on both sides of stairways.   Do not climb on stools or stepladders. Get someone else to help with jobs that require climbing.  Do not wax your floors at all, or use a non-skid wax.   Repair uneven or unsafe sidewalks, walkways or stairs.   Keep frequently used items within reach.   Be aware of pets so you do not trip.  Get regular check-ups from your doctor, and take good care of yourself:  Have your eyes checked every year for vision changes, cataracts, glaucoma, and other eye problems. Wear eyeglasses as directed.   Have your hearing checked every 2 years, or  anytime you or others think that you cannot hear well. Use hearing aids as directed.   See your caregiver if you have foot pain or corns. Sore feet can contribute to falls.   Let your caregiver know if a medicine is making you feel dizzy or making you lose your balance.   Use a cane, walker, or wheelchair as directed. Use walker or wheelchair brakes when getting in and out.   When you get up from bed, sit on the side of the bed for 1 to 2 minutes before you stand up. This will give your blood pressure time to adjust, and you will feel less dizzy.   If you need to go to the bathroom often, consider using a bedside commode.  Disease prevention:  If you smoke or chew tobacco, find out from your caregiver how to quit. It can literally save your life, no matter how long you have been a tobacco user. If you do not use tobacco, never begin. Medicare does cover some smoking cessation counseling.  Maintain a healthy diet and normal weight. Increased weight leads to problems with blood pressure and diabetes. We check your height, weight, and BMI as part of your yearly visit.  The Body Mass Index or BMI is a way of measuring how much of your body is fat. Having a BMI above 27 increases the risk of heart disease, diabetes, hypertension, stroke and other problems related to obesity. Your caregiver can help determine your BMI and based on it develop an exercise and dietary program to help you achieve or maintain this important measurement at a healthful level.  High blood pressure causes heart and blood vessel problems.  Persistent high blood pressure should be treated with medicine if weight loss and exercise do not work.  We check your blood pressure as part of your yearly visit.  Avoid drinking alcohol in excess (more than two drinks per day).  Avoid use of street drugs. Do not share needles with anyone. Ask for professional help if you need assistance or instructions on stopping the use of alcohol,  cigarettes, and/or drugs.  Brush your teeth twice a day with fluoride toothpaste, and floss once a day. Good oral hygiene prevents tooth decay and gum disease. The problems can be painful, unattractive, and can cause other health problems. Visit your dentist for a routine oral and dental checkup and preventive care every 6-12 months.   See your eye doctor yearly for routine screening for things like glaucoma.  Look at your skin regularly.  Use a mirror to look at your back. Notify your caregivers of changes in moles, especially if there are changes in shapes, colors, a size larger than a pencil eraser, an irregular border, or development of new moles.  Safety:  Use seatbelts 100% of the time, whether driving or as a passenger.  Use safety devices such as hearing protection if you work in environments with loud noise or significant background noise.  Use safety glasses when doing any work  that could send debris in to the eyes.  Use a helmet if you ride a bike or motorcycle.  Use appropriate safety gear for contact sports.  Talk to your caregiver about gun safety.  Use sunscreen with a SPF (or skin protection factor) of 15 or greater.  Lighter skinned people are at a greater risk of skin cancer. Don't forget to also wear sunglasses in order to protect your eyes from too much damaging sunlight. Damaging sunlight can accelerate cataract formation.   If you have multiple sexual partners, or if you are not in a monogamous relationship, practice safe sex. Use condoms. Condoms are used to help reduce the spread of sexually transmitted infections (or STIs).  Consider an HIV test if you have never been tested.  Consider routine screening for STIs if you have multiple sexual partners.   Keep carbon monoxide and smoke detectors in your home functioning at all times. Change the batteries every 6 months or use a model that plugs into the wall or is hard wired in.   END OF LIFE PLANNING/ADVANCED  DIRECTIVES Advance health-care planning is deciding the kind of care you want at the end of life. While alert competent adults are able to exercise their rights to make health care and financial decisions, problems arise when an individual becomes unconscious, incapacitated, or otherwise unable to communicate or make such decisions. Advance health care directives are the legal documents in which you give written instructions about your choices limited, aggressive or palliative care if, in the future, you cannot speak for yourself.  Advanced directives include the following: [place] allows you to appoint someone to act as your health care agent to make health care decisions for you should it be determined by your health care provider that you are no longer able to make these decisions for yourself.  A Living Will is a legal document in which you can declare that under certain conditions you desire your life not be prolonged by extraordinary or artificial means during your last illness or when you are near death. We can provide you with sample advanced directives, you can get an attorney to prepare these for you, or you can visit [place] Secretary of State's website for additional information and resources at [email or web address]  Further, I recommend you have an attorney prepare a Will and Durable Power of Attorney if you haven't done so already.  Please get [place] a copy of your health care Advanced Directives.   PREVENTATIV E CARE RECOMMENDATIONS:  Vaccinations: We recommend the following vaccinations as part of your preventative care:  Pneumococcal vaccine is recommended to protect against certain types of pneumonia.  This is normally recommended for adults age 14 or older once, or up to every 5 years for those at high risk.  The vaccine is also recommended for adults younger than 79 years old with certain underlying conditions that make them high risk for  pneumonia.  Influenza vaccine is recommended to protect against seasonal influenza or "the flu." Influenza is a serious disease that can lead to hospitalization and sometimes even death. Traditional flu vaccines (called trivalent vaccines) are made to protect against three flu viruses; an influenza A (H1N1) virus, an influenza A (H3N2) virus, and an influenza B virus. In addition, there are flu vaccines made to protect against four flu viruses (called "quadrivalent" vaccines). These vaccines protect against the same viruses as the trivalent vaccine and an additional B virus.  We recommend the high dose influenza  vaccine to those 48 years and older.  Hepatitis B vaccine to protect against a form of infection of the liver by a virus acquired from blood or body fluids, particularly for high risk groups.  Td or Tdap vaccine to protect against Tetanus, diphtheria and pertussis which can be very serious.  These diseases are caused by bacteria.  Diphtheria and pertussis are spread from person to person through coughing or sneezing.  Tetanus enters the body through cuts, scratches, or wounds.  Tetanus (Lockjaw) causes painful muscle tightening and stiffness, usually all over the body.  Diphtheria can cause a thick coating to form in the back of the throat.  It can lead to breathing problems, paralysis, heart failure, and death.  Pertussis (Whooping Cough) causes severe coughing spells, which can cause difficulty breathing, vomiting and disturbed sleep.  Td or Tdap is usually given every 10 years.  Shingles vaccine to protect against Varicella Zoster if you are older than age 39, or younger than 79 years old with certain underlying illness.    Cancer Screening: Most routine colon cancer screening begins at the age of 40.  Subsequent colonoscopies are performed either every 5-10 years for normal screening, or every 2-5 years for higher risks patients, up until age 47 years of age. Annual screening is done with  easy to use take-home tests to check for hidden blood in the stool called hemoccult tests.  Sigmoidoscopy or colonoscopy can detect the earliest forms of colon cancer and is life saving. These tests use a small camera at the end of a tube to directly examine the colon.   Prostate cancer screening usually begins at age 30 years old, or younger age for those with higher risk.  Those at higher risk include African-Americans or having a family history of prostate cancer. There are two types of tests for prostate cancer - Prostate-specific antigen (PSA) testing. Recent studies raise questions about prostate cancer using PSA and you should discuss this with your caregiver.  The other type of test is the digital rectal exam (in which your doctor's lubricated and gloved finger feels for enlargement of the prostate through the anus).  We routinely stop testing at age 82 years of age.  Osteoporosis Screening: Screening for osteoporosis usually begins at age 56 for women, and can be done as frequent as every 2 years.  However, women or men with higher risk of osteoporosis may be screened earlier than age 25.  Osteoporosis or low bone mass is diminished bone strength from alterations in bone architecture leading to bone fragility and increased fracture risk.     Cardiovascular Screening: Fat and cholesterol leaves deposits in your arteries that can block them. This causes heart disease and vessel disease elsewhere in your body.  If your cholesterol is found to be high, or if you have heart disease or certain other medical conditions, then you may need to have your cholesterol monitored frequently and be treated with medication. Cardiovascular screening in the form of lab tests for cholesterol, HDL and triglycerides can be done every 5 years.  A screening electrocardiogram can be done as part of the Welcome to Medicare physical.  Diabetes Screening: Diabetes screening can be done at least every 3 years for those with  risk factors,  or every 6-26months for prediabetic patients.  Screening includes fasting blood sugar test or glucose tolerance test.  Risk factors include hypertension, dyslipidemia, obesity, previously abnormal glucose tests, family history of diabetes, age 38 years or older, and history of  gestations diabetes.   AAA (abdominal aortic aneurysm) Screening: Medicare allows for a one time ultrasound to screen for abdominal aortic aneurysm if done as a referral as part of the Welcome to Medicare exam.  Men eligible for this screening include those men between age 68-19 years of age who have smoked at least 100 cigarettes in his lifetime and/or has a family history of AAA.  HIV Screening:  Medicare allows for yearly screening for patients at high risk for contracting HIV disease.

## 2015-04-03 LAB — MICROALBUMIN / CREATININE URINE RATIO
CREATININE, URINE: 172.5 mg/dL
MICROALB/CREAT RATIO: 8.7 mg/g (ref 0.0–30.0)
Microalb, Ur: 1.5 mg/dL (ref <2.0)

## 2015-07-13 DIAGNOSIS — Z23 Encounter for immunization: Secondary | ICD-10-CM | POA: Diagnosis not present

## 2015-07-16 ENCOUNTER — Encounter: Payer: Self-pay | Admitting: Internal Medicine

## 2015-12-17 DIAGNOSIS — H6123 Impacted cerumen, bilateral: Secondary | ICD-10-CM | POA: Diagnosis not present

## 2016-03-06 ENCOUNTER — Other Ambulatory Visit: Payer: Self-pay | Admitting: Medical

## 2016-05-06 ENCOUNTER — Ambulatory Visit (INDEPENDENT_AMBULATORY_CARE_PROVIDER_SITE_OTHER): Payer: Medicare Other | Admitting: Medical

## 2016-05-06 ENCOUNTER — Encounter: Payer: Self-pay | Admitting: Medical

## 2016-05-06 VITALS — BP 130/70 | HR 56 | Ht 63.5 in | Wt 197.0 lb

## 2016-05-06 DIAGNOSIS — E669 Obesity, unspecified: Secondary | ICD-10-CM

## 2016-05-06 DIAGNOSIS — Z7189 Other specified counseling: Secondary | ICD-10-CM | POA: Diagnosis not present

## 2016-05-06 DIAGNOSIS — R7301 Impaired fasting glucose: Secondary | ICD-10-CM

## 2016-05-06 DIAGNOSIS — Z0000 Encounter for general adult medical examination without abnormal findings: Secondary | ICD-10-CM | POA: Diagnosis not present

## 2016-05-06 DIAGNOSIS — Z7185 Encounter for immunization safety counseling: Secondary | ICD-10-CM | POA: Insufficient documentation

## 2016-05-06 DIAGNOSIS — I10 Essential (primary) hypertension: Secondary | ICD-10-CM | POA: Diagnosis not present

## 2016-05-06 DIAGNOSIS — M8580 Other specified disorders of bone density and structure, unspecified site: Secondary | ICD-10-CM | POA: Diagnosis not present

## 2016-05-06 DIAGNOSIS — L989 Disorder of the skin and subcutaneous tissue, unspecified: Secondary | ICD-10-CM | POA: Diagnosis not present

## 2016-05-06 DIAGNOSIS — Z86010 Personal history of colonic polyps: Secondary | ICD-10-CM

## 2016-05-06 LAB — CBC WITH DIFFERENTIAL/PLATELET
BASOS ABS: 124 {cells}/uL (ref 0–200)
BASOS PCT: 1 %
EOS ABS: 124 {cells}/uL (ref 15–500)
Eosinophils Relative: 1 %
HCT: 43.7 % (ref 38.5–50.0)
HEMOGLOBIN: 15.1 g/dL (ref 13.2–17.1)
LYMPHS ABS: 2480 {cells}/uL (ref 850–3900)
Lymphocytes Relative: 20 %
MCH: 36 pg — AB (ref 27.0–33.0)
MCHC: 34.6 g/dL (ref 32.0–36.0)
MCV: 104.3 fL — AB (ref 80.0–100.0)
MONO ABS: 1364 {cells}/uL — AB (ref 200–950)
MONOS PCT: 11 %
MPV: 9 fL (ref 7.5–12.5)
Neutro Abs: 8308 cells/uL — ABNORMAL HIGH (ref 1500–7800)
Neutrophils Relative %: 67 %
PLATELETS: 316 10*3/uL (ref 140–400)
RBC: 4.19 MIL/uL — ABNORMAL LOW (ref 4.20–5.80)
RDW: 12.4 % (ref 11.0–15.0)
WBC: 12.4 10*3/uL — ABNORMAL HIGH (ref 4.0–10.5)

## 2016-05-06 NOTE — Progress Notes (Signed)
Subjective:    Daniel Andersen is a 82 y.o. male who presents for Preventative Services visit and chronic medical problems/med check visit.    Primary Care Provider Dorothea Ogle, PA-C with Dr. Redmond School here for primary care  [place] Team  Eye doctor, Dr. Katy Fitch   Medical Services you may have received from other than [facility] providers in the past year (date may be approximate) No  Exercise Current exercise habits: Home exercise routine includes goes to [facility] and this week has been mainly at home.   Nutrition/Diet Current diet: well balanced  Depression Screen Depression screen PHQ 2/9 05/06/2016  Decreased Interest 1  Down, Depressed, Hopeless 0  PHQ - 2 Score 1    Activities of Daily Living Screen/Functional Status Survey Is the patient deaf or have difficulty hearing?: No Does the patient have difficulty seeing, even when wearing glasses/contacts?: No Does the patient have difficulty concentrating, remembering, or making decisions?: No Does the patient have difficulty walking or climbing stairs?: No Does the patient have difficulty dressing or bathing?: No Does the patient have difficulty doing errands alone such as visiting a doctor's office or shopping?: No  Can patient draw a clock face showing 3:15 o'clock, yes  Fall Risk Screen Fall Risk  05/06/2016 04/02/2015 03/17/2014  Falls in the past year? No No No    Gait Assessment: Normal gait observed yes  Advanced directives Does patient have a [place]? No Does patient have a Living Will? No  Past Medical History  Diagnosis Date  . Hypertension   . Wears glasses   . Wears partial dentures   . Shingles 2005  . Low bone mass 03/2013    Bone Density scan  . Obesity     Past Surgical History  Procedure Laterality Date  . Right inguinal herniorrhaphy  11/2004  . Tonsillectomy    . Colonoscopy  2010    Benign polyps, repeat in 5 years    Social History   Social History  .  Marital Status: Widowed    Spouse Name: N/A  . Number of Children: N/A  . Years of Education: N/A   Occupational History  . retired, works part time - [employer]    Social History Main Topics  . Smoking status: Former Research scientist (life sciences)  . Smokeless tobacco: Never Used  . Alcohol Use: 8.4 oz/week    14 Cans of beer per week  . Drug Use: No  . Sexual Activity: Not on file     Comment: widowed; exercises daily with weights and aerobic   Other Topics Concern  . Not on file   Social History Narrative   Widowed, daughter in [place], sees her few times per week, exercising - gym every morning - cardio with treadmill, machine weights.   Works at [employer].    Travels up to his hometown in [place] occasionally    History reviewed. No pertinent family history.   Current outpatient prescriptions:  .  alendronate (FOSAMAX) 70 MG tablet, TAKE 1 TABLET BY MOUTH ONCE A WEEK WITH A FULL GLASS OF WATER ON AN EMPTY STOMACH, Disp: 12 tablet, Rfl: 0 .  amLODipine (NORVASC) 5 MG tablet, Take 1 tablet (5 mg total) by mouth daily., Disp: 90 tablet, Rfl: 3 .  aspirin 325 MG tablet, 1/2 tablet daily, Disp: 90 tablet, Rfl: 3 .  bisoprolol-hydrochlorothiazide (ZIAC) 5-6.25 MG per tablet, Take 1 tablet by mouth daily., Disp: 90 tablet, Rfl: 3 .  Cholecalciferol (VITAMIN D) 1000 UNITS capsule, Take 400 Units by mouth daily. , Disp: , Rfl:  .  fish oil-omega-3 fatty acids 1000 MG capsule, Take 2 g by mouth daily.  , Disp: , Rfl:  .  Multiple Vitamins-Minerals (CENTRUM SILVER ULTRA MENS) TABS, Take 1 tablet by mouth daily.  , Disp: , Rfl:   No Known Allergies  History reviewed: allergies, current medications, past family history, past medical history, past social history, past surgical history and problem list  Chronic issues discussed: HTN - compliant with Amlodipine 5mg  daily, Bisoprolol HCTZ 5/6.25mg  daily  Osteopenia - compliant with Fosamax 70mg  weekly, Vit D  OTC  impaired glucose, obesity - Exercise - Monday, Tuesday, Thursday, Friday, every other Saturday, goes to [employer], still works at [employer] several hours daily  Delene Ruffini says he likes to chase girls.  Single, lives alone.    Widowed.  Acute issues discussed: none  Objective:      Biometrics BP 130/70 mmHg  Pulse 56  Ht 5' 3.5" (1.613 m)  Wt 197 lb (89.359 kg)  BMI 34.35 kg/m2  Wt Readings from Last 3 Encounters:  05/06/16 197 lb (89.359 kg)  04/02/15 196 lb (88.905 kg)  03/17/14 198 lb (89.812 kg)   BP Readings from Last 3 Encounters:  05/06/16 130/70  04/02/15 120/60  03/17/14 130/70    Cognitive Testing  Alert? Yes  Normal Appearance?Yes  Oriented to person? Yes  Place? Yes   Time? Yes  Recall of three objects?  Yes  Can perform simple calculations? Yes  Displays appropriate judgment?Yes  Can read the correct time from a watch face?Yes  General appearance: alert, no distress, WD/WN, white  male  Nutritional Status: Inadequate calore intake? no Loss of muscle mass? yes Loss of fat beneath skin? yes Localized or general edema? no Diminished functional status? no  General appearance: alert, no distress, WD/WN, obese white male Skin: scattered macules of torso, scattered seborrheic keratoses of back, left cheek raised pearly lesion HEENT: normocephalic, conjunctiva/corneas normal, sclerae anicteric, PERRLA, EOMi, nares patent, no discharge or erythema, pharynx normal Oral cavity: MMM, tongue normal, teeth with upper and lower partial dentures Neck: supple, no lymphadenopathy, no thyromegaly, no masses, normal ROM, no bruits Chest: non tender, normal shape and expansion Heart: RRR, normal S1, S2, no murmurs Lungs: CTA bilaterally, no wheezes, rhonchi, or rales Abdomen: +bs, soft, diathesis recti, non tender, non distended, no masses, no hepatomegaly, no splenomegaly, no bruits Back: non tender, normal ROM, no scoliosis Musculoskeletal:upper extremities  non tender, no obvious deformity, normal ROM throughout, lower extremities non tender, no obvious deformity, normal ROM throughout Extremities: 1+ ankle edema, otherwise no cyanosis, no clubbing Pulses: 2+ symmetric, upper and lower extremities, normal cap refill Neurological: alert, oriented x 3, CN2-12 intact, strength normal upper extremities and lower extremities, sensation normal throughout, DTRs 2+ throughout, no cerebellar signs, gait normal Psychiatric: normal affect, behavior normal, pleasant  GU: normal male circumcised, no hernia, no mass, no lymphadenopathy rectal - deferred   Adult ECG Report  Indication: hypertension  Rate: 55 bpm  Rhythm: sinus bradycardia  QRS Axis: 26 degrees  PR Interval: 228ms  QRS Duration: 143ms  QTc: 42ms  Conduction Disturbances: none  Other Abnormalities: none  Patient's cardiac risk factors are: advanced age (older than 84 for men, 73 for women), hypertension, male gender and obesity (BMI >= 30 kg/m2).  EKG comparison: 2014   Narrative Interpretation: no acute changes     Assessment:   Encounter Diagnoses  Name  Primary?  . Obesity Yes  . Impaired fasting glucose   . Osteopenia   . Essential hypertension   . History of colonic polyps      Plan:   A preventative services visit was completed today.  During the course of the visit today, we discussed and counseled about appropriate screening and preventive services.  A health risk assessment was established today that included a review of current medications, allergies, social history, family history, medical and preventative health history, biometrics, and preventative screenings to identify potential safety concerns or impairments.  A personalized plan was printed today for your records and use.   Personalized health advice and education was given today to reduce health risks and promote self management and wellness.  Information regarding end of life planning was discussed  today.  Conditions/risks identified: Skin issues - referral back to dermatology Advised less alcohol, weight loss  Chronic problems discussed today: hypertension - at goal, c/t same medication Obesity - work on efforts at weight loss through healthy diet, exercise, less beer Osteopenia - c/t vit D, fosamax, plan to repeat bone density 2018.  Acute problems discussed today: none  Recommendations:  I recommend a yearly ophthalmology/optometry visit for glaucoma screening and eye checkup  I recommended a yearly dental visit for hygiene and checkup  Advanced directives - discussed nature and purpose of Advanced Directives, encouraged them to complete them if they have not done so and/or encouraged them to get [place] a copy if they have done this already.  Your last colonoscopy was 2010. We will do a Cologuard  Your last bone density screen was 2014.  I recommend you have a repeat bone density screen next year  Referrals today: Cologuard  Immunizations: I recommended a yearly influenza vaccine, typically in September when the vaccine is usually available Is the Pneumococcal vaccine up to date: yes. Is the Shingles vaccine up to date: no, but s/p shingles infection.   Recommended he consider updated shingles booster. Is the Td/Tdap vaccine up to date: yes.   Medicare Attestation A preventative services visit was completed today.  During the course of the visit the patient was educated and counseled about appropriate screening and preventive services.  A health risk assessment was established with the patient that included a review of current medications, allergies, social history, family history, medical and preventative health history, biometrics, and preventative screenings to identify potential safety concerns or impairments.  A personalized plan was printed today for the patient's records and use.   Personalized health advice and education was given today to reduce health risks and  promote self management and wellness.  Information regarding end of life planning was discussed today.  Crisoforo Oxford, PA-C   05/06/2016

## 2016-05-06 NOTE — Patient Instructions (Signed)
Encounter Diagnoses  Name Primary?  . Medicare annual wellness visit, subsequent Yes  . Obesity   . Impaired fasting glucose   . Osteopenia   . Essential hypertension   . History of colonic polyps   . Vaccine counseling   . Skin lesion    Recommendations: See your eye doctor yearly for routine vision care. See your dentist yearly for routine dental care including hygiene visits twice yearly. We will plan to repeat bone density scan 2018 We will send kit for Cologuard to complete and return for colon cancer screen continue your current medications Work on losing weight, try to cut down some on beer Continue routine exercise We will refer you back to dermatology for skin concerns  I recommend a flu shot every fall  Consider getting shingles vaccine, but the cheaper option may be at [facility] or other local pharmacy.  This is a one time vaccine  Complete an Advanced Directives packet, notarize this, and get [place] a copy

## 2016-05-07 LAB — LIPID PANEL
CHOLESTEROL: 148 mg/dL (ref 125–200)
HDL: 71 mg/dL (ref >=40)
LDL Cholesterol: 56 mg/dL (ref <130)
TRIGLYCERIDES: 104 mg/dL (ref <150)
Total CHOL/HDL Ratio: 2.1 Ratio (ref <=5.0)
VLDL: 21 mg/dL (ref <30)

## 2016-05-07 LAB — COMPREHENSIVE METABOLIC PANEL
ALBUMIN: 3.7 g/dL (ref 3.6–5.1)
ALK PHOS: 68 U/L (ref 40–115)
ALT: 19 U/L (ref 9–46)
AST: 24 U/L (ref 10–35)
BILIRUBIN TOTAL: 0.8 mg/dL (ref 0.2–1.2)
BUN: 9 mg/dL (ref 7–25)
CHLORIDE: 95 mmol/L — AB (ref 98–110)
CO2: 29 mmol/L (ref 20–31)
CREATININE: 0.83 mg/dL (ref 0.70–1.11)
Calcium: 8.9 mg/dL (ref 8.6–10.3)
Glucose, Bld: 103 mg/dL — ABNORMAL HIGH (ref 65–99)
Potassium: 3.5 mmol/L (ref 3.5–5.3)
SODIUM: 133 mmol/L — AB (ref 135–146)
TOTAL PROTEIN: 7.2 g/dL (ref 6.1–8.1)

## 2016-05-07 LAB — MICROALBUMIN / CREATININE URINE RATIO
CREATININE, URINE: 129 mg/dL (ref 20–370)
MICROALB/CREAT RATIO: 15 ug/mg{creat} (ref <30)
Microalb, Ur: 1.9 mg/dL

## 2016-05-07 LAB — HEMOGLOBIN A1C
Hgb A1c MFr Bld: 5.5 % (ref <5.7)
Mean Plasma Glucose: 111 mg/dL

## 2016-05-07 LAB — VITAMIN D 25 HYDROXY (VIT D DEFICIENCY, FRACTURES): Vit D, 25-Hydroxy: 51 ng/mL (ref 30–100)

## 2016-05-07 NOTE — Addendum Note (Signed)
Addended by: Billie Lade on: 05/07/2016 12:24 PM   Modules accepted: Orders

## 2016-05-08 ENCOUNTER — Other Ambulatory Visit: Payer: Self-pay | Admitting: Medical

## 2016-05-08 MED ORDER — AMLODIPINE BESYLATE 5 MG PO TABS: 5.0000 mg | ORAL_TABLET | Freq: Every day | ORAL | Status: DC

## 2016-05-08 MED ORDER — BISOPROLOL-HYDROCHLOROTHIAZIDE 5-6.25 MG PO TABS: 1.0000 | ORAL_TABLET | Freq: Every day | ORAL | Status: DC

## 2016-05-08 MED ORDER — ALENDRONATE SODIUM 70 MG PO TABS: ORAL_TABLET | ORAL | Status: DC

## 2016-05-18 DIAGNOSIS — Z1211 Encounter for screening for malignant neoplasm of colon: Secondary | ICD-10-CM | POA: Diagnosis not present

## 2016-05-18 DIAGNOSIS — Z1212 Encounter for screening for malignant neoplasm of rectum: Secondary | ICD-10-CM | POA: Diagnosis not present

## 2016-05-20 LAB — COLOGUARD: Cologuard: NEGATIVE

## 2016-06-01 ENCOUNTER — Telehealth: Payer: Self-pay | Admitting: Medical

## 2016-06-01 NOTE — Telephone Encounter (Signed)
cologard results is negative for blood or worrisome cells.

## 2016-06-02 ENCOUNTER — Encounter: Payer: Self-pay | Admitting: Family Medicine

## 2016-06-02 NOTE — Telephone Encounter (Signed)
LMTCB

## 2016-06-04 NOTE — Telephone Encounter (Signed)
error 

## 2016-06-05 NOTE — Telephone Encounter (Signed)
Pt is aware.  

## 2016-06-08 ENCOUNTER — Other Ambulatory Visit: Payer: Self-pay | Admitting: Medical

## 2016-07-18 DIAGNOSIS — Z23 Encounter for immunization: Secondary | ICD-10-CM | POA: Diagnosis not present

## 2016-12-15 DIAGNOSIS — H6123 Impacted cerumen, bilateral: Secondary | ICD-10-CM | POA: Diagnosis not present

## 2017-04-28 ENCOUNTER — Other Ambulatory Visit: Payer: Self-pay | Admitting: Medical

## 2017-05-18 ENCOUNTER — Emergency Department (HOSPITAL_COMMUNITY): Payer: Medicare Other

## 2017-05-18 ENCOUNTER — Encounter (HOSPITAL_COMMUNITY): Payer: Self-pay | Admitting: Family Medicine

## 2017-05-18 ENCOUNTER — Inpatient Hospital Stay (HOSPITAL_COMMUNITY)
Admission: EM | Admit: 2017-05-18 | Discharge: 2017-05-21 | DRG: 501 | Disposition: A | Discharge status: Skilled Nursing Facility | Payer: Medicare Other | Attending: Family Medicine | Admitting: Family Medicine

## 2017-05-18 DIAGNOSIS — S76111A Strain of right quadriceps muscle, fascia and tendon, initial encounter: Secondary | ICD-10-CM | POA: Diagnosis not present

## 2017-05-18 DIAGNOSIS — E669 Obesity, unspecified: Secondary | ICD-10-CM | POA: Diagnosis present

## 2017-05-18 DIAGNOSIS — R262 Difficulty in walking, not elsewhere classified: Secondary | ICD-10-CM | POA: Diagnosis not present

## 2017-05-18 DIAGNOSIS — M8580 Other specified disorders of bone density and structure, unspecified site: Secondary | ICD-10-CM | POA: Diagnosis not present

## 2017-05-18 DIAGNOSIS — M25561 Pain in right knee: Secondary | ICD-10-CM | POA: Diagnosis not present

## 2017-05-18 DIAGNOSIS — Z6830 Body mass index (BMI) 30.0-30.9, adult: Secondary | ICD-10-CM | POA: Diagnosis not present

## 2017-05-18 DIAGNOSIS — M25461 Effusion, right knee: Secondary | ICD-10-CM | POA: Diagnosis not present

## 2017-05-18 DIAGNOSIS — Z01818 Encounter for other preprocedural examination: Secondary | ICD-10-CM | POA: Diagnosis not present

## 2017-05-18 DIAGNOSIS — D62 Acute posthemorrhagic anemia: Secondary | ICD-10-CM | POA: Diagnosis not present

## 2017-05-18 DIAGNOSIS — Z111 Encounter for screening for respiratory tuberculosis: Secondary | ICD-10-CM | POA: Diagnosis not present

## 2017-05-18 DIAGNOSIS — D72829 Elevated white blood cell count, unspecified: Secondary | ICD-10-CM | POA: Diagnosis not present

## 2017-05-18 DIAGNOSIS — S82091D Other fracture of right patella, subsequent encounter for closed fracture with routine healing: Secondary | ICD-10-CM | POA: Diagnosis not present

## 2017-05-18 DIAGNOSIS — S90111A Contusion of right great toe without damage to nail, initial encounter: Secondary | ICD-10-CM | POA: Diagnosis not present

## 2017-05-18 DIAGNOSIS — R110 Nausea: Secondary | ICD-10-CM | POA: Diagnosis not present

## 2017-05-18 DIAGNOSIS — E876 Hypokalemia: Secondary | ICD-10-CM | POA: Diagnosis not present

## 2017-05-18 DIAGNOSIS — M6281 Muscle weakness (generalized): Secondary | ICD-10-CM | POA: Diagnosis not present

## 2017-05-18 DIAGNOSIS — E569 Vitamin deficiency, unspecified: Secondary | ICD-10-CM | POA: Diagnosis not present

## 2017-05-18 DIAGNOSIS — Z7983 Long term (current) use of bisphosphonates: Secondary | ICD-10-CM | POA: Diagnosis not present

## 2017-05-18 DIAGNOSIS — S92411D Displaced fracture of proximal phalanx of right great toe, subsequent encounter for fracture with routine healing: Secondary | ICD-10-CM | POA: Diagnosis not present

## 2017-05-18 DIAGNOSIS — S92411A Displaced fracture of proximal phalanx of right great toe, initial encounter for closed fracture: Secondary | ICD-10-CM

## 2017-05-18 DIAGNOSIS — S4991XA Unspecified injury of right shoulder and upper arm, initial encounter: Secondary | ICD-10-CM | POA: Diagnosis not present

## 2017-05-18 DIAGNOSIS — S76111D Strain of right quadriceps muscle, fascia and tendon, subsequent encounter: Secondary | ICD-10-CM | POA: Diagnosis not present

## 2017-05-18 DIAGNOSIS — M25511 Pain in right shoulder: Secondary | ICD-10-CM | POA: Diagnosis present

## 2017-05-18 DIAGNOSIS — S82001A Unspecified fracture of right patella, initial encounter for closed fracture: Secondary | ICD-10-CM | POA: Diagnosis not present

## 2017-05-18 DIAGNOSIS — W010XXA Fall on same level from slipping, tripping and stumbling without subsequent striking against object, initial encounter: Secondary | ICD-10-CM | POA: Diagnosis present

## 2017-05-18 DIAGNOSIS — Z972 Presence of dental prosthetic device (complete) (partial): Secondary | ICD-10-CM | POA: Diagnosis not present

## 2017-05-18 DIAGNOSIS — Z87891 Personal history of nicotine dependence: Secondary | ICD-10-CM | POA: Diagnosis not present

## 2017-05-18 DIAGNOSIS — S82001D Unspecified fracture of right patella, subsequent encounter for closed fracture with routine healing: Secondary | ICD-10-CM | POA: Diagnosis not present

## 2017-05-18 DIAGNOSIS — I10 Essential (primary) hypertension: Secondary | ICD-10-CM | POA: Diagnosis present

## 2017-05-18 DIAGNOSIS — R52 Pain, unspecified: Secondary | ICD-10-CM | POA: Diagnosis not present

## 2017-05-18 DIAGNOSIS — Z973 Presence of spectacles and contact lenses: Secondary | ICD-10-CM

## 2017-05-18 DIAGNOSIS — S8990XA Unspecified injury of unspecified lower leg, initial encounter: Secondary | ICD-10-CM | POA: Diagnosis not present

## 2017-05-18 DIAGNOSIS — G8911 Acute pain due to trauma: Secondary | ICD-10-CM | POA: Diagnosis not present

## 2017-05-18 DIAGNOSIS — M62838 Other muscle spasm: Secondary | ICD-10-CM | POA: Diagnosis not present

## 2017-05-18 DIAGNOSIS — I493 Ventricular premature depolarization: Secondary | ICD-10-CM | POA: Diagnosis not present

## 2017-05-18 DIAGNOSIS — R7301 Impaired fasting glucose: Secondary | ICD-10-CM | POA: Diagnosis not present

## 2017-05-18 DIAGNOSIS — S92403A Displaced unspecified fracture of unspecified great toe, initial encounter for closed fracture: Secondary | ICD-10-CM | POA: Diagnosis present

## 2017-05-18 DIAGNOSIS — R278 Other lack of coordination: Secondary | ICD-10-CM | POA: Diagnosis not present

## 2017-05-18 DIAGNOSIS — Z7982 Long term (current) use of aspirin: Secondary | ICD-10-CM | POA: Diagnosis not present

## 2017-05-18 DIAGNOSIS — Z9181 History of falling: Secondary | ICD-10-CM | POA: Diagnosis not present

## 2017-05-18 DIAGNOSIS — R1110 Vomiting, unspecified: Secondary | ICD-10-CM | POA: Diagnosis not present

## 2017-05-18 DIAGNOSIS — T148XXA Other injury of unspecified body region, initial encounter: Secondary | ICD-10-CM | POA: Diagnosis not present

## 2017-05-18 DIAGNOSIS — K5900 Constipation, unspecified: Secondary | ICD-10-CM | POA: Diagnosis not present

## 2017-05-18 DIAGNOSIS — R9431 Abnormal electrocardiogram [ECG] [EKG]: Secondary | ICD-10-CM | POA: Diagnosis not present

## 2017-05-18 DIAGNOSIS — K219 Gastro-esophageal reflux disease without esophagitis: Secondary | ICD-10-CM | POA: Diagnosis not present

## 2017-05-18 DIAGNOSIS — S82091A Other fracture of right patella, initial encounter for closed fracture: Secondary | ICD-10-CM | POA: Diagnosis not present

## 2017-05-18 DIAGNOSIS — Z79899 Other long term (current) drug therapy: Secondary | ICD-10-CM | POA: Diagnosis not present

## 2017-05-18 LAB — CBC
HCT: 31.4 % — ABNORMAL LOW (ref 39.0–52.0)
Hemoglobin: 11.1 g/dL — ABNORMAL LOW (ref 13.0–17.0)
MCH: 35.5 pg — ABNORMAL HIGH (ref 26.0–34.0)
MCHC: 35.4 g/dL (ref 30.0–36.0)
MCV: 100.3 fL — ABNORMAL HIGH (ref 78.0–100.0)
PLATELETS: 269 10*3/uL (ref 150–400)
RBC: 3.13 MIL/uL — AB (ref 4.22–5.81)
RDW: 12.2 % (ref 11.5–15.5)
WBC: 11.6 10*3/uL — ABNORMAL HIGH (ref 4.0–10.5)

## 2017-05-18 LAB — COMPREHENSIVE METABOLIC PANEL
ALK PHOS: 54 U/L (ref 38–126)
ALT: 23 U/L (ref 17–63)
AST: 39 U/L (ref 15–41)
Albumin: 3.1 g/dL — ABNORMAL LOW (ref 3.5–5.0)
Anion gap: 9 (ref 5–15)
BUN: 16 mg/dL (ref 6–20)
CALCIUM: 8.8 mg/dL — AB (ref 8.9–10.3)
CO2: 27 mmol/L (ref 22–32)
CREATININE: 0.94 mg/dL (ref 0.61–1.24)
Chloride: 99 mmol/L — ABNORMAL LOW (ref 101–111)
Glucose, Bld: 137 mg/dL — ABNORMAL HIGH (ref 65–99)
Potassium: 3.4 mmol/L — ABNORMAL LOW (ref 3.5–5.1)
Sodium: 135 mmol/L (ref 135–145)
Total Bilirubin: 1.1 mg/dL (ref 0.3–1.2)
Total Protein: 6.1 g/dL — ABNORMAL LOW (ref 6.5–8.1)

## 2017-05-18 LAB — CBC WITH DIFFERENTIAL/PLATELET
Basophils Absolute: 0 10*3/uL (ref 0.0–0.1)
Basophils Relative: 0 %
EOS PCT: 0 %
Eosinophils Absolute: 0 10*3/uL (ref 0.0–0.7)
HEMATOCRIT: 31.3 % — AB (ref 39.0–52.0)
HEMOGLOBIN: 11.3 g/dL — AB (ref 13.0–17.0)
LYMPHS ABS: 2.5 10*3/uL (ref 0.7–4.0)
LYMPHS PCT: 17 %
MCH: 35.1 pg — AB (ref 26.0–34.0)
MCHC: 36.1 g/dL — ABNORMAL HIGH (ref 30.0–36.0)
MCV: 97.2 fL (ref 78.0–100.0)
Monocytes Absolute: 1.5 10*3/uL — ABNORMAL HIGH (ref 0.1–1.0)
Monocytes Relative: 10 %
Neutro Abs: 10.3 10*3/uL — ABNORMAL HIGH (ref 1.7–7.7)
Neutrophils Relative %: 73 %
PLATELETS: 265 10*3/uL (ref 150–400)
RBC: 3.22 MIL/uL — AB (ref 4.22–5.81)
RDW: 11.8 % (ref 11.5–15.5)
WBC: 14.3 10*3/uL — AB (ref 4.0–10.5)

## 2017-05-18 LAB — CREATININE, SERUM
CREATININE: 1.29 mg/dL — AB (ref 0.61–1.24)
GFR calc non Af Amer: 50 mL/min — ABNORMAL LOW (ref >60)
GFR, EST AFRICAN AMERICAN: 57 mL/min — AB (ref >60)

## 2017-05-18 MED ORDER — OXYCODONE HCL 5 MG PO TABS
5.0000 mg | ORAL_TABLET | ORAL | Status: DC | PRN
  Administered 2017-05-19: 5 mg via ORAL
  Filled 2017-05-18: qty 1

## 2017-05-18 MED ORDER — SENNOSIDES-DOCUSATE SODIUM 8.6-50 MG PO TABS: 1.0000 | ORAL_TABLET | Freq: Every evening | ORAL | Status: DC | PRN

## 2017-05-18 MED ORDER — ONDANSETRON HCL 4 MG PO TABS: 4.0000 mg | ORAL_TABLET | Freq: Four times a day (QID) | ORAL | Status: DC | PRN

## 2017-05-18 MED ORDER — OXYCODONE HCL 5 MG PO TABS: 5.0000 mg | ORAL_TABLET | ORAL | 0 refills | Status: DC | PRN

## 2017-05-18 MED ORDER — HEPARIN SODIUM (PORCINE) 5000 UNIT/ML IJ SOLN
5000.0000 [IU] | Freq: Three times a day (TID) | INTRAMUSCULAR | Status: DC
  Administered 2017-05-18 (×2): 5000 [IU] via SUBCUTANEOUS
  Filled 2017-05-18 (×2): qty 1

## 2017-05-18 MED ORDER — ONDANSETRON HCL 4 MG/2ML IJ SOLN: 4.0000 mg | Freq: Four times a day (QID) | INTRAMUSCULAR | Status: DC | PRN

## 2017-05-18 MED ORDER — IBUPROFEN 600 MG PO TABS
600.0000 mg | ORAL_TABLET | Freq: Four times a day (QID) | ORAL | Status: DC | PRN
  Administered 2017-05-18: 600 mg via ORAL
  Filled 2017-05-18: qty 1

## 2017-05-18 MED ORDER — ACETAMINOPHEN 500 MG PO TABS
1000.0000 mg | ORAL_TABLET | Freq: Once | ORAL | Status: AC
  Administered 2017-05-18: 1000 mg via ORAL
  Filled 2017-05-18: qty 2

## 2017-05-18 MED ORDER — ACETAMINOPHEN 325 MG PO TABS: 650.0000 mg | ORAL_TABLET | Freq: Four times a day (QID) | ORAL | Status: DC | PRN

## 2017-05-18 MED ORDER — BISOPROLOL-HYDROCHLOROTHIAZIDE 5-6.25 MG PO TABS
1.0000 | ORAL_TABLET | Freq: Every day | ORAL | Status: DC
  Administered 2017-05-19: 1 via ORAL
  Filled 2017-05-18: qty 1

## 2017-05-18 MED ORDER — VITAMIN D 1000 UNITS PO TABS
1000.0000 [IU] | ORAL_TABLET | Freq: Every day | ORAL | Status: DC
  Administered 2017-05-18 – 2017-05-21 (×4): 1000 [IU] via ORAL
  Filled 2017-05-18 (×4): qty 1

## 2017-05-18 MED ORDER — IBUPROFEN 200 MG PO TABS
400.0000 mg | ORAL_TABLET | Freq: Once | ORAL | Status: AC
  Administered 2017-05-18: 400 mg via ORAL
  Filled 2017-05-18: qty 2

## 2017-05-18 MED ORDER — AMLODIPINE BESYLATE 5 MG PO TABS
5.0000 mg | ORAL_TABLET | Freq: Every day | ORAL | Status: DC
  Administered 2017-05-19 – 2017-05-21 (×3): 5 mg via ORAL
  Filled 2017-05-18 (×3): qty 1

## 2017-05-18 MED ORDER — ZOLPIDEM TARTRATE 5 MG PO TABS
5.0000 mg | ORAL_TABLET | Freq: Once | ORAL | Status: AC
  Administered 2017-05-18: 5 mg via ORAL
  Filled 2017-05-18: qty 1

## 2017-05-18 MED ORDER — VITAMIN D 1000 UNITS PO CAPS: 1000.0000 [IU] | ORAL_CAPSULE | Freq: Every day | ORAL | Status: DC

## 2017-05-18 MED ORDER — SODIUM CHLORIDE 0.9% FLUSH
3.0000 mL | Freq: Two times a day (BID) | INTRAVENOUS | Status: DC
  Administered 2017-05-18 – 2017-05-19 (×2): 3 mL via INTRAVENOUS

## 2017-05-18 MED ORDER — ACETAMINOPHEN 650 MG RE SUPP: 650.0000 mg | Freq: Four times a day (QID) | RECTAL | Status: DC | PRN

## 2017-05-18 NOTE — Progress Notes (Addendum)
PT Cancellation Note  Patient Details Name: Daniel Andersen MRN: [id number] DOB: 1932/06/03   Cancelled Treatment:    Reason Eval/Treat Not Completed: Other (comment) (per RN, pt will soon transfer to [facility] today for surgery tomorrow. RN reports no immediate PT needs.  At present Pt has no weight bearing status, no ortho consult in chart, so will hold PT until after surgery. )   Philomena Doheny 05/18/2017, 2:07 PM [phone]

## 2017-05-18 NOTE — H&P (Signed)
History and Physical   Daniel Andersen KGM:[id number] DOB: 1932/02/15 DOA: 05/18/2017  Referring MD/NP/PA: Gareth Morgan, MD, EDP PCP: Denita Lung, MD Outpatient Specialists: None reported  Patient coming from: Home  Chief Complaint: Right knee, shoulder and foot pain.   HPI: Daniel Andersen is an 83 y.o. ambidextrous male with a history of HTN and osteopenia who presented to the ED 7/30 for right knee, shoulder and foot pain following a fall the prior day. He was at a party, when he tripped over a dog, landing directly on the right knee and striking a refrigerator with the right shoulder/arm. He had immediate constant severe nonradiating right knee pain. He has moderate right shoulder pain and right foot pain only with ambulation improved with NSAIDs and ice, though the knee continued to swell so he came for evaluation. On arrival he was in no distress with inability to extend right knee, and imaging demonstrates a patellar avulsion fracture of the right knee. Right foot XR also demonstrated intraarticular fracture of proximal great toe phalanx, and right shoulder XR was negative. Orthopedics consulted and recommended admission, transfer to [facility] for surgical management the following day.   He works out on the treadmill and with weights most mornings, has no dyspnea with exertion. Has had no reactions to anesthesia in the past.   Review of Systems: Denies fever, chills, weight loss, changes in vision or hearing, headache, cough, sore throat, chest pain, palpitations, shortness of breath, abdominal pain, nausea, vomiting, changes in bowel habits, blood in stool, change in bladder habits, myalgias, arthralgias, and rash. All others reviewed and are negative.   Past Medical History:  Diagnosis Date  . Hypertension   . Low bone mass 03/2013   Bone Density scan  . Obesity   . Shingles 2005  . Wears glasses   . Wears partial dentures    Past Surgical History:  Procedure Laterality Date  .  Colonoscopy  2010   Benign polyps, repeat in 5 years  . right inguinal herniorrhaphy  11/2004  . TONSILLECTOMY     - Nonsmoker who drinks alcohol only occasionally.   reports that he has quit smoking. He has never used smokeless tobacco. He reports that he drinks about 8.4 oz of alcohol per week . He reports that he does not use drugs. No Known Allergies No family history on file. - Family history otherwise reviewed and not pertinent.  Prior to Admission medications   Medication Sig Start Date End Date Taking? Authorizing Provider  alendronate (FOSAMAX) 70 MG tablet TAKE 1 TABLET BY MOUTH ONCE A WEEK WITH A FULL GLASS OF WATER ON AN EMPTY STOMACH 04/28/17  Yes Tysinger, Camelia Eng, PA-C  amLODipine (NORVASC) 5 MG tablet TAKE 1 TABLET BY MOUTH EVERY DAY 06/09/16  Yes Tysinger, Camelia Eng, PA-C  aspirin 325 MG tablet 1/2 tablet daily 04/02/15  Yes Tysinger, Camelia Eng, PA-C  bisoprolol-hydrochlorothiazide (ZIAC) 5-6.25 MG tablet TAKE 1 TABLET BY MOUTH EVERY DAY 06/09/16  Yes Tysinger, Camelia Eng, PA-C  Cholecalciferol (VITAMIN D) 1000 UNITS capsule Take 1,000 Units by mouth daily.    Yes [provider]  fish oil-omega-3 fatty acids 1000 MG capsule Take 1 g by mouth daily.    Yes [provider]  Multiple Vitamins-Minerals (CENTRUM SILVER ULTRA MENS) TABS Take 1 tablet by mouth daily.     Yes [provider]  oxyCODONE (ROXICODONE) 5 MG immediate release tablet Take 1 tablet (5 mg total) by mouth every 4 (four) hours as  needed for severe pain. 05/18/17   Gareth Morgan, MD    Physical Exam: Vitals:   05/18/17 1030 05/18/17 1131 05/18/17 1132 05/18/17 1200  BP:  (!) 134/57 (!) 134/57 (!) 137/59  Pulse: 69 64 61 63  Resp:   14 14  Temp:      TempSrc:      SpO2: 96% 95% 94% 94%   Constitutional: 83 y.o. male in no distress, calm demeanor Eyes: Lids and conjunctivae normal, PERRL ENMT: Mucous membranes are moist. Posterior pharynx clear of any exudate or lesions. Poor  dentition.  Neck: normal, supple, no masses, no thyromegaly Respiratory: Non-labored breathing without accessory muscle use. Clear breath sounds to auscultation bilaterally Cardiovascular: Regular rate and rhythm, no murmurs, rubs, or gallops. No carotid bruits. No JVD. No LE edema. Abdomen: Normoactive bowel sounds. No tenderness, non-distended, and no masses palpated. No hepatosplenomegaly. GU: No indwelling catheter Musculoskeletal: Right knee in extension splint, 2+ DP pulses, strength 5/5, sensory exam normal, right toes cap refill brisk. Right shoulder with restricted AROM. Skin: Warm, dry. No rashes, wounds, no ulcers. No significant lesions noted.  Neurologic: CN II-XII grossly intact. Gait not assessed. Speech normal. No focal deficits in motor strength or sensation in all extremities.  Psychiatric: Alert and oriented x3. Normal judgment and insight. Mood euthymic with congruent affect.   Labs on Admission: I have personally reviewed following labs and imaging studies  CBC:  Recent Labs Lab 05/18/17 1131  WBC 14.3*  NEUTROABS 10.3*  HGB 11.3*  HCT 31.3*  MCV 97.2  PLT 182   Basic Metabolic Panel:  Recent Labs Lab 05/18/17 1131  NA 135  K 3.4*  CL 99*  CO2 27  GLUCOSE 137*  BUN 16  CREATININE 0.94  CALCIUM 8.8*   Liver Function Tests:  Recent Labs Lab 05/18/17 1131  AST 39  ALT 23  ALKPHOS 54  BILITOT 1.1  PROT 6.1*  ALBUMIN 3.1*   Radiological Exams on Admission: Dg Shoulder Right  Result Date: 05/18/2017 CLINICAL DATA:  Status post all yesterday. Persistent anterior shoulder pain. EXAM: RIGHT SHOULDER - 2+ VIEW COMPARISON:  Chest x-ray dated March 21, 2013 which included a portion of the right shoulder. FINDINGS: The bones are subjectively adequately mineralized. There is moderate narrowing of the glenohumeral and AC joint spaces. There is mild narrowing of the subacromial subdeltoid space. No acute fracture is observed. The observed portions of the right  clavicle and upper right ribs are normal. IMPRESSION: No acute fracture nor dislocation of the right shoulder is observed. There is moderate osteoarthritic change of the glenohumeral and AC joints. Electronically Signed   By: David  Martinique M.D.   On: 05/18/2017 10:26   Dg Knee Complete 4 Views Right  Result Date: 05/18/2017 CLINICAL DATA:  83 year old male post fall yesterday. Pain. Initial encounter. EXAM: RIGHT KNEE - COMPLETE 4+ VIEW COMPARISON:  None. FINDINGS: Avulsion fracture of the superior patella suspected with displaced fracture fragments into suprapatellar region. Thickened distal quadriceps tendon suspicious for a tear. Joint effusion. Vascular calcifications. IMPRESSION: Avulsion fracture of the superior patella suspected with displaced fracture fragments into suprapatellar region. Thickened distal quadriceps tendon suspicious for a tear. Joint effusion. Electronically Signed   By: Genia Del M.D.   On: 05/18/2017 10:29   Dg Foot 2 Views Right  Result Date: 05/18/2017 CLINICAL DATA:  Status post fall yesterday. Bruised and swollen right great toe. EXAM: RIGHT FOOT - 2 VIEW COMPARISON:  None in PACs FINDINGS: The bones are subjectively mildly  osteopenic. The patient has sustained an acute fracture through the medial aspect of the base of the proximal phalanx of the great toe. There is pre-existing moderate first MTP joint space narrowing with irregularity of the articular surface of the head of the first metatarsal. The other phalanges appear intact as do the metatarsals. The bones of the hindfoot exhibit no acute abnormality. There is a plantar calcaneal spur. There are arterial calcifications about the ankle. IMPRESSION: There is an acute intra-articular fracture through the medial aspect of the base of the proximal phalanx of the right great toe. This is superimposed upon moderate osteoarthritic change of the first MTP joint. Electronically Signed   By: David  Martinique M.D.   On: 05/18/2017  10:24   EKG: Ordered  Assessment/Plan Active Problems:   Traumatic rupture of right quadriceps tendon   Traumatic avulsion fracture of right patella with quadriceps tendon tear: - Ad[facility] to [facility] for [facility] management 7/31 per orthopedics, Dr. Fredonia Highland.  - Pain control: tylenol and ibuprofen prn mild-moderate pain, oxycodone prn severe pain.  - NPO p MN - Check ECG for surgical risk assessment, though functional status is good and no history of adverse anesthetic reactions.   Traumatic fracture of right great toe: Intra-articular fracture through the medial aspect of the base of the proximal phalanx of the right great toe on XR. - Post-op shoe applied - Management per orthopedics  Essential HTN: Chronic, stable.  - Continue home medications.  Osteopenia:  - Continue vitamin D, takes alendronate qSunday   DVT prophylaxis: Heparin  Code Status: Full  Family Communication: Daughter at bedside Disposition Plan: Admit to [facility], PT evaluation post-op.  Consults called: Orthopedics by EDP  Admission status: Inpatient    Vance Gather, MD [facility] Pager [phone]  If 7PM-7AM, please contact night-coverage [email or web address] Password [email or web address] 05/18/2017, 1:22 PM

## 2017-05-18 NOTE — ED Notes (Signed)
Bed: WA21 Expected date:  Expected time:  Means of arrival:  Comments: EMS-fall/knee pain

## 2017-05-18 NOTE — Care Management Note (Signed)
Case Management Note  Patient Details  Name: Daniel Andersen MRN: [id number] Date of Birth: 1932/07/22  Subjective/Objective:                    Action/Plan: Pt plan of care changed to admission.  CM team will follow on inpt unit for D/C needs.  Expected Discharge Date: (Unknown)            Expected Discharge Plan:  [place]  Discharge planning Services  CM Consult  Choice offered to:  Patient  DME Arranged:  Patient refused services  HH Arranged:  Patient Refused HH  Status of Service:  In process, will continue to follow  Corky Crafts, RN 05/18/2017, 2:43 PM

## 2017-05-18 NOTE — ED Provider Notes (Signed)
[facility] DEPT Provider Note   CSN: [id number] Arrival date & time: 05/18/17  0093     History   Chief Complaint Chief Complaint  Patient presents with  . Fall  . Knee Pain    HPI Daniel Andersen is a 83 y.o. male.  HPI   83 year old male presents with concern for fall. Patient reports he tripped over his pit bull last night and landed directly onto his right knee after hitting his right shoulder on the refrigerator. Denies head trauma, loss of consciousness, headache, nausea, vomiting, numbness, weakness, neck pain, back pain. Reports pain is most significant in the right knee. It's worse with movement. Reports that he has to use his hands to lift the bottom of his leg up. Reports she's been taking Aleve, however is continued have pain and significant swelling of the right knee so came for evaluation. Reports the pain is mild to moderate. Reports also mild pain in the right shoulder as well as the right foot that he noticed today.  Past Medical History:  Diagnosis Date  . Hypertension   . Low bone mass 03/2013   Bone Density scan  . Obesity   . Shingles 2005  . Wears glasses   . Wears partial dentures     Patient Active Problem List   Diagnosis Date Noted  . Traumatic rupture of right quadriceps tendon 05/18/2017  . Closed fracture of right patella 05/18/2017  . Closed displaced fracture of phalanx of great toe, initial encounter 05/18/2017  . Medicare annual wellness visit, subsequent 05/06/2016  . Vaccine counseling 05/06/2016  . Essential hypertension 04/02/2015  . Impaired fasting glucose 04/02/2015  . History of colonic polyps 04/02/2015  . Osteopenia 04/02/2015  . Obesity 04/02/2015    Past Surgical History:  Procedure Laterality Date  . Colonoscopy  2010   Benign polyps, repeat in 5 years  . right inguinal herniorrhaphy  11/2004  . TONSILLECTOMY         Home Medications    Prior to Admission medications   Medication Sig Start Date End Date  Taking? Authorizing Provider  alendronate (FOSAMAX) 70 MG tablet TAKE 1 TABLET BY MOUTH ONCE A WEEK WITH A FULL GLASS OF WATER ON AN EMPTY STOMACH 04/28/17  Yes Tysinger, Camelia Eng, PA-C  amLODipine (NORVASC) 5 MG tablet TAKE 1 TABLET BY MOUTH EVERY DAY 06/09/16  Yes Tysinger, Camelia Eng, PA-C  aspirin 325 MG tablet 1/2 tablet daily 04/02/15  Yes Tysinger, Camelia Eng, PA-C  bisoprolol-hydrochlorothiazide (ZIAC) 5-6.25 MG tablet TAKE 1 TABLET BY MOUTH EVERY DAY 06/09/16  Yes Tysinger, Camelia Eng, PA-C  Cholecalciferol (VITAMIN D) 1000 UNITS capsule Take 1,000 Units by mouth daily.    Yes [provider]  fish oil-omega-3 fatty acids 1000 MG capsule Take 1 g by mouth daily.    Yes [provider]  Multiple Vitamins-Minerals (CENTRUM SILVER ULTRA MENS) TABS Take 1 tablet by mouth daily.     Yes [provider]  oxyCODONE (ROXICODONE) 5 MG immediate release tablet Take 1 tablet (5 mg total) by mouth every 4 (four) hours as needed for severe pain. 05/18/17   Gareth Morgan, MD    Family History History reviewed. No pertinent family history.  Social History Social History  Substance Use Topics  . Smoking status: Former Research scientist (life sciences)  . Smokeless tobacco: Never Used  . Alcohol use 8.4 oz/week    14 Cans of beer per week     Allergies   Patient has no known allergies.  Review of Systems Review of Systems  Constitutional: Negative for fever.  HENT: Negative for sore throat.   Eyes: Negative for visual disturbance.  Respiratory: Negative for shortness of breath.   Cardiovascular: Negative for chest pain.  Gastrointestinal: Negative for abdominal pain, nausea and vomiting.  Genitourinary: Negative for difficulty urinating.  Musculoskeletal: Positive for arthralgias. Negative for back pain and neck stiffness.  Skin: Negative for rash.  Neurological: Negative for syncope and headaches.     Physical Exam Updated Vital Signs BP (!) 115/49 (BP Location: Right Arm)   Pulse 72    Temp (!) 96.4 F (35.8 C) (Oral)   Resp (!) 22   Ht 5' 7.5" (1.715 m)   Wt 89.8 kg (198 lb)   SpO2 96%   BMI 30.55 kg/m   Physical Exam  Constitutional: He is oriented to person, place, and time. He appears well-developed and well-nourished. No distress.  HENT:  Head: Normocephalic and atraumatic.  Eyes: Conjunctivae and EOM are normal.  Neck: Normal range of motion.  Cardiovascular: Normal rate, regular rhythm, normal heart sounds and intact distal pulses.  Exam reveals no gallop and no friction rub.   No murmur heard. Pulmonary/Chest: Effort normal and breath sounds normal. No respiratory distress. He has no wheezes. He has no rales.  Abdominal: Soft. He exhibits no distension. There is no tenderness. There is no guarding.  Musculoskeletal: He exhibits no edema.  Pain with movement of right shoulder, mild tenderness right shoulder  Significant knee effusion right knee, patellar tenderness, unable to extend knee Tenderness First MTP, contusion first toe 2+pulses bilaterally  Neurological: He is alert and oriented to person, place, and time.  Skin: Skin is warm and dry. He is not diaphoretic.  Nursing note and vitals reviewed.    ED Treatments / Results  Labs (all labs ordered are listed, but only abnormal results are displayed) Labs Reviewed  CBC WITH DIFFERENTIAL/PLATELET - Abnormal; Notable for the following:       Result Value   WBC 14.3 (*)    RBC 3.22 (*)    Hemoglobin 11.3 (*)    HCT 31.3 (*)    MCH 35.1 (*)    MCHC 36.1 (*)    Neutro Abs 10.3 (*)    Monocytes Absolute 1.5 (*)    All other components within normal limits  COMPREHENSIVE METABOLIC PANEL - Abnormal; Notable for the following:    Potassium 3.4 (*)    Chloride 99 (*)    Glucose, Bld 137 (*)    Calcium 8.8 (*)    Total Protein 6.1 (*)    Albumin 3.1 (*)    All other components within normal limits  CBC - Abnormal; Notable for the following:    WBC 11.6 (*)    RBC 3.13 (*)    Hemoglobin 11.1  (*)    HCT 31.4 (*)    MCV 100.3 (*)    MCH 35.5 (*)    All other components within normal limits  CREATININE, SERUM - Abnormal; Notable for the following:    Creatinine, Ser 1.29 (*)    GFR calc non Af Amer 50 (*)    GFR calc Af Amer 57 (*)    All other components within normal limits  BASIC METABOLIC PANEL  CBC  PROTIME-INR    EKG  EKG Interpretation None       Radiology Dg Shoulder Right  Result Date: 05/18/2017 CLINICAL DATA:  Status post all yesterday. Persistent anterior shoulder pain. EXAM: RIGHT SHOULDER -  2+ VIEW COMPARISON:  Chest x-ray dated March 21, 2013 which included a portion of the right shoulder. FINDINGS: The bones are subjectively adequately mineralized. There is moderate narrowing of the glenohumeral and AC joint spaces. There is mild narrowing of the subacromial subdeltoid space. No acute fracture is observed. The observed portions of the right clavicle and upper right ribs are normal. IMPRESSION: No acute fracture nor dislocation of the right shoulder is observed. There is moderate osteoarthritic change of the glenohumeral and AC joints. Electronically Signed   By: David  Martinique M.D.   On: 05/18/2017 10:26   Dg Knee Complete 4 Views Right  Result Date: 05/18/2017 CLINICAL DATA:  83 year old male post fall yesterday. Pain. Initial encounter. EXAM: RIGHT KNEE - COMPLETE 4+ VIEW COMPARISON:  None. FINDINGS: Avulsion fracture of the superior patella suspected with displaced fracture fragments into suprapatellar region. Thickened distal quadriceps tendon suspicious for a tear. Joint effusion. Vascular calcifications. IMPRESSION: Avulsion fracture of the superior patella suspected with displaced fracture fragments into suprapatellar region. Thickened distal quadriceps tendon suspicious for a tear. Joint effusion. Electronically Signed   By: Genia Del M.D.   On: 05/18/2017 10:29   Dg Foot 2 Views Right  Result Date: 05/18/2017 CLINICAL DATA:  Status post fall  yesterday. Bruised and swollen right great toe. EXAM: RIGHT FOOT - 2 VIEW COMPARISON:  None in PACs FINDINGS: The bones are subjectively mildly osteopenic. The patient has sustained an acute fracture through the medial aspect of the base of the proximal phalanx of the great toe. There is pre-existing moderate first MTP joint space narrowing with irregularity of the articular surface of the head of the first metatarsal. The other phalanges appear intact as do the metatarsals. The bones of the hindfoot exhibit no acute abnormality. There is a plantar calcaneal spur. There are arterial calcifications about the ankle. IMPRESSION: There is an acute intra-articular fracture through the medial aspect of the base of the proximal phalanx of the right great toe. This is superimposed upon moderate osteoarthritic change of the first MTP joint. Electronically Signed   By: David  Martinique M.D.   On: 05/18/2017 10:24    Procedures Procedures (including critical care time)  Medications Ordered in ED Medications  amLODipine (NORVASC) tablet 5 mg (not administered)  bisoprolol-hydrochlorothiazide (ZIAC) 5-6.25 MG per tablet 1 tablet (not administered)  heparin injection 5,000 Units (5,000 Units Subcutaneous Given 05/18/17 1620)  sodium chloride flush (NS) 0.9 % injection 3 mL (3 mLs Intravenous Not Given 05/18/17 1459)  acetaminophen (TYLENOL) tablet 650 mg (not administered)    Or  acetaminophen (TYLENOL) suppository 650 mg (not administered)  oxyCODONE (Oxy IR/ROXICODONE) immediate release tablet 5 mg (not administered)  senna-docusate (Senokot-S) tablet 1 tablet (not administered)  ondansetron (ZOFRAN) tablet 4 mg (not administered)    Or  ondansetron (ZOFRAN) injection 4 mg (not administered)  ibuprofen (ADVIL,MOTRIN) tablet 600 mg (not administered)  cholecalciferol (VITAMIN D) tablet 1,000 Units (1,000 Units Oral Given 05/18/17 1620)  acetaminophen (TYLENOL) tablet 1,000 mg (1,000 mg Oral Given 05/18/17 0946)    ibuprofen (ADVIL,MOTRIN) tablet 400 mg (400 mg Oral Given 05/18/17 1304)     Initial Impression / Assessment and Plan / ED Course  I have reviewed the triage vital signs and the nursing notes.  Pertinent labs & imaging results that were available during my care of the patient were reviewed by me and considered in my medical decision making (see chart for details).      83 year old male presents with concern for  right knee pain after a fall yesterday. X-rays of the shoulder show no sign of acute abnormality. XR of foot shows proximal phalanx fx of great toe. X-ray of the right knee shows patellar avulsion fracture. Patient unable to extend at the knee. Placed in knee immobilizer. Called Dr. Percell Miller.  Patient lives alone, has stairs in home, and given age discussed with Dr. Percell Miller, who will take patient to the OR tomorrow for repair.  Hospitalist to admit to [facility]   Final Clinical Impressions(s) / ED Diagnoses   Final diagnoses:  Rupture of right quadriceps muscle, initial encounter  Other closed fracture of right patella, initial encounter  Closed displaced fracture of proximal phalanx of right great toe, initial encounter    New Prescriptions Current Discharge Medication List    START taking these medications   Details  oxyCODONE (ROXICODONE) 5 MG immediate release tablet Take 1 tablet (5 mg total) by mouth every 4 (four) hours as needed for severe pain. Qty: 12 tablet, Refills: 0         Gareth Morgan, MD 05/18/17 1910

## 2017-05-18 NOTE — Care Management Note (Signed)
Case Management Note  Patient Details  Name: Daniel Andersen MRN: [id number] Date of Birth: [birth year]  Subjective/Objective:  CM consulted for HHS/DME.  Pt noted to have medicare A&B.  Per Dr Billy Fischer, pt lives alone and has stairs at home.  Pt is trying to decide if he wants to stay and have surgery tomorrow or go home with HHS and DME. Pt has a rolling walker already.                   Action/Plan: Spoke with pt who states he wants to be D/C home.  Pt refused a wheelchair or 3-in-1 chair.  Pt states he plans to follow up with Dr Percell Miller for outpt surgery.  Will delay making HHS arrangements until after surgery due to the change in needs at that time and can be arranged by Dr Debroah Loop office at that time..  Dr. Billy Fischer updated.  No further CM needs noted at this time.  Expected Discharge Date:   05/18/17               Expected Discharge Plan:  Home/Self Care  Discharge planning Services  CM Consult  Post Acute Care Choice:   Home/self Choice offered to:  Patient  DME Arranged:  Patient refused services DME Agency:     HH Arranged:  Patient Refused [place] Agency:     Status of Service:  Completed, signed off  Corky Crafts, RN 05/18/2017, 12:50 PM

## 2017-05-18 NOTE — ED Triage Notes (Addendum)
Per EMS, pt c/o right knee pressure and swelling, worse with weightbearing, some right shoulder pain, onset at 1600 yesterday after tripping and falling onto right knee. Self-treated with ibuprofen at 0700 today with minimal relief. Bilateral lower extremity edema at pt self-reported baseline. No head injury or LOC, no anticoagulants.  Right knee edematous, unable to palpate patella through edema. Right great toe ecchymotic and tender to palpation. Right shoulder painful with ROM, no tenderness to palpation. 2+ pedal and radial pulse. Motor function and sensation intact.

## 2017-05-19 ENCOUNTER — Encounter (HOSPITAL_COMMUNITY): Payer: Self-pay | Admitting: Critical Care Medicine

## 2017-05-19 ENCOUNTER — Encounter (HOSPITAL_COMMUNITY)
Admission: EM | Disposition: A | Discharge status: Skilled Nursing Facility | Payer: Self-pay | Source: Home / Self Care | Attending: Family Medicine

## 2017-05-19 ENCOUNTER — Inpatient Hospital Stay (HOSPITAL_COMMUNITY): Payer: Medicare Other | Admitting: Anesthesiology

## 2017-05-19 DIAGNOSIS — Z01818 Encounter for other preprocedural examination: Secondary | ICD-10-CM

## 2017-05-19 HISTORY — PX: QUADRICEPS TENDON REPAIR: SHX756

## 2017-05-19 LAB — BASIC METABOLIC PANEL
ANION GAP: 7 (ref 5–15)
BUN: 17 mg/dL (ref 6–20)
CO2: 27 mmol/L (ref 22–32)
CREATININE: 1.07 mg/dL (ref 0.61–1.24)
Calcium: 8.3 mg/dL — ABNORMAL LOW (ref 8.9–10.3)
Chloride: 99 mmol/L — ABNORMAL LOW (ref 101–111)
GFR calc non Af Amer: >60 mL/min (ref >60)
Glucose, Bld: 119 mg/dL — ABNORMAL HIGH (ref 65–99)
Potassium: 2.8 mmol/L — ABNORMAL LOW (ref 3.5–5.1)
Sodium: 133 mmol/L — ABNORMAL LOW (ref 135–145)

## 2017-05-19 LAB — CBC
HEMATOCRIT: 30.4 % — AB (ref 39.0–52.0)
Hemoglobin: 10.5 g/dL — ABNORMAL LOW (ref 13.0–17.0)
MCH: 34.4 pg — AB (ref 26.0–34.0)
MCHC: 34.5 g/dL (ref 30.0–36.0)
MCV: 99.7 fL (ref 78.0–100.0)
PLATELETS: 274 10*3/uL (ref 150–400)
RBC: 3.05 MIL/uL — ABNORMAL LOW (ref 4.22–5.81)
RDW: 11.9 % (ref 11.5–15.5)
WBC: 12.3 10*3/uL — ABNORMAL HIGH (ref 4.0–10.5)

## 2017-05-19 LAB — SURGICAL PCR SCREEN
MRSA, PCR: NEGATIVE
Staphylococcus aureus: POSITIVE — AB

## 2017-05-19 LAB — PROTIME-INR
INR: 1.25
Prothrombin Time: 15.7 seconds — ABNORMAL HIGH (ref 11.4–15.2)

## 2017-05-19 LAB — MAGNESIUM: Magnesium: 2.1 mg/dL (ref 1.7–2.4)

## 2017-05-19 SURGERY — REPAIR, TENDON, QUADRICEPS
Anesthesia: Spinal | Site: Leg Upper | Laterality: Right

## 2017-05-19 MED ORDER — MORPHINE SULFATE (PF) 4 MG/ML IV SOLN: 0.0500 mg/kg | INTRAVENOUS | Status: DC | PRN

## 2017-05-19 MED ORDER — PHENYLEPHRINE 40 MCG/ML (10ML) SYRINGE FOR IV PUSH (FOR BLOOD PRESSURE SUPPORT)
PREFILLED_SYRINGE | INTRAVENOUS | Status: DC | PRN
  Administered 2017-05-19: 40 ug via INTRAVENOUS
  Administered 2017-05-19: 80 ug via INTRAVENOUS
  Administered 2017-05-19 (×2): 40 ug via INTRAVENOUS
  Administered 2017-05-19: 80 ug via INTRAVENOUS
  Administered 2017-05-19: 40 ug via INTRAVENOUS
  Administered 2017-05-19: 80 ug via INTRAVENOUS

## 2017-05-19 MED ORDER — OMEPRAZOLE 20 MG PO CPDR: 20.0000 mg | DELAYED_RELEASE_CAPSULE | Freq: Every day | ORAL | 0 refills | Status: DC

## 2017-05-19 MED ORDER — PROPOFOL 500 MG/50ML IV EMUL
INTRAVENOUS | Status: DC | PRN
  Administered 2017-05-19: 50 ug/kg/min via INTRAVENOUS

## 2017-05-19 MED ORDER — ONDANSETRON HCL 4 MG PO TABS: 4.0000 mg | ORAL_TABLET | Freq: Three times a day (TID) | ORAL | 0 refills | Status: DC | PRN

## 2017-05-19 MED ORDER — BACLOFEN 10 MG PO TABS
10.0000 mg | ORAL_TABLET | Freq: Three times a day (TID) | ORAL | Status: DC | PRN
  Administered 2017-05-19: 10 mg via ORAL
  Filled 2017-05-19: qty 1

## 2017-05-19 MED ORDER — ENOXAPARIN SODIUM 40 MG/0.4ML ~~LOC~~ SOLN
40.0000 mg | SUBCUTANEOUS | Status: DC
  Administered 2017-05-20 – 2017-05-21 (×2): 40 mg via SUBCUTANEOUS
  Filled 2017-05-19 (×2): qty 0.4

## 2017-05-19 MED ORDER — ROCURONIUM BROMIDE 10 MG/ML (PF) SYRINGE
PREFILLED_SYRINGE | INTRAVENOUS | Status: AC
  Filled 2017-05-19: qty 5

## 2017-05-19 MED ORDER — GLYCOPYRROLATE 0.2 MG/ML IV SOSY
PREFILLED_SYRINGE | INTRAVENOUS | Status: DC | PRN
  Administered 2017-05-19: .2 mg via INTRAVENOUS

## 2017-05-19 MED ORDER — ASPIRIN EC 325 MG PO TBEC: 325.0000 mg | DELAYED_RELEASE_TABLET | Freq: Every day | ORAL | 0 refills | Status: DC

## 2017-05-19 MED ORDER — BUPIVACAINE HCL (PF) 0.25 % IJ SOLN
INTRAMUSCULAR | Status: AC
  Filled 2017-05-19: qty 30

## 2017-05-19 MED ORDER — DOCUSATE SODIUM 100 MG PO CAPS
100.0000 mg | ORAL_CAPSULE | Freq: Two times a day (BID) | ORAL | Status: DC
  Administered 2017-05-19 – 2017-05-21 (×4): 100 mg via ORAL
  Filled 2017-05-19 (×4): qty 1

## 2017-05-19 MED ORDER — CHLORHEXIDINE GLUCONATE CLOTH 2 % EX PADS
6.0000 | MEDICATED_PAD | Freq: Every day | CUTANEOUS | Status: DC
  Administered 2017-05-19 – 2017-05-20 (×2): 6 via TOPICAL

## 2017-05-19 MED ORDER — ACETAMINOPHEN 500 MG PO TABS
1000.0000 mg | ORAL_TABLET | Freq: Once | ORAL | Status: AC
  Administered 2017-05-19: 1000 mg via ORAL

## 2017-05-19 MED ORDER — LACTATED RINGERS IV SOLN: INTRAVENOUS | Status: DC

## 2017-05-19 MED ORDER — ONDANSETRON HCL 4 MG/2ML IJ SOLN: 4.0000 mg | Freq: Four times a day (QID) | INTRAMUSCULAR | Status: DC | PRN

## 2017-05-19 MED ORDER — ONDANSETRON HCL 4 MG PO TABS: 4.0000 mg | ORAL_TABLET | Freq: Four times a day (QID) | ORAL | Status: DC | PRN

## 2017-05-19 MED ORDER — CEFAZOLIN SODIUM-DEXTROSE 2-4 GM/100ML-% IV SOLN
2.0000 g | INTRAVENOUS | Status: AC
  Administered 2017-05-19: 2 g via INTRAVENOUS

## 2017-05-19 MED ORDER — PHENYLEPHRINE HCL 10 MG/ML IJ SOLN
INTRAVENOUS | Status: DC | PRN
  Administered 2017-05-19: 25 ug/min via INTRAVENOUS

## 2017-05-19 MED ORDER — KETOROLAC TROMETHAMINE 30 MG/ML IJ SOLN
INTRAMUSCULAR | Status: AC
  Filled 2017-05-19: qty 1

## 2017-05-19 MED ORDER — OXYCODONE-ACETAMINOPHEN 5-325 MG PO TABS: 1.0000 | ORAL_TABLET | ORAL | 0 refills | Status: DC | PRN

## 2017-05-19 MED ORDER — PROPOFOL 10 MG/ML IV BOLUS
INTRAVENOUS | Status: DC | PRN
  Administered 2017-05-19 (×3): 10 mg via INTRAVENOUS

## 2017-05-19 MED ORDER — POTASSIUM CHLORIDE 10 MEQ/100ML IV SOLN
10.0000 meq | INTRAVENOUS | Status: AC
  Administered 2017-05-19 (×3): 10 meq via INTRAVENOUS
  Filled 2017-05-19 (×2): qty 100

## 2017-05-19 MED ORDER — FENTANYL CITRATE (PF) 100 MCG/2ML IJ SOLN
INTRAMUSCULAR | Status: DC | PRN
  Administered 2017-05-19: 50 ug via INTRAVENOUS

## 2017-05-19 MED ORDER — ACETAMINOPHEN 650 MG RE SUPP: 650.0000 mg | Freq: Four times a day (QID) | RECTAL | Status: DC | PRN

## 2017-05-19 MED ORDER — ACETAMINOPHEN 325 MG PO TABS: 650.0000 mg | ORAL_TABLET | Freq: Four times a day (QID) | ORAL | Status: DC | PRN

## 2017-05-19 MED ORDER — HYDROMORPHONE HCL 1 MG/ML IJ SOLN: 0.5000 mg | INTRAMUSCULAR | Status: DC | PRN

## 2017-05-19 MED ORDER — 0.9 % SODIUM CHLORIDE (POUR BTL) OPTIME
TOPICAL | Status: DC | PRN
  Administered 2017-05-19: 1000 mL

## 2017-05-19 MED ORDER — PROPOFOL 10 MG/ML IV BOLUS
INTRAVENOUS | Status: AC
  Filled 2017-05-19: qty 20

## 2017-05-19 MED ORDER — ACETAMINOPHEN 500 MG PO TABS
1000.0000 mg | ORAL_TABLET | Freq: Three times a day (TID) | ORAL | Status: DC
  Administered 2017-05-19 – 2017-05-21 (×4): 1000 mg via ORAL
  Filled 2017-05-19 (×5): qty 2

## 2017-05-19 MED ORDER — BISOPROLOL FUMARATE 5 MG PO TABS
5.0000 mg | ORAL_TABLET | Freq: Every day | ORAL | Status: DC
  Administered 2017-05-20 – 2017-05-21 (×2): 5 mg via ORAL
  Filled 2017-05-19 (×2): qty 1

## 2017-05-19 MED ORDER — OXYCODONE HCL 5 MG PO TABS
5.0000 mg | ORAL_TABLET | ORAL | Status: DC | PRN
  Administered 2017-05-19 – 2017-05-21 (×3): 10 mg via ORAL
  Filled 2017-05-19 (×5): qty 2

## 2017-05-19 MED ORDER — LIDOCAINE 2% (20 MG/ML) 5 ML SYRINGE
INTRAMUSCULAR | Status: AC
  Filled 2017-05-19: qty 5

## 2017-05-19 MED ORDER — DEXAMETHASONE SODIUM PHOSPHATE 10 MG/ML IJ SOLN
INTRAMUSCULAR | Status: AC
  Filled 2017-05-19: qty 1

## 2017-05-19 MED ORDER — ZOLPIDEM TARTRATE 5 MG PO TABS
5.0000 mg | ORAL_TABLET | Freq: Once | ORAL | Status: AC
  Administered 2017-05-19: 5 mg via ORAL
  Filled 2017-05-19: qty 1

## 2017-05-19 MED ORDER — METOCLOPRAMIDE HCL 5 MG PO TABS: 5.0000 mg | ORAL_TABLET | Freq: Three times a day (TID) | ORAL | Status: DC | PRN

## 2017-05-19 MED ORDER — DOCUSATE SODIUM 100 MG PO CAPS: 100.0000 mg | ORAL_CAPSULE | Freq: Two times a day (BID) | ORAL | 0 refills | Status: DC

## 2017-05-19 MED ORDER — LACTATED RINGERS IV SOLN
INTRAVENOUS | Status: DC
  Administered 2017-05-19 (×2): via INTRAVENOUS

## 2017-05-19 MED ORDER — SENNA 8.6 MG PO TABS
1.0000 | ORAL_TABLET | Freq: Two times a day (BID) | ORAL | Status: DC
  Administered 2017-05-19 – 2017-05-21 (×4): 8.6 mg via ORAL
  Filled 2017-05-19 (×4): qty 1

## 2017-05-19 MED ORDER — EPHEDRINE SULFATE-NACL 50-0.9 MG/10ML-% IV SOSY
PREFILLED_SYRINGE | INTRAVENOUS | Status: DC | PRN
  Administered 2017-05-19: 2.5 mg via INTRAVENOUS
  Administered 2017-05-19 (×2): 5 mg via INTRAVENOUS
  Administered 2017-05-19: 2.5 mg via INTRAVENOUS

## 2017-05-19 MED ORDER — CEFAZOLIN SODIUM-DEXTROSE 2-4 GM/100ML-% IV SOLN
INTRAVENOUS | Status: AC
  Filled 2017-05-19: qty 100

## 2017-05-19 MED ORDER — ALBUMIN HUMAN 5 % IV SOLN
INTRAVENOUS | Status: DC | PRN
  Administered 2017-05-19: 16:00:00 via INTRAVENOUS

## 2017-05-19 MED ORDER — LACTATED RINGERS IV SOLN
INTRAVENOUS | Status: DC
  Administered 2017-05-19 – 2017-05-20 (×3): via INTRAVENOUS

## 2017-05-19 MED ORDER — SUCCINYLCHOLINE CHLORIDE 200 MG/10ML IV SOSY
PREFILLED_SYRINGE | INTRAVENOUS | Status: AC
  Filled 2017-05-19: qty 10

## 2017-05-19 MED ORDER — MUPIROCIN 2 % EX OINT
1.0000 | TOPICAL_OINTMENT | Freq: Two times a day (BID) | CUTANEOUS | Status: DC
Start: 2017-05-19 — End: 2017-05-21
  Administered 2017-05-19 – 2017-05-21 (×6): 1 via NASAL
  Filled 2017-05-19 (×2): qty 22

## 2017-05-19 MED ORDER — BACLOFEN 10 MG PO TABS: 10.0000 mg | ORAL_TABLET | Freq: Three times a day (TID) | ORAL | 0 refills | Status: DC | PRN

## 2017-05-19 MED ORDER — METOCLOPRAMIDE HCL 5 MG/ML IJ SOLN: 5.0000 mg | Freq: Three times a day (TID) | INTRAMUSCULAR | Status: DC | PRN

## 2017-05-19 MED ORDER — ONDANSETRON HCL 4 MG/2ML IJ SOLN
INTRAMUSCULAR | Status: AC
  Filled 2017-05-19: qty 2

## 2017-05-19 MED ORDER — EPHEDRINE 5 MG/ML INJ
INTRAVENOUS | Status: AC
  Filled 2017-05-19: qty 20

## 2017-05-19 MED ORDER — PHENYLEPHRINE 40 MCG/ML (10ML) SYRINGE FOR IV PUSH (FOR BLOOD PRESSURE SUPPORT)
PREFILLED_SYRINGE | INTRAVENOUS | Status: AC
  Filled 2017-05-19: qty 20

## 2017-05-19 MED ORDER — ACETAMINOPHEN 500 MG PO TABS
ORAL_TABLET | ORAL | Status: AC
  Administered 2017-05-19: 1000 mg via ORAL
  Filled 2017-05-19: qty 2

## 2017-05-19 MED ORDER — CHLORHEXIDINE GLUCONATE 4 % EX LIQD: 60.0000 mL | Freq: Once | CUTANEOUS | Status: DC

## 2017-05-19 MED ORDER — POTASSIUM CHLORIDE 10 MEQ/100ML IV SOLN
INTRAVENOUS | Status: AC
  Filled 2017-05-19: qty 100

## 2017-05-19 MED ORDER — POTASSIUM CHLORIDE CRYS ER 20 MEQ PO TBCR
40.0000 meq | EXTENDED_RELEASE_TABLET | Freq: Once | ORAL | Status: AC
  Administered 2017-05-19: 40 meq via ORAL
  Filled 2017-05-19: qty 2

## 2017-05-19 MED ORDER — CEFAZOLIN SODIUM-DEXTROSE 1-4 GM/50ML-% IV SOLN
1.0000 g | Freq: Four times a day (QID) | INTRAVENOUS | Status: AC
  Administered 2017-05-19 – 2017-05-20 (×3): 1 g via INTRAVENOUS
  Filled 2017-05-19 (×3): qty 50

## 2017-05-19 MED ORDER — FENTANYL CITRATE (PF) 250 MCG/5ML IJ SOLN
INTRAMUSCULAR | Status: AC
  Filled 2017-05-19: qty 5

## 2017-05-19 SURGICAL SUPPLY — 60 items
BANDAGE ACE 6X5 VEL STRL LF (GAUZE/BANDAGES/DRESSINGS) ×3 IMPLANT
BANDAGE ESMARK 6X9 LF (GAUZE/BANDAGES/DRESSINGS) ×1 IMPLANT
BNDG CMPR 9X6 STRL LF SNTH (GAUZE/BANDAGES/DRESSINGS) ×2
BNDG CMPR MED 10X6 ELC LF (GAUZE/BANDAGES/DRESSINGS) ×1
BNDG COHESIVE 6X5 TAN STRL LF (GAUZE/BANDAGES/DRESSINGS) ×3 IMPLANT
BNDG ELASTIC 6X10 VLCR STRL LF (GAUZE/BANDAGES/DRESSINGS) ×3 IMPLANT
BNDG ESMARK 6X9 LF (GAUZE/BANDAGES/DRESSINGS) ×6
BNDG GAUZE ELAST 4 BULKY (GAUZE/BANDAGES/DRESSINGS) ×3 IMPLANT
CLOSURE STERI-STRIP 1/2X4 (GAUZE/BANDAGES/DRESSINGS) ×1
CLSR STERI-STRIP ANTIMIC 1/2X4 (GAUZE/BANDAGES/DRESSINGS) ×1 IMPLANT
COVER SURGICAL LIGHT HANDLE (MISCELLANEOUS) ×3 IMPLANT
CUFF TOURNIQUET SINGLE 34IN LL (TOURNIQUET CUFF) ×3 IMPLANT
CUFF TOURNIQUET SINGLE 44IN (TOURNIQUET CUFF) IMPLANT
DRAPE U-SHAPE 47X51 STRL (DRAPES) ×3 IMPLANT
DRSG PAD ABDOMINAL 8X10 ST (GAUZE/BANDAGES/DRESSINGS) ×3 IMPLANT
ELECT REM PT RETURN 9FT ADLT (ELECTROSURGICAL) ×3
ELECTRODE REM PT RTRN 9FT ADLT (ELECTROSURGICAL) ×1 IMPLANT
GAUZE SPONGE 4X4 12PLY STRL (GAUZE/BANDAGES/DRESSINGS) ×3 IMPLANT
GAUZE XEROFORM 1X8 LF (GAUZE/BANDAGES/DRESSINGS) ×3 IMPLANT
GLOVE BIOGEL PI IND STRL 8 (GLOVE) ×1 IMPLANT
GLOVE BIOGEL PI IND STRL 8.5 (GLOVE) ×1 IMPLANT
GLOVE BIOGEL PI INDICATOR 8 (GLOVE) ×2
GLOVE BIOGEL PI INDICATOR 8.5 (GLOVE) ×2
GLOVE ORTHO TXT STRL SZ7.5 (GLOVE) ×3 IMPLANT
GLOVE SURG ORTHO 8.0 STRL STRW (GLOVE) ×3 IMPLANT
GOWN STRL REUS W/ TWL LRG LVL3 (GOWN DISPOSABLE) ×2 IMPLANT
GOWN STRL REUS W/ TWL XL LVL3 (GOWN DISPOSABLE) ×2 IMPLANT
GOWN STRL REUS W/TWL LRG LVL3 (GOWN DISPOSABLE) ×6
GOWN STRL REUS W/TWL XL LVL3 (GOWN DISPOSABLE) ×6
KIT BASIN OR (CUSTOM PROCEDURE TRAY) ×3 IMPLANT
KIT ROOM TURNOVER OR (KITS) ×3 IMPLANT
MANIFOLD NEPTUNE II (INSTRUMENTS) ×3 IMPLANT
NEEDLE HYPO 25GX1X1/2 BEV (NEEDLE) IMPLANT
NS IRRIG 1000ML POUR BTL (IV SOLUTION) ×3 IMPLANT
PACK ORTHO EXTREMITY (CUSTOM PROCEDURE TRAY) ×3 IMPLANT
PAD ABD 8X10 STRL (GAUZE/BANDAGES/DRESSINGS) ×2 IMPLANT
PAD ARMBOARD 7.5X6 YLW CONV (MISCELLANEOUS) ×6 IMPLANT
PADDING CAST COTTON 6X4 STRL (CAST SUPPLIES) ×10 IMPLANT
RETRIEVER SUT HEWSON (MISCELLANEOUS) ×3 IMPLANT
STAPLER VISISTAT 35W (STAPLE) ×3 IMPLANT
STOCKINETTE IMPERVIOUS LG (DRAPES) ×3 IMPLANT
SUT ETHILON 3 0 PS 1 (SUTURE) ×6 IMPLANT
SUT FIBERWIRE #2 38 T-5 BLUE (SUTURE) ×9
SUT MNCRL AB 4-0 PS2 18 (SUTURE) ×2 IMPLANT
SUT MON AB 2-0 CT1 36 (SUTURE) ×2 IMPLANT
SUT VIC AB 0 CT1 27 (SUTURE) ×18
SUT VIC AB 0 CT1 27XBRD ANBCTR (SUTURE) ×2 IMPLANT
SUT VIC AB 1 CT1 27 (SUTURE) ×6
SUT VIC AB 1 CT1 27XBRD ANBCTR (SUTURE) ×2 IMPLANT
SUT VIC AB 2-0 CT1 27 (SUTURE) ×6
SUT VIC AB 2-0 CT1 27XBRD (SUTURE) ×2 IMPLANT
SUTURE FIBERWR #2 38 T-5 BLUE (SUTURE) ×1 IMPLANT
SYR CONTROL 10ML LL (SYRINGE) IMPLANT
TOWEL OR 17X24 6PK STRL BLUE (TOWEL DISPOSABLE) ×3 IMPLANT
TOWEL OR 17X26 10 PK STRL BLUE (TOWEL DISPOSABLE) ×3 IMPLANT
TUBE CONNECTING 12'X1/4 (SUCTIONS) ×1
TUBE CONNECTING 12X1/4 (SUCTIONS) ×2 IMPLANT
UNDERPAD 30X30 (UNDERPADS AND DIAPERS) ×3 IMPLANT
WATER STERILE IRR 1000ML POUR (IV SOLUTION) ×3 IMPLANT
YANKAUER SUCT BULB TIP NO VENT (SUCTIONS) ×3 IMPLANT

## 2017-05-19 NOTE — Interval H&P Note (Signed)
History and Physical Interval Note:  05/19/2017 7:58 AM  Daniel Andersen  has presented today for surgery, with the diagnosis of Quadricep tendon rupture  The various methods of treatment have been discussed with the patient and family. After consideration of risks, benefits and other options for treatment, the patient has consented to  Procedure(s): REPAIR QUADRICEP TENDON RIGHT (Right) as a surgical intervention .  The patient's history has been reviewed, patient examined, no change in status, stable for surgery.  I have reviewed the patient's chart and labs.  Questions were answered to the patient's satisfaction.     Riddik Senna D

## 2017-05-19 NOTE — Anesthesia Preprocedure Evaluation (Addendum)
Anesthesia Evaluation  Patient identified by MRN, date of birth, ID band Patient awake    Reviewed: Allergy & Precautions  Airway Mallampati: II  TM Distance: <3 FB Neck ROM: Limited    Dental  (+) Missing, Poor Dentition   Pulmonary former smoker,    breath sounds clear to auscultation       Cardiovascular hypertension,  Rhythm:Regular Rate:Normal     Neuro/Psych    GI/Hepatic negative GI ROS, Neg liver ROS,   Endo/Other  negative endocrine ROS  Renal/GU negative Renal ROS     Musculoskeletal  (+) Arthritis ,   Abdominal (+) + obese,   Peds  Hematology   Anesthesia Other Findings   Reproductive/Obstetrics                            Anesthesia Physical Anesthesia Plan  ASA: III  Anesthesia Plan: Spinal   Post-op Pain Management:    Induction: Intravenous  PONV Risk Score and Plan: 1 and Ondansetron and Dexamethasone  Airway Management Planned: Simple Face Mask and Natural Airway  Additional Equipment:   Intra-op Plan:   Post-operative Plan:   Informed Consent: I have reviewed the patients History and Physical, chart, labs and discussed the procedure including the risks, benefits and alternatives for the proposed anesthesia with the patient or authorized representative who has indicated his/her understanding and acceptance.   Dental advisory given  Plan Discussed with: CRNA  Anesthesia Plan Comments:         Anesthesia Quick Evaluation

## 2017-05-19 NOTE — Anesthesia Procedure Notes (Signed)
Spinal  Patient location during procedure: OR Start time: 05/19/2017 2:15 PM End time: 05/19/2017 2:28 PM Staffing Anesthesiologist: Rica Koyanagi Performed: anesthesiologist  Preanesthetic Checklist Completed: patient identified, site marked, surgical consent, pre-op evaluation, timeout performed, IV checked, risks and benefits discussed and monitors and equipment checked Spinal Block Patient position: sitting Prep: ChloraPrep Patient monitoring: heart rate, cardiac monitor, continuous pulse ox and blood pressure Approach: right paramedian Location: L3-4 Injection technique: single-shot Needle Needle type: Quincke  Needle gauge: 22 G Needle length: 9 cm Needle insertion depth: 6 cm Assessment Sensory level: T8

## 2017-05-19 NOTE — Op Note (Signed)
05/18/2017 - 05/19/2017  3:33 PM  PATIENT:  Daniel Andersen    PRE-OPERATIVE DIAGNOSIS:  Quadricep tendon rupture  POST-OPERATIVE DIAGNOSIS:  Same  PROCEDURE:  REPAIR QUADRICEP TENDON RIGHT  SURGEON:  Ege Muckey, Ernesta Amble, MD  ASSISTANT: Roxan Hockey, PA-C, he was present and scrubbed throughout the case, critical for completion in a timely fashion, and for retraction, instrumentation, and closure.   ANESTHESIA:   gen  PREOPERATIVE INDICATIONS:  [name] is a  83 y.o. male with a diagnosis of Quadricep tendon rupture who elected for surgical management.    The risks benefits and alternatives were discussed with the patient preoperatively including but not limited to the risks of infection, bleeding, nerve injury, cardiopulmonary complications, the need for revision surgery, among others, and the patient was willing to proceed.  OPERATIVE FINDINGS: complete rupture  BLOOD LOSS: min  TOURNIQUET TIME: 44min  OPERATIVE PROCEDURE:  Patient was identified in the preoperative holding area and site was marked by me He was transported to the operating theater and placed on the table in supine position taking care to pad all bony prominences. After a preincinduction time out anesthesia was induced. The right lower extremity was prepped and draped in normal sterile fashion and a pre-incision timeout was performed. He received ancef for preoperative antibiotics.   I made an incision directly over his traumatic injury. I dissected down to the level of the peritenon and elevated skin flaps over top of this there were full-thickness.  I then incised the peritenon it was partially ruptured as well. Identified his rupture tendon.  I debrided tendon from the patella allowing a good bony bed for healing.  I then used 2 #2 FiberWire as a whipstitched up and back in the Tendon leaving me with 4 total strands coming out.  I thoroughly irrigated the joint  Next I used a drill bit to drill 3  holes in the patella taking care to not penetrate the articular surface. I used a [place] suture passer to pass all 4 stitches through these holes.  The patella reapproximated well to the tendon. I tied the stitches over top of the bone bridge of the patella.  I then stressed the repair and it was stable to 25 degrees.  I repaird the paratenon and capsul on the lateral and medial wounds  I then thoroughly irrigated the wound again.  I closed the ruptured peritenon as well as the surgically incised peritenon with an 0 Vicryl. Then closed the skin with a monocryl stitch  Sterile dressing was applied the knee was placed in a knee immobilizer and taken the PACU in stable condition.  POST OPERATIVE PLAN: WBAT in immobilizer, DVT px: early ambulation and chemical px

## 2017-05-19 NOTE — Transfer of Care (Signed)
Immediate Anesthesia Transfer of Care Note  Patient: Daniel Andersen  Procedure(s) Performed: Procedure(s): REPAIR QUADRICEP TENDON RIGHT (Right)  Patient Location: PACU  Anesthesia Type:MAC and Spinal  Level of Consciousness: awake and alert   Airway & Oxygen Therapy: Patient Spontanous Breathing  Post-op Assessment: Report given to RN and Post -op Vital signs reviewed and stable  Post vital signs: Reviewed and stable  Last Vitals:  Vitals:   05/19/17 1130 05/19/17 1610  BP: (!) 145/65 (!) 115/47  Pulse: 65 73  Resp: 17 14  Temp: 36.4 C (!) 36.4 C   HR 74, RR 20, BP 115/42, sats 95% Last Pain:  Vitals:   05/19/17 1130  TempSrc: Oral  PainSc:       Patients Stated Pain Goal: 2 (04/88/89 1694)  Complications: No apparent anesthesia complications

## 2017-05-19 NOTE — Progress Notes (Signed)
Neo turned off at 1620.

## 2017-05-19 NOTE — Progress Notes (Signed)
Patient arrived to PACU on neo running at 3mcg per CRNA Heather. Attempted to wean off neo but pressures still low coming from the OR. Dr Orene Desanctis ordered 250 albumin, hanging on arrival to PACU. Will attempt to wean off neo and complete run of albumin. Pressures currently 115/47 and 134/59. Pt asymptomatic.

## 2017-05-19 NOTE — H&P (View-Only) (Signed)
ORTHOPAEDIC CONSULTATION  REQUESTING PHYSICIAN: Bonnielee Haff, MD  Chief Complaint: Right knee pain  Assessment / Plan: Active Problems:   Essential hypertension   Osteopenia   Traumatic rupture of right quadriceps tendon   Closed fracture of right patella   Closed displaced fracture of phalanx of great toe, initial encounter  Acute traumatic right quad tendon rupture Plan for operative fixation this afternoon WBAT in Knee immobilizer in full extension all times Nothing by mouth The patient is hoping to return home-he lives in a multistory home alone but has a daughter locally who can check on him. Previously ambulatory without aid. Plan for therapy evals postop to assess mobility.  The risks benefits and alternatives of the procedure were discussed with the patient.  The patient verbalizes understanding and wishes to proceed.    Closed fracture base of the proximal phalanx right great toe: Nonoperative management Continue postop shoe  Right shoulder pain: Improved significantly since event. Able to forward flex overhead. Will follow as an outpatient  HPI: Daniel Andersen is a 83 y.o. male who complains of right knee pain after tripping over a dog into her refrigerator. He had immediate pain and inability to extend at the knee. He presented to the emergency department where x-rays showed an avulsion fracture of the superior patella suspected with displaced fracture fragments into the suprapatellar region.. Thickened distal quadriceps tendon. Orthopedics was consulted for evaluation  Past Medical History:  Diagnosis Date  . Hypertension   . Low bone mass 03/2013   Bone Density scan  . Obesity   . Shingles 2005  . Wears glasses   . Wears partial dentures    Past Surgical History:  Procedure Laterality Date  . COLONOSCOPY W/ BIOPSIES AND POLYPECTOMY  2010   Benign polyps, repeat in 5 years  . INGUINAL HERNIA REPAIR Right 11/2004  . TONSILLECTOMY     Social  History   Social History  . Marital status: Widowed    Spouse name: N/A  . Number of children: N/A  . Years of education: N/A   Occupational History  . retired, works part time - [employer]    Social History Main Topics  . Smoking status: Former Smoker    Packs/day: 0.50    Years: 20.00    Types: Cigarettes  . Smokeless tobacco: Never Used  . Alcohol use 7.2 oz/week    12 Cans of beer per week  . Drug use: No  . Sexual activity: Not Asked     Comment: widowed; exercises daily with weights and aerobic   Other Topics Concern  . None   Social History Narrative   Widowed, daughter in [place], sees her few times per week, exercising - gym every morning - cardio with treadmill, machine weights.   Works at [employer].    Travels up to his hometown in [place] occasionally   History reviewed. No pertinent family history. No Known Allergies Prior to Admission medications   Medication Sig Start Date End Date Taking? Authorizing Provider  alendronate (FOSAMAX) 70 MG tablet TAKE 1 TABLET BY MOUTH ONCE A WEEK WITH A FULL GLASS OF WATER ON AN EMPTY STOMACH 04/28/17  Yes Tysinger, Camelia Eng, PA-C  amLODipine (NORVASC) 5 MG tablet TAKE 1 TABLET BY MOUTH EVERY DAY 06/09/16  Yes Tysinger, Camelia Eng, PA-C  aspirin 325 MG tablet 1/2 tablet daily 04/02/15  Yes Tysinger, Camelia Eng, PA-C  bisoprolol-hydrochlorothiazide (ZIAC) 5-6.25 MG tablet TAKE 1 TABLET BY  MOUTH EVERY DAY 06/09/16  Yes Tysinger, Camelia Eng, PA-C  Cholecalciferol (VITAMIN D) 1000 UNITS capsule Take 1,000 Units by mouth daily.    Yes [provider]  fish oil-omega-3 fatty acids 1000 MG capsule Take 1 g by mouth daily.    Yes [provider]  Multiple Vitamins-Minerals (CENTRUM SILVER ULTRA MENS) TABS Take 1 tablet by mouth daily.     Yes [provider]  oxyCODONE (ROXICODONE) 5 MG immediate release tablet Take 1 tablet (5 mg total) by mouth every 4 (four) hours as needed for  severe pain. 05/18/17   Gareth Morgan, MD   Dg Shoulder Right  Result Date: 05/18/2017 CLINICAL DATA:  Status post all yesterday. Persistent anterior shoulder pain. EXAM: RIGHT SHOULDER - 2+ VIEW COMPARISON:  Chest x-ray dated March 21, 2013 which included a portion of the right shoulder. FINDINGS: The bones are subjectively adequately mineralized. There is moderate narrowing of the glenohumeral and AC joint spaces. There is mild narrowing of the subacromial subdeltoid space. No acute fracture is observed. The observed portions of the right clavicle and upper right ribs are normal. IMPRESSION: No acute fracture nor dislocation of the right shoulder is observed. There is moderate osteoarthritic change of the glenohumeral and AC joints. Electronically Signed   By: David  Martinique M.D.   On: 05/18/2017 10:26   Dg Knee Complete 4 Views Right  Result Date: 05/18/2017 CLINICAL DATA:  83 year old male post fall yesterday. Pain. Initial encounter. EXAM: RIGHT KNEE - COMPLETE 4+ VIEW COMPARISON:  None. FINDINGS: Avulsion fracture of the superior patella suspected with displaced fracture fragments into suprapatellar region. Thickened distal quadriceps tendon suspicious for a tear. Joint effusion. Vascular calcifications. IMPRESSION: Avulsion fracture of the superior patella suspected with displaced fracture fragments into suprapatellar region. Thickened distal quadriceps tendon suspicious for a tear. Joint effusion. Electronically Signed   By: Genia Del M.D.   On: 05/18/2017 10:29   Dg Foot 2 Views Right  Result Date: 05/18/2017 CLINICAL DATA:  Status post fall yesterday. Bruised and swollen right great toe. EXAM: RIGHT FOOT - 2 VIEW COMPARISON:  None in PACs FINDINGS: The bones are subjectively mildly osteopenic. The patient has sustained an acute fracture through the medial aspect of the base of the proximal phalanx of the great toe. There is pre-existing moderate first MTP joint space narrowing with  irregularity of the articular surface of the head of the first metatarsal. The other phalanges appear intact as do the metatarsals. The bones of the hindfoot exhibit no acute abnormality. There is a plantar calcaneal spur. There are arterial calcifications about the ankle. IMPRESSION: There is an acute intra-articular fracture through the medial aspect of the base of the proximal phalanx of the right great toe. This is superimposed upon moderate osteoarthritic change of the first MTP joint. Electronically Signed   By: David  Martinique M.D.   On: 05/18/2017 10:24    Positive ROS: All other systems have been reviewed and were otherwise negative with the exception of those mentioned in the HPI and as above.  Objective: Labs cbc  Recent Labs  05/18/17 1600 05/19/17 0404  WBC 11.6* 12.3*  HGB 11.1* 10.5*  HCT 31.4* 30.4*  PLT 269 274    Labs inflam No results for input(s): CRP in the last 72 hours.  Invalid input(s): ESR  Labs coag  Recent Labs  05/19/17 0404  INR 1.25     Recent Labs  05/18/17 1131 05/18/17 1600 05/19/17 0404  NA 135  --  133*  K 3.4*  --  2.8*  CL 99*  --  99*  CO2 27  --  27  GLUCOSE 137*  --  119*  BUN 16  --  17  CREATININE 0.94 1.29* 1.07  CALCIUM 8.8*  --  8.3*    Physical Exam: Vitals:   05/18/17 2013 05/19/17 0551  BP: (!) 137/54 (!) 157/63  Pulse: 73 83  Resp: 20 20  Temp: 98.1 F (36.7 C) 97.7 F (36.5 C)   General: Alert, no acute distress. Pleasant, conversant Mental status: Alert and Oriented x3 Neurologic: Speech Clear and organized, no gross focal findings or movement disorder appreciated. Respiratory: No cyanosis, no use of accessory musculature Cardiovascular: No pedal edema GI: Abdomen is soft and non-tender, non-distended. Skin: Warm and dry.  No lesions in the area of chief complaint . Extremities: Warm dry Psychiatric: Patient is competent for consent with normal mood and affect  MUSCULOSKELETAL:  Right knee pain with  movement of the leg. Palpable effusion. Moderate tenderness to palpation. Unable to extend knee. Neurovascular intact distally. Postop shoe in place right foot. Right shoulder forward flexion to above head without apparent discomfort. Neurovascularly intact Other extremities are atraumatic with painless ROM and NVI.   Prudencio Burly III PA-C 05/19/2017 6:32 AM

## 2017-05-19 NOTE — Progress Notes (Signed)
PT Cancellation Note  Patient Details Name: Daniel Andersen MRN: [id number] DOB: October 30, 1931   Cancelled Treatment:     patient is scheduled for  Surgery today. Will await post op orders for PT.   Marcelino Freestone PT [phone]  05/19/2017, 7:52 AM

## 2017-05-19 NOTE — Progress Notes (Signed)
[facility] PROGRESS NOTE  Daniel Andersen GOT:[id number] DOB: 27-Jan-1932 DOA: 05/18/2017  PCP: Denita Lung, MD  Brief History/Interval Summary: 83 year old Caucasian male with a past medical history of hypertension and osteopenia who presented after a mechanical fall resulting in pain in his right knee, shoulder and foot. He was found to have a patellar avulsion fracture in the right knee. Seen by orthopedics and needs surgical intervention. He was hospitalized for further management.  Reason for Visit: Right patellar avulsion fracture with quadriceps tendon tear  Consultants: Orthopedics  Procedures: Surgery today  Antibiotics: None  Subjective/Interval History: Patient feels well this morning. Denies any significant pain in his right lower extremity. No chest pain, no shortness of breath. Denies any history of heart disease. Has good baseline functional status and exercises on the treadmill.  ROS: Denies any nausea or vomiting.  Objective:  Vital Signs  Vitals:   05/18/17 1500 05/18/17 2013 05/19/17 0551 05/19/17 0748  BP:  (!) 137/54 (!) 157/63 (!) 157/63  Pulse:  73 83 78  Resp:  20 20 18   Temp:  98.1 F (36.7 C) 97.7 F (36.5 C) 98.3 F (36.8 C)  TempSrc:  Oral Oral Oral  SpO2:  97% 96% 96%  Weight: 89.8 kg (198 lb)     Height: 5' 7.5" (1.715 m)       Intake/Output Summary (Last 24 hours) at 05/19/17 1048 Last data filed at 05/19/17 0500  Gross per 24 hour  Intake              360 ml  Output                0 ml  Net              360 ml   Filed Weights   05/18/17 1500  Weight: 89.8 kg (198 lb)    General appearance: alert, cooperative, appears stated age and no distress Resp: clear to auscultation bilaterally Cardio: regular rate and rhythm, S1, S2 normal, no murmur, click, rub or gallop GI: soft, non-tender; bowel sounds normal; no masses,  no organomegaly Extremities: Right lower extremity noted to be swollen around the knee joint due to  his injury. This extremity was not mobilized. Neurologic: Awake and alert. Oriented 3. No focal neurological deficits.  Lab Results:  Data Reviewed: I have personally reviewed following labs and imaging studies  CBC:  Recent Labs Lab 05/18/17 1131 05/18/17 1600 05/19/17 0404  WBC 14.3* 11.6* 12.3*  NEUTROABS 10.3*  --   --   HGB 11.3* 11.1* 10.5*  HCT 31.3* 31.4* 30.4*  MCV 97.2 100.3* 99.7  PLT 265 269 597    Basic Metabolic Panel:  Recent Labs Lab 05/18/17 1131 05/18/17 1600 05/19/17 0404  NA 135  --  133*  K 3.4*  --  2.8*  CL 99*  --  99*  CO2 27  --  27  GLUCOSE 137*  --  119*  BUN 16  --  17  CREATININE 0.94 1.29* 1.07  CALCIUM 8.8*  --  8.3*    GFR: Estimated Creatinine Clearance: 56.5 mL/min (by C-G formula based on SCr of 1.07 mg/dL).  Liver Function Tests:  Recent Labs Lab 05/18/17 1131  AST 39  ALT 23  ALKPHOS 54  BILITOT 1.1  PROT 6.1*  ALBUMIN 3.1*     Coagulation Profile:  Recent Labs Lab 05/19/17 0404  INR 1.25     Recent Results (from the past 240 hour(s))  Surgical PCR  screen     Status: Abnormal   Collection Time: 05/18/17 10:22 PM  Result Value Ref Range Status   MRSA, PCR NEGATIVE NEGATIVE Final   Staphylococcus aureus POSITIVE (A) NEGATIVE Final    Comment:        The Xpert SA Assay (FDA approved for NASAL specimens in patients over 62 years of age), is one component of a comprehensive surveillance program.  Test performance has been validated by [facility] for patients greater than or equal to 52 year old. It is not intended to diagnose infection nor to guide or monitor treatment.       Radiology Studies: Dg Shoulder Right  Result Date: 05/18/2017 CLINICAL DATA:  Status post all yesterday. Persistent anterior shoulder pain. EXAM: RIGHT SHOULDER - 2+ VIEW COMPARISON:  Chest x-ray dated March 21, 2013 which included a portion of the right shoulder. FINDINGS: The bones are subjectively adequately mineralized.  There is moderate narrowing of the glenohumeral and AC joint spaces. There is mild narrowing of the subacromial subdeltoid space. No acute fracture is observed. The observed portions of the right clavicle and upper right ribs are normal. IMPRESSION: No acute fracture nor dislocation of the right shoulder is observed. There is moderate osteoarthritic change of the glenohumeral and AC joints. Electronically Signed   By: David  Martinique M.D.   On: 05/18/2017 10:26   Dg Knee Complete 4 Views Right  Result Date: 05/18/2017 CLINICAL DATA:  83 year old male post fall yesterday. Pain. Initial encounter. EXAM: RIGHT KNEE - COMPLETE 4+ VIEW COMPARISON:  None. FINDINGS: Avulsion fracture of the superior patella suspected with displaced fracture fragments into suprapatellar region. Thickened distal quadriceps tendon suspicious for a tear. Joint effusion. Vascular calcifications. IMPRESSION: Avulsion fracture of the superior patella suspected with displaced fracture fragments into suprapatellar region. Thickened distal quadriceps tendon suspicious for a tear. Joint effusion. Electronically Signed   By: Genia Del M.D.   On: 05/18/2017 10:29   Dg Foot 2 Views Right  Result Date: 05/18/2017 CLINICAL DATA:  Status post fall yesterday. Bruised and swollen right great toe. EXAM: RIGHT FOOT - 2 VIEW COMPARISON:  None in PACs FINDINGS: The bones are subjectively mildly osteopenic. The patient has sustained an acute fracture through the medial aspect of the base of the proximal phalanx of the great toe. There is pre-existing moderate first MTP joint space narrowing with irregularity of the articular surface of the head of the first metatarsal. The other phalanges appear intact as do the metatarsals. The bones of the hindfoot exhibit no acute abnormality. There is a plantar calcaneal spur. There are arterial calcifications about the ankle. IMPRESSION: There is an acute intra-articular fracture through the medial aspect of the  base of the proximal phalanx of the right great toe. This is superimposed upon moderate osteoarthritic change of the first MTP joint. Electronically Signed   By: David  Martinique M.D.   On: 05/18/2017 10:24     Medications:  Scheduled: . amLODipine  5 mg Oral Daily  . bisoprolol  5 mg Oral Daily  . Chlorhexidine Gluconate Cloth  6 each Topical Daily  . cholecalciferol  1,000 Units Oral Daily  . heparin  5,000 Units Subcutaneous Q8H  . mupirocin ointment  1 application Nasal BID  . [COMPLETED] potassium chloride  40 mEq Oral Once  . sodium chloride flush  3 mL Intravenous Q12H   Continuous: . lactated ringers    . potassium chloride 10 mEq (05/19/17 0946)   HBZ:[id number] **OR** acetaminophen, ibuprofen, ondansetron **OR**  ondansetron (ZOFRAN) IV, oxyCODONE, senna-docusate  Assessment/Plan:  Active Problems:   Essential hypertension   Osteopenia   Traumatic rupture of right quadriceps tendon   Closed fracture of right patella   Closed displaced fracture of phalanx of great toe, initial encounter    Traumatic avulsion fracture of the right patella with quadriceps tendon tear. Patient is seen by orthopedics and needs surgical intervention. Surgery is planned for this afternoon. EKG shows sinus rhythm without any ischemic changes. PVC is noted. Patient without any shortness of breath or chest pain. He has had reasonably good functional capacity at baseline. Electrolytes to be replaced. Based on the [place] Perioperative Risk Index the patient's estimated risk probability for perioperative myocardial infarction or cardiac arrest is 0.22%. Patient's procedure is intermediate risk. Based on the AHA/ACC algorithms patient may proceed to surgery without further testing after his electrolytes have been repleted. Continue his bisoprolol. Pulmonary toilet. Incentive spirometry post operatively.  Hypokalemia Stop his diuretic. Replace potassium. Check magnesium level. Repeat labs later  today.  Traumatic fracture of the right great toe. Management per orthopedics.  History of essential Hypertension. Chronic condition. Stable. Continue this bisoprolol as well as his amlodipine. Hold his hydrochlorothiazide.  History of osteopenia. Continue vitamin D. He is also on bisphosphonate at home, which can be resumed at discharge.  Normocytic anemia and mild leukocytosis. No evidence for overt bleeding. Monitor hemoglobin closely. Elevated WBC could be reactive from acute injury.  DVT Prophylaxis: subcutaneous heparin    Code Status: Full code  Family Communication: Discussed with the patient. No family at bedside  Disposition Plan: Management as outlined above    LOS: 1 day   [facility] Pager [phone] 05/19/2017, 10:48 AM  If 7PM-7AM, please contact night-coverage at [email or web address], password [facility]

## 2017-05-19 NOTE — Progress Notes (Signed)
Report received from Noralee Stain., RN from 6N.

## 2017-05-19 NOTE — Care Management Note (Signed)
Case Management Note  Patient Details  Name: Daniel Andersen MRN: [id number] Date of Birth: 1931/11/19  Subjective/Objective:     Fall, right patellar avulsion fracture with quadriceps tendon tear, surgery 05/19/2017               Action/Plan: Discharge Planning: Please see previous NCM notes. Pt has RW at home. Pt lives alone but has support from dtr, [name] [phone]. Waiting PT/OT recommendations. Will continue to follow for dc needs.   PCP Denita Lung   Expected Discharge Date:                  Expected Discharge Plan:  [place]  In-House Referral:  Clinical Social Work  Discharge planning Services  CM Consult  Post Acute Care Choice:  Home Health Choice offered to:  Patient  DME Arranged:    DME Agency:     HH Arranged:    [place] Agency:     Status of Service:  In process, will continue to follow  If discussed at Long Length of Stay Meetings, dates discussed:    Additional Comments:  Erenest Rasher, RN 05/19/2017, 4:35 PM

## 2017-05-19 NOTE — Consult Note (Signed)
   ORTHOPAEDIC CONSULTATION  REQUESTING PHYSICIAN: Krishnan, Gokul, MD  Chief Complaint: Right knee pain  Assessment / Plan: Active Problems:   Essential hypertension   Osteopenia   Traumatic rupture of right quadriceps tendon   Closed fracture of right patella   Closed displaced fracture of phalanx of great toe, initial encounter  Acute traumatic right quad tendon rupture Plan for operative fixation this afternoon WBAT in Knee immobilizer in full extension all times Nothing by mouth The patient is hoping to return home-he lives in a multistory home alone but has a daughter locally who can check on him. Previously ambulatory without aid. Plan for therapy evals postop to assess mobility.  The risks benefits and alternatives of the procedure were discussed with the patient.  The patient verbalizes understanding and wishes to proceed.    Closed fracture base of the proximal phalanx right great toe: Nonoperative management Continue postop shoe  Right shoulder pain: Improved significantly since event. Able to forward flex overhead. Will follow as an outpatient  HPI: Daniel Andersen is a 83 y.o. male who complains of right knee pain after tripping over a dog into her refrigerator. He had immediate pain and inability to extend at the knee. He presented to the emergency department where x-rays showed an avulsion fracture of the superior patella suspected with displaced fracture fragments into the suprapatellar region.. Thickened distal quadriceps tendon. Orthopedics was consulted for evaluation  Past Medical History:  Diagnosis Date  . Hypertension   . Low bone mass 03/2013   Bone Density scan  . Obesity   . Shingles 2005  . Wears glasses   . Wears partial dentures    Past Surgical History:  Procedure Laterality Date  . COLONOSCOPY W/ BIOPSIES AND POLYPECTOMY  2010   Benign polyps, repeat in 5 years  . INGUINAL HERNIA REPAIR Right 11/2004  . TONSILLECTOMY     Social  History   Social History  . Marital status: Widowed    Spouse name: N/A  . Number of children: N/A  . Years of education: N/A   Occupational History  . retired, works part time - [employer]    Social History Main Topics  . Smoking status: Former Smoker    Packs/day: 0.50    Years: 20.00    Types: Cigarettes  . Smokeless tobacco: Never Used  . Alcohol use 7.2 oz/week    12 Cans of beer per week  . Drug use: No  . Sexual activity: Not Asked     Comment: widowed; exercises daily with weights and aerobic   Other Topics Concern  . None   Social History Narrative   Widowed, daughter in [place], sees her few times per week, exercising - gym every morning - cardio with treadmill, machine weights.   Works at [employer].    Travels up to his hometown in [place] occasionally   History reviewed. No pertinent family history. No Known Allergies Prior to Admission medications   Medication Sig Start Date End Date Taking? Authorizing Provider  alendronate (FOSAMAX) 70 MG tablet TAKE 1 TABLET BY MOUTH ONCE A WEEK WITH A FULL GLASS OF WATER ON AN EMPTY STOMACH 04/28/17  Yes Tysinger, David S, PA-C  amLODipine (NORVASC) 5 MG tablet TAKE 1 TABLET BY MOUTH EVERY DAY 06/09/16  Yes Tysinger, David S, PA-C  aspirin 325 MG tablet 1/2 tablet daily 04/02/15  Yes Tysinger, David S, PA-C  bisoprolol-hydrochlorothiazide (ZIAC) 5-6.25 MG tablet TAKE 1 TABLET BY   MOUTH EVERY DAY 06/09/16  Yes Tysinger, David S, PA-C  Cholecalciferol (VITAMIN D) 1000 UNITS capsule Take 1,000 Units by mouth daily.    Yes [provider]  fish oil-omega-3 fatty acids 1000 MG capsule Take 1 g by mouth daily.    Yes [provider]  Multiple Vitamins-Minerals (CENTRUM SILVER ULTRA MENS) TABS Take 1 tablet by mouth daily.     Yes [provider]  oxyCODONE (ROXICODONE) 5 MG immediate release tablet Take 1 tablet (5 mg total) by mouth every 4 (four) hours as needed for  severe pain. 05/18/17   Schlossman, Erin, MD   Dg Shoulder Right  Result Date: 05/18/2017 CLINICAL DATA:  Status post all yesterday. Persistent anterior shoulder pain. EXAM: RIGHT SHOULDER - 2+ VIEW COMPARISON:  Chest x-ray dated March 21, 2013 which included a portion of the right shoulder. FINDINGS: The bones are subjectively adequately mineralized. There is moderate narrowing of the glenohumeral and AC joint spaces. There is mild narrowing of the subacromial subdeltoid space. No acute fracture is observed. The observed portions of the right clavicle and upper right ribs are normal. IMPRESSION: No acute fracture nor dislocation of the right shoulder is observed. There is moderate osteoarthritic change of the glenohumeral and AC joints. Electronically Signed   By: David  Jordan M.D.   On: 05/18/2017 10:26   Dg Knee Complete 4 Views Right  Result Date: 05/18/2017 CLINICAL DATA:  85-year-old male post fall yesterday. Pain. Initial encounter. EXAM: RIGHT KNEE - COMPLETE 4+ VIEW COMPARISON:  None. FINDINGS: Avulsion fracture of the superior patella suspected with displaced fracture fragments into suprapatellar region. Thickened distal quadriceps tendon suspicious for a tear. Joint effusion. Vascular calcifications. IMPRESSION: Avulsion fracture of the superior patella suspected with displaced fracture fragments into suprapatellar region. Thickened distal quadriceps tendon suspicious for a tear. Joint effusion. Electronically Signed   By: Steven  Olson M.D.   On: 05/18/2017 10:29   Dg Foot 2 Views Right  Result Date: 05/18/2017 CLINICAL DATA:  Status post fall yesterday. Bruised and swollen right great toe. EXAM: RIGHT FOOT - 2 VIEW COMPARISON:  None in PACs FINDINGS: The bones are subjectively mildly osteopenic. The patient has sustained an acute fracture through the medial aspect of the base of the proximal phalanx of the great toe. There is pre-existing moderate first MTP joint space narrowing with  irregularity of the articular surface of the head of the first metatarsal. The other phalanges appear intact as do the metatarsals. The bones of the hindfoot exhibit no acute abnormality. There is a plantar calcaneal spur. There are arterial calcifications about the ankle. IMPRESSION: There is an acute intra-articular fracture through the medial aspect of the base of the proximal phalanx of the right great toe. This is superimposed upon moderate osteoarthritic change of the first MTP joint. Electronically Signed   By: David  Jordan M.D.   On: 05/18/2017 10:24    Positive ROS: All other systems have been reviewed and were otherwise negative with the exception of those mentioned in the HPI and as above.  Objective: Labs cbc  Recent Labs  05/18/17 1600 05/19/17 0404  WBC 11.6* 12.3*  HGB 11.1* 10.5*  HCT 31.4* 30.4*  PLT 269 274    Labs inflam No results for input(s): CRP in the last 72 hours.  Invalid input(s): ESR  Labs coag  Recent Labs  05/19/17 0404  INR 1.25     Recent Labs  05/18/17 1131 05/18/17 1600 05/19/17 0404  NA 135  --    133*  K 3.4*  --  2.8*  CL 99*  --  99*  CO2 27  --  27  GLUCOSE 137*  --  119*  BUN 16  --  17  CREATININE 0.94 1.29* 1.07  CALCIUM 8.8*  --  8.3*    Physical Exam: Vitals:   05/18/17 2013 05/19/17 0551  BP: (!) 137/54 (!) 157/63  Pulse: 73 83  Resp: 20 20  Temp: 98.1 F (36.7 C) 97.7 F (36.5 C)   General: Alert, no acute distress. Pleasant, conversant Mental status: Alert and Oriented x3 Neurologic: Speech Clear and organized, no gross focal findings or movement disorder appreciated. Respiratory: No cyanosis, no use of accessory musculature Cardiovascular: No pedal edema GI: Abdomen is soft and non-tender, non-distended. Skin: Warm and dry.  No lesions in the area of chief complaint . Extremities: Warm dry Psychiatric: Patient is competent for consent with normal mood and affect  MUSCULOSKELETAL:  Right knee pain with  movement of the leg. Palpable effusion. Moderate tenderness to palpation. Unable to extend knee. Neurovascular intact distally. Postop shoe in place right foot. Right shoulder forward flexion to above head without apparent discomfort. Neurovascularly intact Other extremities are atraumatic with painless ROM and NVI.   Mostafa Yuan Calvin Martensen III PA-C 05/19/2017 6:32 AM 

## 2017-05-19 NOTE — Consult Note (Signed)
           [facility] CM Primary Care Navigator  05/19/2017  Daniel Andersen [facility] [birth year] [id number]   Went to see patient at the bedside to identify possible discharge needs but staffreports that patient is in the OR for surgeryat this moment. (Repair of right quadriceps tendon).  Will tryto meet with patient at another time when available in room.    Addendum:  Went back to see patientat the bedside to identify possible discharge needs.  Patient reports having increased pain to right leg with difficulty walking after tripping over dog and falling on his knees that led to this admission/ surgery.  Patient endorses Dr. Jill Alexanders or Dorothea Ogle, PA with [facility] as his primary care provider.   Patient shared using [facility] on [address] to obtain medications without any problem.   Patient reports managinghis own medications at home straight out of the containers.   He verbalized being able to drive prior to admission/ surgery, however, his daughter [name]) will provide transportation to hisdoctors'appointments while he is recovering.   Patient lives alone and independent, but states that daughter (lives nearby) will be assisting with his care needs at home after discharge from rehab facility.  Anticipated discharge plan is skilled nursing facility for rehabilitation (still in process) before going back home as per therapy recommendation.  Patient voiced understanding to callprimary care provider's office when he returnsback home,for a post discharge follow-up visitwithin a week or sooner if needs arise.Patient letter (with PCP's contact number) was provided as hisreminder.  Patient communicated no further health managementneeds or concerns at this time.  [facility] care management information provided for future needs that he may have.     For questions, please contact:  Dannielle Huh, BSN, RN- [facility] Primary Care Navigator   Telephone: [phone] [place]

## 2017-05-20 ENCOUNTER — Encounter (HOSPITAL_COMMUNITY): Payer: Self-pay | Admitting: Orthopedic Surgery

## 2017-05-20 DIAGNOSIS — E876 Hypokalemia: Secondary | ICD-10-CM

## 2017-05-20 DIAGNOSIS — I10 Essential (primary) hypertension: Secondary | ICD-10-CM

## 2017-05-20 DIAGNOSIS — S76111D Strain of right quadriceps muscle, fascia and tendon, subsequent encounter: Secondary | ICD-10-CM

## 2017-05-20 LAB — BASIC METABOLIC PANEL
Anion gap: 9 (ref 5–15)
BUN: 9 mg/dL (ref 6–20)
CALCIUM: 8.3 mg/dL — AB (ref 8.9–10.3)
CO2: 26 mmol/L (ref 22–32)
Chloride: 97 mmol/L — ABNORMAL LOW (ref 101–111)
Creatinine, Ser: 0.89 mg/dL (ref 0.61–1.24)
GFR calc Af Amer: >60 mL/min (ref >60)
GFR calc non Af Amer: >60 mL/min (ref >60)
GLUCOSE: 142 mg/dL — AB (ref 65–99)
Potassium: 3.6 mmol/L (ref 3.5–5.1)
Sodium: 132 mmol/L — ABNORMAL LOW (ref 135–145)

## 2017-05-20 LAB — CBC
HEMATOCRIT: 27.3 % — AB (ref 39.0–52.0)
Hemoglobin: 9.6 g/dL — ABNORMAL LOW (ref 13.0–17.0)
MCH: 35 pg — AB (ref 26.0–34.0)
MCHC: 35.2 g/dL (ref 30.0–36.0)
MCV: 99.6 fL (ref 78.0–100.0)
PLATELETS: 285 10*3/uL (ref 150–400)
RBC: 2.74 MIL/uL — ABNORMAL LOW (ref 4.22–5.81)
RDW: 11.8 % (ref 11.5–15.5)
WBC: 18.2 10*3/uL — ABNORMAL HIGH (ref 4.0–10.5)

## 2017-05-20 LAB — MAGNESIUM: Magnesium: 1.7 mg/dL (ref 1.7–2.4)

## 2017-05-20 MED ORDER — ZOLPIDEM TARTRATE 5 MG PO TABS
5.0000 mg | ORAL_TABLET | Freq: Every evening | ORAL | Status: DC | PRN
  Administered 2017-05-20: 5 mg via ORAL
  Filled 2017-05-20: qty 1

## 2017-05-20 NOTE — Evaluation (Signed)
Physical Therapy Evaluation Patient Details Name: Daniel Andersen MRN: [id number] DOB: 11-Sep-1932 Today's Date: 05/20/2017   History of Present Illness  Pt is an 83 yo male admitted through ED at [facility] on 05/18/17 following a fall over a dog on 05/17/17 resulting in a right patellar avulsion fx, intraarticular fx of proximal right great toe phalanx. Pt was transferred to [facility] on 05/19/17 and underwent an right quad tendon repair. PMH significant for HTN, osteopenia.  Clinical Impression  Pt presents POD 1 following the above procedure. Prior to admission, pt lived alone in a two story home and was completely independent. Pt now requires Min A to min guard for all mobility for short distances before he has increased pain and becomes fatigued. Pt is not safe to return home independently at this time and will benefit from SNF at discharge in order to maximize his outcomes before returning home. Pt continues to benefit from acute follow-up to address the below deficits prior to discharge.     Follow Up Recommendations SNF;Supervision for mobility/OOB    Equipment Recommendations  Rolling walker with 5" wheels;3in1 (PT)    Recommendations for Other Services OT consult     Precautions / Restrictions Precautions Precautions: Fall Required Braces or Orthoses: Other Brace/Splint Other Brace/Splint: lateral and posterior splinting.  Restrictions Weight Bearing Restrictions: Yes RLE Weight Bearing: Weight bearing as tolerated Other Position/Activity Restrictions: in splint or knee immobilizer      Mobility  Bed Mobility               General bed mobility comments: pt up in recliner when PT arrives  Transfers Overall transfer level: Needs assistance Equipment used: Rolling walker (2 wheeled) Transfers: Sit to/from Stand Sit to Stand: Min guard         General transfer comment: Min gaurd for safety from lower recliner  Ambulation/Gait Ambulation/Gait assistance: Min assist;Min  guard Ambulation Distance (Feet): 50 Feet Assistive device: Rolling walker (2 wheeled) Gait Pattern/deviations: Step-through pattern;Decreased step length - left;Decreased stance time - right;Antalgic Gait velocity: decreased Gait velocity interpretation: Below normal speed for age/gender General Gait Details: Moderate antalgic gait with cues for sequencing and proximity to RW. Pt has increased c/o pain and dyspnea with gait.   Stairs            Wheelchair Mobility    Modified Rankin (Stroke Patients Only)       Balance Overall balance assessment: Needs assistance Sitting-balance support: No upper extremity supported;Feet supported Sitting balance-Leahy Scale: Good     Standing balance support: Bilateral upper extremity supported;During functional activity Standing balance-Leahy Scale: Poor Standing balance comment: reliant on RW for stability in standing                             Pertinent Vitals/Pain Pain Assessment: Faces Faces Pain Scale: Hurts little more Pain Location: right knee with mobility Pain Descriptors / Indicators: Aching;Grimacing;Guarding;Sore Pain Intervention(s): Monitored during session;Premedicated before session;Repositioned;Ice applied    Home Living Family/patient expects to be discharged to:: Private residence Living Arrangements: Alone Available Help at Discharge: Family;Available PRN/intermittently (daughter lives across town) Type of Home: House Home Access: Stairs to enter Entrance Stairs-Rails: Right;Left;Can reach both Technical brewer of Steps: 5 Home Layout: 1/2 bath on main level;Bed/bath upstairs Home Equipment: Walker - 2 wheels      Prior Function Level of Independence: Independent         Comments: completely independent, going to gym and driving.  Hand Dominance   Dominant Hand: Right    Extremity/Trunk Assessment   Upper Extremity Assessment Upper Extremity Assessment: Defer to OT  evaluation    Lower Extremity Assessment Lower Extremity Assessment: RLE deficits/detail RLE Deficits / Details: pt with post op pain and weakness and ROM deficits due to splinting and nature of injrury. At least 3/5 ankle, knee and hip per gross functional assessment.  RLE: Unable to fully assess due to immobilization    Cervical / Trunk Assessment Cervical / Trunk Assessment: Normal  Communication   Communication: No difficulties  Cognition Arousal/Alertness: Awake/alert Behavior During Therapy: WFL for tasks assessed/performed Overall Cognitive Status: Within Functional Limits for tasks assessed                                        General Comments General comments (skin integrity, edema, etc.): patient shows some signs of mild cognitive deficits. Asks x2 about getting up to the bathroom and reiterated that he needs to call NT to assist.     Exercises     Assessment/Plan    PT Assessment Patient needs continued PT services  PT Problem List Decreased strength;Decreased range of motion;Decreased activity tolerance;Decreased balance;Decreased mobility;Decreased knowledge of use of DME;Decreased safety awareness;Pain       PT Treatment Interventions DME instruction;Gait training;Stair training;Functional mobility training;Therapeutic activities;Therapeutic exercise;Balance training;Patient/family education    PT Goals (Current goals can be found in the Care Plan section)  Acute Rehab PT Goals Patient Stated Goal: agreeable to rehab then home PT Goal Formulation: With patient Time For Goal Achievement: 05/27/17 Potential to Achieve Goals: Good    Frequency Min 3X/week   Barriers to discharge Decreased caregiver support;Inaccessible home environment daugther works and is not availble to assist. Lives in two story home and will need to climb stairs to get to bedroom    Co-evaluation               AM-PAC PT "6 Clicks" Daily Activity  Outcome  Measure Difficulty turning over in bed (including adjusting bedclothes, sheets and blankets)?: None Difficulty moving from lying on back to sitting on the side of the bed? : Total Difficulty sitting down on and standing up from a chair with arms (e.g., wheelchair, bedside commode, etc,.)?: Total Help needed moving to and from a bed to chair (including a wheelchair)?: A Little Help needed walking in hospital room?: A Little Help needed climbing 3-5 steps with a railing? : A Lot 6 Click Score: 14    End of Session Equipment Utilized During Treatment: Gait belt;Other (comment) (splinting R knee) Activity Tolerance: Patient tolerated treatment well;Patient limited by lethargy Patient left: in chair;with call bell/phone within reach Nurse Communication: Mobility status PT Visit Diagnosis: Unsteadiness on feet (R26.81);Muscle weakness (generalized) (M62.81);Difficulty in walking, not elsewhere classified (R26.2);Pain Pain - Right/Left: Right Pain - part of body: Knee    Time: 4496-7591 PT Time Calculation (min) (ACUTE ONLY): 27 min   Charges:   PT Evaluation $PT Eval Moderate Complexity: 1 Mod PT Treatments $Gait Training: 8-22 mins   PT G Codes:        Scheryl Marten PT, DPT  [phone]   Jacqulyn Liner Sloan Leiter 05/20/2017, 4:21 PM

## 2017-05-20 NOTE — Progress Notes (Signed)
[facility] PROGRESS NOTE  Daniel WHETSEL ZJQ:[id number] DOB: [birth year] DOA: 05/18/2017  PCP: Denita Lung, MD  Brief History/Interval Summary: 83 year old Caucasian male with a past medical history of hypertension and osteopenia who presented after a mechanical fall resulting in pain in his right knee, shoulder and foot. He was found to have a patellar avulsion fracture in the right knee. Seen by orthopedics and needs surgical intervention. He was hospitalized for further management.  Reason for Visit: Right patellar avulsion fracture with quadriceps tendon tear  Consultants: Orthopedics  Procedures: s/p repair 05/19/17 Quad Tendon repair, right  Antibiotics: None  Subjective/Interval History: Patient feels well this morning. Denies any significant pain in his right lower extremity. No chest pain, no shortness of breath. Denies any history of heart disease. He is sitting up in a chair.   ROS: Denies any nausea or vomiting.  Objective:  Vital Signs  Vitals:   05/19/17 1838 05/19/17 2043 05/20/17 0118 05/20/17 0507  BP: (!) 151/56 (!) 166/58 (!) 149/57 (!) 161/69  Pulse: 72 64 76 86  Resp: 18 20 16 20   Temp: 97.9 F (36.6 C) (!) 97.5 F (36.4 C) 97.7 F (36.5 C) (!) 97.4 F (36.3 C)  TempSrc:  Oral Oral Oral  SpO2: 98% 96% 94% 97%  Weight:      Height:        Intake/Output Summary (Last 24 hours) at 05/20/17 0847 Last data filed at 05/20/17 2409  Gross per 24 hour  Intake             2670 ml  Output             1650 ml  Net             1020 ml   Filed Weights   05/18/17 1500  Weight: 89.8 kg (198 lb)   General appearance: sitting up in chair, alert, cooperative, appears stated age and no distress Resp: clear to auscultation bilaterally Cardio: regular rate and rhythm, S1, S2 normal, no murmur, click, rub or gallop GI: soft, non-tender; bowel sounds normal; no masses,  no organomegaly Extremities: Right lower extremity noted to be swollen around the  knee joint due to his injury. This extremity was not mobilized. Neurologic: Awake and alert. Oriented 3. No focal neurological deficits.  Lab Results:  Data Reviewed: I have personally reviewed following labs and imaging studies  CBC:  Recent Labs Lab 05/18/17 1131 05/18/17 1600 05/19/17 0404 05/20/17 0503  WBC 14.3* 11.6* 12.3* 18.2*  NEUTROABS 10.3*  --   --   --   HGB 11.3* 11.1* 10.5* 9.6*  HCT 31.3* 31.4* 30.4* 27.3*  MCV 97.2 100.3* 99.7 99.6  PLT 265 269 274 735    Basic Metabolic Panel:  Recent Labs Lab 05/18/17 1131 05/18/17 1600 05/19/17 0404 05/19/17 0727 05/20/17 0503  NA 135  --  133*  --  132*  K 3.4*  --  2.8*  --  3.6  CL 99*  --  99*  --  97*  CO2 27  --  27  --  26  GLUCOSE 137*  --  119*  --  142*  BUN 16  --  17  --  9  CREATININE 0.94 1.29* 1.07  --  0.89  CALCIUM 8.8*  --  8.3*  --  8.3*  MG  --   --   --  2.1 1.7    GFR: Estimated Creatinine Clearance: 67.9 mL/min (by C-G formula based on SCr  of 0.89 mg/dL).  Liver Function Tests:  Recent Labs Lab 05/18/17 1131  AST 39  ALT 23  ALKPHOS 54  BILITOT 1.1  PROT 6.1*  ALBUMIN 3.1*     Coagulation Profile:  Recent Labs Lab 05/19/17 0404  INR 1.25     Recent Results (from the past 240 hour(s))  Surgical PCR screen     Status: Abnormal   Collection Time: 05/18/17 10:22 PM  Result Value Ref Range Status   MRSA, PCR NEGATIVE NEGATIVE Final   Staphylococcus aureus POSITIVE (A) NEGATIVE Final    Comment:        The Xpert SA Assay (FDA approved for NASAL specimens in patients over 59 years of age), is one component of a comprehensive surveillance program.  Test performance has been validated by [facility] for patients greater than or equal to 80 year old. It is not intended to diagnose infection nor to guide or monitor treatment.     Radiology Studies: Dg Shoulder Right  Result Date: 05/18/2017 CLINICAL DATA:  Status post all yesterday. Persistent anterior  shoulder pain. EXAM: RIGHT SHOULDER - 2+ VIEW COMPARISON:  Chest x-ray dated March 21, 2013 which included a portion of the right shoulder. FINDINGS: The bones are subjectively adequately mineralized. There is moderate narrowing of the glenohumeral and AC joint spaces. There is mild narrowing of the subacromial subdeltoid space. No acute fracture is observed. The observed portions of the right clavicle and upper right ribs are normal. IMPRESSION: No acute fracture nor dislocation of the right shoulder is observed. There is moderate osteoarthritic change of the glenohumeral and AC joints. Electronically Signed   By: David  Martinique M.D.   On: 05/18/2017 10:26   Dg Knee Complete 4 Views Right  Result Date: 05/18/2017 CLINICAL DATA:  83 year old male post fall yesterday. Pain. Initial encounter. EXAM: RIGHT KNEE - COMPLETE 4+ VIEW COMPARISON:  None. FINDINGS: Avulsion fracture of the superior patella suspected with displaced fracture fragments into suprapatellar region. Thickened distal quadriceps tendon suspicious for a tear. Joint effusion. Vascular calcifications. IMPRESSION: Avulsion fracture of the superior patella suspected with displaced fracture fragments into suprapatellar region. Thickened distal quadriceps tendon suspicious for a tear. Joint effusion. Electronically Signed   By: Genia Del M.D.   On: 05/18/2017 10:29   Dg Foot 2 Views Right  Result Date: 05/18/2017 CLINICAL DATA:  Status post fall yesterday. Bruised and swollen right great toe. EXAM: RIGHT FOOT - 2 VIEW COMPARISON:  None in PACs FINDINGS: The bones are subjectively mildly osteopenic. The patient has sustained an acute fracture through the medial aspect of the base of the proximal phalanx of the great toe. There is pre-existing moderate first MTP joint space narrowing with irregularity of the articular surface of the head of the first metatarsal. The other phalanges appear intact as do the metatarsals. The bones of the hindfoot exhibit  no acute abnormality. There is a plantar calcaneal spur. There are arterial calcifications about the ankle. IMPRESSION: There is an acute intra-articular fracture through the medial aspect of the base of the proximal phalanx of the right great toe. This is superimposed upon moderate osteoarthritic change of the first MTP joint. Electronically Signed   By: David  Martinique M.D.   On: 05/18/2017 10:24   Medications:  Scheduled: . acetaminophen  1,000 mg Oral Q8H  . amLODipine  5 mg Oral Daily  . bisoprolol  5 mg Oral Daily  . Chlorhexidine Gluconate Cloth  6 each Topical Daily  . cholecalciferol  1,000  Units Oral Daily  . docusate sodium  100 mg Oral BID  . enoxaparin (LOVENOX) injection  40 mg Subcutaneous Q24H  . mupirocin ointment  1 application Nasal BID  . senna  1 tablet Oral BID  . sodium chloride flush  3 mL Intravenous Q12H   Continuous: .  ceFAZolin (ANCEF) IV 1 g (05/20/17 0830)  . lactated ringers 100 mL/hr at 05/20/17 9381   WEX:[id number] **OR** acetaminophen, baclofen, HYDROmorphone (DILAUDID) injection, ibuprofen, metoCLOPramide **OR** metoCLOPramide (REGLAN) injection, ondansetron **OR** ondansetron (ZOFRAN) IV, oxyCODONE  Assessment/Plan:  Active Problems:   Essential hypertension   Osteopenia   Traumatic rupture of right quadriceps tendon   Closed fracture of right patella   Closed displaced fracture of phalanx of great toe, initial encounter  Traumatic avulsion fracture of the right patella with quadriceps tendon tear. Patient is seen by orthopedics and needed surgical intervention.  POD#1 S/P Procedure(s) (LRB): REPAIR QUADRICEP TENDON RIGHT (Right) by Dr. Ernesta Amble. Murphy on 05/19/17 Postop per orthopedics.   Hypokalemia Stopped his diuretic. Replaced potassium.   Traumatic fracture of the right great toe. Management per orthopedics.  History of essential Hypertension. Chronic condition. Stable. Continue this bisoprolol as well as his amlodipine. Hold  his hydrochlorothiazide.  History of osteopenia. Continue vitamin D. He is also on bisphosphonate at home, which can be resumed at discharge.  Acute blood loss anemia - some dilutional effect and post op.   Normocytic anemia and mild leukocytosis. No evidence for overt bleeding. Monitor hemoglobin closely. Elevated WBC could be reactive from acute injury.  DVT Prophylaxis: subcutaneous heparin    Code Status: Full code  Family Communication: Discussed with the patient. No family at bedside  Disposition Plan: PT/OT eval pending   LOS: 2 days   [place] [facility] Pager [phone] 05/20/2017, 8:47 AM  If 7PM-7AM, please contact night-coverage at [email or web address], password [facility]

## 2017-05-20 NOTE — Progress Notes (Signed)
   Assessment / Plan: 1 Day Post-Op  S/P Procedure(s) (LRB): REPAIR QUADRICEP TENDON RIGHT (Right) by Dr. Ernesta Amble. Percell Miller on 05/19/17  Active Problems:   Essential hypertension   Osteopenia   Traumatic rupture of right quadriceps tendon   Closed fracture of right patella   Closed displaced fracture of phalanx of great toe, initial encounter Acute Blood Loss Anemia, Mild.  Currently asymptomatic.  Hgb 9.6<<11.1.  Likely w/ dilutional component.  Plts wnl.  Quad Tendon Rupture, Right. S/p Repair 05/19/17. Pain controlled.   Must keep leg in full extension.  He has lateral / posterior splint.  Okay to add ABD padding to top / bottom to reduce pressure prn . Up with therapy Incentive Spirometry Elevate and Apply ice  Weight Bearing: Weight Bearing as Tolerated (WBAT) in full extension full time Dressings: Reinforce PRN.  VTE prophylaxis: Lovenox, SCDs, ambulation Dispo: Pending therapy evals.  He lives alone, but has help from his daughter.  SNF likely.  Subjective: Patient reports pain as mild, controlled.  Tolerating diet.  Urinating.  No CP, SOB. OOB in room and to chair.  Objective:   VITALS:   Vitals:   05/19/17 1838 05/19/17 2043 05/20/17 0118 05/20/17 0507  BP: (!) 151/56 (!) 166/58 (!) 149/57 (!) 161/69  Pulse: 72 64 76 86  Resp: 18 20 16 20   Temp: 97.9 F (36.6 C) (!) 97.5 F (36.4 C) 97.7 F (36.5 C) (!) 97.4 F (36.3 C)  TempSrc:  Oral Oral Oral  SpO2: 98% 96% 94% 97%  Weight:      Height:       CBC Latest Ref Rng & Units 05/20/2017 05/19/2017 05/18/2017  WBC 4.0 - 10.5 K/uL 18.2(H) 12.3(H) 11.6(H)  Hemoglobin 13.0 - 17.0 g/dL 9.6(L) 10.5(L) 11.1(L)  Hematocrit 39.0 - 52.0 % 27.3(L) 30.4(L) 31.4(L)  Platelets 150 - 400 K/uL 285 274 269   BMP Latest Ref Rng & Units 05/20/2017 05/19/2017 05/18/2017  Glucose 65 - 99 mg/dL 142(H) 119(H) -  BUN 6 - 20 mg/dL 9 17 -  Creatinine 0.61 - 1.24 mg/dL 0.89 1.07 1.29(H)  Sodium 135 - 145 mmol/L 132(L) 133(L) -  Potassium  3.5 - 5.1 mmol/L 3.6 2.8(L) -  Chloride 101 - 111 mmol/L 97(L) 99(L) -  CO2 22 - 32 mmol/L 26 27 -  Calcium 8.9 - 10.3 mg/dL 8.3(L) 8.3(L) -   Intake/Output      07/31 0701 - 08/01 0700 08/01 0701 - 08/02 0700   I.V. (mL/kg) 2450 (27.3)    IV Piggyback 100    Total Intake(mL/kg) 2550 (28.4)    Urine (mL/kg/hr) 1150 (0.5)    Blood 300    Total Output 1450     Net +1100          Urine Occurrence 1 x      Physical Exam: General: NAD.  Supine in bed.  Up to chair during evaluation.  No increased wob.  MSK Splint in place.   Sensation intact distally Feet warm Dorsiflexion/Plantar flexion intact Incision: dressing C/D/I   Prudencio Burly III, PA-C 05/20/2017, 7:58 AM

## 2017-05-21 DIAGNOSIS — M62838 Other muscle spasm: Secondary | ICD-10-CM | POA: Diagnosis not present

## 2017-05-21 DIAGNOSIS — R1110 Vomiting, unspecified: Secondary | ICD-10-CM | POA: Diagnosis not present

## 2017-05-21 DIAGNOSIS — S8990XA Unspecified injury of unspecified lower leg, initial encounter: Secondary | ICD-10-CM | POA: Diagnosis not present

## 2017-05-21 DIAGNOSIS — S82001D Unspecified fracture of right patella, subsequent encounter for closed fracture with routine healing: Secondary | ICD-10-CM

## 2017-05-21 DIAGNOSIS — B379 Candidiasis, unspecified: Secondary | ICD-10-CM | POA: Diagnosis not present

## 2017-05-21 DIAGNOSIS — M6281 Muscle weakness (generalized): Secondary | ICD-10-CM | POA: Diagnosis not present

## 2017-05-21 DIAGNOSIS — E569 Vitamin deficiency, unspecified: Secondary | ICD-10-CM | POA: Diagnosis not present

## 2017-05-21 DIAGNOSIS — S76111D Strain of right quadriceps muscle, fascia and tendon, subsequent encounter: Secondary | ICD-10-CM | POA: Diagnosis not present

## 2017-05-21 DIAGNOSIS — R262 Difficulty in walking, not elsewhere classified: Secondary | ICD-10-CM | POA: Diagnosis not present

## 2017-05-21 DIAGNOSIS — R110 Nausea: Secondary | ICD-10-CM | POA: Diagnosis not present

## 2017-05-21 DIAGNOSIS — S92411D Displaced fracture of proximal phalanx of right great toe, subsequent encounter for fracture with routine healing: Secondary | ICD-10-CM | POA: Diagnosis not present

## 2017-05-21 DIAGNOSIS — K219 Gastro-esophageal reflux disease without esophagitis: Secondary | ICD-10-CM | POA: Diagnosis not present

## 2017-05-21 DIAGNOSIS — M8580 Other specified disorders of bone density and structure, unspecified site: Secondary | ICD-10-CM | POA: Diagnosis not present

## 2017-05-21 DIAGNOSIS — R52 Pain, unspecified: Secondary | ICD-10-CM | POA: Diagnosis not present

## 2017-05-21 DIAGNOSIS — S82091D Other fracture of right patella, subsequent encounter for closed fracture with routine healing: Secondary | ICD-10-CM | POA: Diagnosis not present

## 2017-05-21 DIAGNOSIS — K5900 Constipation, unspecified: Secondary | ICD-10-CM | POA: Diagnosis not present

## 2017-05-21 DIAGNOSIS — Z111 Encounter for screening for respiratory tuberculosis: Secondary | ICD-10-CM | POA: Diagnosis not present

## 2017-05-21 DIAGNOSIS — R278 Other lack of coordination: Secondary | ICD-10-CM | POA: Diagnosis not present

## 2017-05-21 DIAGNOSIS — Z9181 History of falling: Secondary | ICD-10-CM | POA: Diagnosis not present

## 2017-05-21 DIAGNOSIS — G8911 Acute pain due to trauma: Secondary | ICD-10-CM | POA: Diagnosis not present

## 2017-05-21 DIAGNOSIS — I10 Essential (primary) hypertension: Secondary | ICD-10-CM | POA: Diagnosis not present

## 2017-05-21 LAB — BASIC METABOLIC PANEL
Anion gap: 7 (ref 5–15)
BUN: 8 mg/dL (ref 6–20)
CHLORIDE: 100 mmol/L — AB (ref 101–111)
CO2: 26 mmol/L (ref 22–32)
Calcium: 8.1 mg/dL — ABNORMAL LOW (ref 8.9–10.3)
Creatinine, Ser: 0.75 mg/dL (ref 0.61–1.24)
GFR calc non Af Amer: >60 mL/min (ref >60)
Glucose, Bld: 110 mg/dL — ABNORMAL HIGH (ref 65–99)
POTASSIUM: 3.2 mmol/L — AB (ref 3.5–5.1)
SODIUM: 133 mmol/L — AB (ref 135–145)

## 2017-05-21 LAB — CBC
HEMATOCRIT: 23.3 % — AB (ref 39.0–52.0)
HEMOGLOBIN: 8.4 g/dL — AB (ref 13.0–17.0)
MCH: 35.9 pg — AB (ref 26.0–34.0)
MCHC: 36.1 g/dL — ABNORMAL HIGH (ref 30.0–36.0)
MCV: 99.6 fL (ref 78.0–100.0)
Platelets: 281 10*3/uL (ref 150–400)
RBC: 2.34 MIL/uL — AB (ref 4.22–5.81)
RDW: 12.1 % (ref 11.5–15.5)
WBC: 14.6 10*3/uL — ABNORMAL HIGH (ref 4.0–10.5)

## 2017-05-21 LAB — MAGNESIUM: MAGNESIUM: 1.8 mg/dL (ref 1.7–2.4)

## 2017-05-21 MED ORDER — POTASSIUM CHLORIDE CRYS ER 20 MEQ PO TBCR
40.0000 meq | EXTENDED_RELEASE_TABLET | Freq: Once | ORAL | Status: AC
  Administered 2017-05-21: 40 meq via ORAL
  Filled 2017-05-21: qty 2

## 2017-05-21 NOTE — Clinical Social Work Placement (Signed)
   CLINICAL SOCIAL WORK PLACEMENT  NOTE  Date:  05/21/2017  Patient Details  Name: Daniel Andersen MRN: [id number] Date of Birth: Jul 30, 1932  Clinical Social Work is seeking post-discharge placement for this patient at the [place] level of care (*CSW will initial, date and re-position this form in  chart as items are completed):  Yes   Patient/family provided with [place] Work Department's list of facilities offering this level of care within the geographic area requested by the patient (or if unable, by the patient's family).  Yes   Patient/family informed of their freedom to choose among providers that offer the needed level of care, that participate in Medicare, Medicaid or managed care program needed by the patient, have an available bed and are willing to accept the patient.  Yes   Patient/family informed of [facility]'s ownership interest in [facility] and [facility], as well as of the fact that they are under no obligation to receive care at these facilities.  PASRR submitted to EDS on 05/21/17     PASRR number received on 05/21/17     Existing PASRR number confirmed on       FL2 transmitted to all facilities in geographic area requested by pt/family on 05/21/17     FL2 transmitted to all facilities within larger geographic area on       Patient informed that his/her managed care company has contracts with or will negotiate with certain facilities, including the following:        Yes   Patient/family informed of bed offers received.  Patient chooses bed at [facility]     Physician recommends and patient chooses bed at      Patient to be transferred to [facility] on 05/21/17.  Patient to be transferred to facility by [facility]     Patient family notified on 05/21/17 of transfer.  Name of family member notified:  Left VM for sister-Gloria Owens Shark [phone]   [phone])     PHYSICIAN Please prepare  prescriptions     Additional Comment:    _______________________________________________ Truitt Merle, LCSW 05/21/2017, 12:46 PM

## 2017-05-21 NOTE — Progress Notes (Signed)
Patient transferred to [facility] via [facility], verified patient belongings taken with him, report called and given to [facility]. SW called family to notify them of transfer, but did not answer so left message on voicemail.

## 2017-05-21 NOTE — Progress Notes (Signed)
   Assessment / Plan: 2 Days Post-Op  S/P Procedure(s) (LRB): REPAIR QUADRICEP TENDON RIGHT (Right) by Dr. Ernesta Amble. Percell Miller on 05/19/17  Active Problems:   Essential hypertension   Osteopenia   Traumatic rupture of right quadriceps tendon   Closed fracture of right patella   Closed displaced fracture of phalanx of great toe, initial encounter Acute Blood Loss Anemia, Mild.  Currently asymptomatic.  Hgb 9.6<<11.1.  Likely w/ dilutional component.  Plts wnl.  Right Quad Tendon Rupture S/p Repair 05/19/17. Pain controlled.  Mobilizing pretty well w/ therapy, but not safe to return home alone. Must keep leg in full extension.  He has lateral / posterior splint.  Okay to add ABD padding to top / bottom to reduce pressure prn . Up with therapy Incentive Spirometry Elevate and Apply ice  Right Great Toe Fracture: Non-operative management.  Continue Postop shoe when mobilizing.  Weight Bearing: Weight Bearing as Tolerated (WBAT) in full extension full time Dressings: Reinforce PRN.  VTE prophylaxis: Lovenox, SCDs, ambulation Dispo: SNF recommended.  Okay to d/c from an orthopedic perspective when cleared by medicine.   Follow up in the office with Dr. Alain Marion in 2 weeks.  Please call with questions.  Subjective: Patient reports pain as mild, controlled.  Tolerating diet.  Urinating.  +Flatus.  No BM.  No CP, SOB. OOB mobilizing w/ PT.  Objective:   VITALS:   Vitals:   05/20/17 2102 05/20/17 2240 05/21/17 0058 05/21/17 0610  BP: (!) 147/53 (!) 158/52 (!) 162/68 (!) 157/58  Pulse: 64 64 66 64  Resp: 19 18 18 17   Temp: 97.7 F (36.5 C) 97.8 F (36.6 C) 98.5 F (36.9 C) 98.3 F (36.8 C)  TempSrc: Oral Oral Oral Oral  SpO2: 97% 95% 96% 95%  Weight:      Height:       CBC Latest Ref Rng & Units 05/20/2017 05/19/2017 05/18/2017  WBC 4.0 - 10.5 K/uL 18.2(H) 12.3(H) 11.6(H)  Hemoglobin 13.0 - 17.0 g/dL 9.6(L) 10.5(L) 11.1(L)  Hematocrit 39.0 - 52.0 % 27.3(L) 30.4(L) 31.4(L)    Platelets 150 - 400 K/uL 285 274 269   BMP Latest Ref Rng & Units 05/20/2017 05/19/2017 05/18/2017  Glucose 65 - 99 mg/dL 142(H) 119(H) -  BUN 6 - 20 mg/dL 9 17 -  Creatinine 0.61 - 1.24 mg/dL 0.89 1.07 1.29(H)  Sodium 135 - 145 mmol/L 132(L) 133(L) -  Potassium 3.5 - 5.1 mmol/L 3.6 2.8(L) -  Chloride 101 - 111 mmol/L 97(L) 99(L) -  CO2 22 - 32 mmol/L 26 27 -  Calcium 8.9 - 10.3 mg/dL 8.3(L) 8.3(L) -   Intake/Output      08/01 0701 - 08/02 0700   P.O. 960   Total Intake(mL/kg) 960 (10.7)   Urine (mL/kg/hr) 850 (0.4)   Total Output 850   Net +110       Urine Occurrence 5 x     Physical Exam: General: NAD.  Supine in bed.  No increased wob.  MSK Splint in place.   Sensation intact distally Feet warm Dorsiflexion/Plantar flexion intact Bruising Great toe.  EHL/FHL intact w/o significant discomfort. Incision: dressing C/D/I   Prudencio Burly III, PA-C 05/21/2017, 6:34 AM

## 2017-05-21 NOTE — Discharge Summary (Signed)
Physician Discharge Summary  Daniel Andersen UXL:[id number] DOB: 1932-06-02 DOA: 05/18/2017  PCP: Denita Lung, MD  Admit date: 05/18/2017 Discharge date: 05/21/2017  Admitted From: Home  Disposition: SNF   Recommendations for Outpatient Follow-up:  1. Follow up with Orthopedics Dr. Percell Miller and PCP in 2 weeks 2. Please obtain BMP/CBC in 1 week 3. Weight Bearing: Weight Bearing as Tolerated (WBAT) in full extension full time  Discharge Condition: Stable   CODE STATUS: FULL    Brief Hospitalization Summary: Please see all hospital notes, images, labs for full details of the hospitalization.  HPI: Daniel Andersen is an 83 y.o. ambidextrous male with a history of HTN and osteopenia who presented to the ED 7/30 for right knee, shoulder and foot pain following a fall the prior day. He was at a party, when he tripped over a dog, landing directly on the right knee and striking a refrigerator with the right shoulder/arm. He had immediate constant severe nonradiating right knee pain. He has moderate right shoulder pain and right foot pain only with ambulation improved with NSAIDs and ice, though the knee continued to swell so he came for evaluation. On arrival he was in no distress with inability to extend right knee, and imaging demonstrates a patellar avulsion fracture of the right knee. Right foot XR also demonstrated intraarticular fracture of proximal great toe phalanx, and right shoulder XR was negative. Orthopedics consulted and recommended admission, transfer to [facility] for surgical management the following day.   He works out on the treadmill and with weights most mornings, has no dyspnea with exertion. Has had no reactions to anesthesia in the past.   Assessment/Plan:  Active Problems:   Essential hypertension   Osteopenia   Traumatic rupture of right quadriceps tendon   Closed fracture of right patella   Closed displaced fracture of phalanx of great toe, initial encounter  Traumatic  avulsion fracture of the right patella with quadriceps tendon tear. Patient is seen by orthopedics and needed surgical intervention.  POD#1 S/P Procedure(s) (LRB): REPAIR QUADRICEP TENDON RIGHT (Right) by Dr. Ernesta Amble. Murphy on 05/19/17 Postop per orthopedics.  Weight Bearing: Weight Bearing as Tolerated (WBAT) in full extension full time Dressings: Reinforce PRN.  VTE prophylaxis: Lovenox, SCDs, ambulation Dispo: SNF recommended.  Okay to d/c from an orthopedic perspective when cleared by medicine.   Follow up in the office with Dr. Alain Marion in 2 weeks.  Please call with questions.   Hypokalemia Replaced potassium. Recheck BMP in 1 week.   Traumatic fracture of the right great toe. Management per orthopedics.  History of essential Hypertension. Chronic condition. Stable. Continue this bisoprolol as well as his amlodipine. Hold his hydrochlorothiazide.  History of osteopenia. Continue vitamin D. He is also on bisphosphonate at home, which can be resumed at discharge.  Acute blood loss anemia - monitored, recheck CBC in 1 week.   Normocytic anemia and mild leukocytosis. No evidence for overt bleeding. Monitor hemoglobin closely. Elevated WBC thought to be reactive from acute injury.  DVT Prophylaxis: subcutaneous heparin    Code Status: Full code  Family Communication: Discussed with the patient. No family at bedside  Disposition Plan: PT/OT recommending SNF  Discharge Diagnoses:  Active Problems:   Essential hypertension   Osteopenia   Traumatic rupture of right quadriceps tendon   Closed fracture of right patella   Closed displaced fracture of phalanx of great toe, initial encounter    Discharge Instructions:  Allergies as of 05/21/2017   No Known  Allergies     Medication List    STOP taking these medications   aspirin 325 MG tablet Replaced by:  aspirin EC 325 MG tablet     TAKE these medications   alendronate 70 MG tablet Commonly known as:   FOSAMAX TAKE 1 TABLET BY MOUTH ONCE A WEEK WITH A FULL GLASS OF WATER ON AN EMPTY STOMACH   amLODipine 5 MG tablet Commonly known as:  NORVASC TAKE 1 TABLET BY MOUTH EVERY DAY   aspirin EC 325 MG tablet Take 1 tablet (325 mg total) by mouth daily. For 30 days post op for DVT Prophylaxis Replaces:  aspirin 325 MG tablet   baclofen 10 MG tablet Commonly known as:  LIORESAL Take 1 tablet (10 mg total) by mouth 3 (three) times daily as needed for muscle spasms.   bisoprolol-hydrochlorothiazide 5-6.25 MG tablet Commonly known as:  ZIAC TAKE 1 TABLET BY MOUTH EVERY DAY   CENTRUM SILVER ULTRA MENS Tabs Take 1 tablet by mouth daily.   docusate sodium 100 MG capsule Commonly known as:  COLACE Take 1 capsule (100 mg total) by mouth 2 (two) times daily. To prevent constipation while taking pain medication.   fish oil-omega-3 fatty acids 1000 MG capsule Take 1 g by mouth daily.   omeprazole 20 MG capsule Commonly known as:  PRILOSEC Take 1 capsule (20 mg total) by mouth daily. 30 days for gastroprotection while taking Aspirin.   ondansetron 4 MG tablet Commonly known as:  ZOFRAN Take 1 tablet (4 mg total) by mouth every 8 (eight) hours as needed for nausea or vomiting.   oxyCODONE 5 MG immediate release tablet Commonly known as:  ROXICODONE Take 1 tablet (5 mg total) by mouth every 4 (four) hours as needed for severe pain.   oxyCODONE-acetaminophen 5-325 MG tablet Commonly known as:  ROXICET Take 1-2 tablets by mouth every 4 (four) hours as needed for severe pain.   Vitamin D 1000 units capsule Take 1,000 Units by mouth daily.            Durable Medical Equipment        Start     Ordered   05/18/17 1219  For home use only DME standard manual wheelchair with seat cushion  Once    Comments:  Patient suffers from fractured patellar which impairs their ability to perform daily activities like mobility  in the home.  A walking aid will not resolve  issue with performing  activities of daily living. A wheelchair will allow patient to safely perform daily activities. Patient can safely propel the wheelchair in the home or has a caregiver who can provide assistance.  Accessories: elevating leg rests (ELRs), wheel locks, extensions and anti-tippers.   05/18/17 1219   05/18/17 1149  For home use only DME 3 n 1  Once     05/18/17 1155      Contact information for follow-up providers    Renette Butters, MD. Schedule an appointment as soon as possible for a visit in 2 week(s).   Specialty:  Orthopedic Surgery Why:  Hospital Follow Up  Contact information: [address] [place] [address] [phone]        Denita Lung, MD. Schedule an appointment as soon as possible for a visit in 2 week(s).   Specialty:  Family Medicine Contact information: [place] [address] [phone]            Contact information for after-discharge care  Destination    [facility] Follow up.   Specialty:  [place] information: [address] [place] [phone]                 No Known Allergies Current Discharge Medication List    START taking these medications   Details  aspirin EC 325 MG tablet Take 1 tablet (325 mg total) by mouth daily. For 30 days post op for DVT Prophylaxis Qty: 30 tablet, Refills: 0    baclofen (LIORESAL) 10 MG tablet Take 1 tablet (10 mg total) by mouth 3 (three) times daily as needed for muscle spasms. Qty: 40 each, Refills: 0    docusate sodium (COLACE) 100 MG capsule Take 1 capsule (100 mg total) by mouth 2 (two) times daily. To prevent constipation while taking pain medication. Qty: 60 capsule, Refills: 0    omeprazole (PRILOSEC) 20 MG capsule Take 1 capsule (20 mg total) by mouth daily. 30 days for gastroprotection while taking Aspirin. Qty: 30 capsule, Refills: 0    ondansetron (ZOFRAN) 4 MG tablet Take 1  tablet (4 mg total) by mouth every 8 (eight) hours as needed for nausea or vomiting. Qty: 40 tablet, Refills: 0    oxyCODONE (ROXICODONE) 5 MG immediate release tablet Take 1 tablet (5 mg total) by mouth every 4 (four) hours as needed for severe pain. Qty: 12 tablet, Refills: 0    oxyCODONE-acetaminophen (ROXICET) 5-325 MG tablet Take 1-2 tablets by mouth every 4 (four) hours as needed for severe pain. Qty: 60 tablet, Refills: 0      CONTINUE these medications which have NOT CHANGED   Details  alendronate (FOSAMAX) 70 MG tablet TAKE 1 TABLET BY MOUTH ONCE A WEEK WITH A FULL GLASS OF WATER ON AN EMPTY STOMACH Qty: 12 tablet, Refills: 0    amLODipine (NORVASC) 5 MG tablet TAKE 1 TABLET BY MOUTH EVERY DAY Qty: 90 tablet, Refills: 3    bisoprolol-hydrochlorothiazide (ZIAC) 5-6.25 MG tablet TAKE 1 TABLET BY MOUTH EVERY DAY Qty: 90 tablet, Refills: 3    Cholecalciferol (VITAMIN D) 1000 UNITS capsule Take 1,000 Units by mouth daily.     fish oil-omega-3 fatty acids 1000 MG capsule Take 1 g by mouth daily.     Multiple Vitamins-Minerals (CENTRUM SILVER ULTRA MENS) TABS Take 1 tablet by mouth daily.        STOP taking these medications     aspirin 325 MG tablet         Procedures/Studies: Dg Shoulder Right  Result Date: 05/18/2017 CLINICAL DATA:  Status post all yesterday. Persistent anterior shoulder pain. EXAM: RIGHT SHOULDER - 2+ VIEW COMPARISON:  Chest x-ray dated March 21, 2013 which included a portion of the right shoulder. FINDINGS: The bones are subjectively adequately mineralized. There is moderate narrowing of the glenohumeral and AC joint spaces. There is mild narrowing of the subacromial subdeltoid space. No acute fracture is observed. The observed portions of the right clavicle and upper right ribs are normal. IMPRESSION: No acute fracture nor dislocation of the right shoulder is observed. There is moderate osteoarthritic change of the glenohumeral and AC joints.  Electronically Signed   By: David  Martinique M.D.   On: 05/18/2017 10:26   Dg Knee Complete 4 Views Right  Result Date: 05/18/2017 CLINICAL DATA:  83 year old male post fall yesterday. Pain. Initial encounter. EXAM: RIGHT KNEE - COMPLETE 4+ VIEW COMPARISON:  None. FINDINGS: Avulsion fracture of the superior patella suspected with displaced fracture fragments into  suprapatellar region. Thickened distal quadriceps tendon suspicious for a tear. Joint effusion. Vascular calcifications. IMPRESSION: Avulsion fracture of the superior patella suspected with displaced fracture fragments into suprapatellar region. Thickened distal quadriceps tendon suspicious for a tear. Joint effusion. Electronically Signed   By: Genia Del M.D.   On: 05/18/2017 10:29   Dg Foot 2 Views Right  Result Date: 05/18/2017 CLINICAL DATA:  Status post fall yesterday. Bruised and swollen right great toe. EXAM: RIGHT FOOT - 2 VIEW COMPARISON:  None in PACs FINDINGS: The bones are subjectively mildly osteopenic. The patient has sustained an acute fracture through the medial aspect of the base of the proximal phalanx of the great toe. There is pre-existing moderate first MTP joint space narrowing with irregularity of the articular surface of the head of the first metatarsal. The other phalanges appear intact as do the metatarsals. The bones of the hindfoot exhibit no acute abnormality. There is a plantar calcaneal spur. There are arterial calcifications about the ankle. IMPRESSION: There is an acute intra-articular fracture through the medial aspect of the base of the proximal phalanx of the right great toe. This is superimposed upon moderate osteoarthritic change of the first MTP joint. Electronically Signed   By: David  Martinique M.D.   On: 05/18/2017 10:24      Subjective: Pt eager to ambulate more, asking to go to rehab.  Ambulating in room.    Discharge Exam: Vitals:   05/21/17 0058 05/21/17 0610  BP: (!) 162/68 (!) 157/58  Pulse:  66 64  Resp: 18 17  Temp: 98.5 F (36.9 C) 98.3 F (36.8 C)   Vitals:   05/20/17 2102 05/20/17 2240 05/21/17 0058 05/21/17 0610  BP: (!) 147/53 (!) 158/52 (!) 162/68 (!) 157/58  Pulse: 64 64 66 64  Resp: 19 18 18 17   Temp: 97.7 F (36.5 C) 97.8 F (36.6 C) 98.5 F (36.9 C) 98.3 F (36.8 C)  TempSrc: Oral Oral Oral Oral  SpO2: 97% 95% 96% 95%  Weight:      Height:        General: Pt is alert, awake, not in acute distress Cardiovascular: nL S1/S2 +, no rubs, no gallops Respiratory: CTA bilaterally, no wheezing, no rhonchi Abdominal: Soft, NT, ND, bowel sounds + Extremities: Splint in place.  Sensation intact distally. Feet warm. Dorsiflexion/Plantar flexion intact Bruising Great toe.  EHL/FHL intact w/o significant discomfort. Incision: dressing C/D/I   The results of significant diagnostics from this hospitalization (including imaging, microbiology, ancillary and laboratory) are listed below for reference.    Microbiology: Recent Results (from the past 240 hour(s))  Surgical PCR screen     Status: Abnormal   Collection Time: 05/18/17 10:22 PM  Result Value Ref Range Status   MRSA, PCR NEGATIVE NEGATIVE Final   Staphylococcus aureus POSITIVE (A) NEGATIVE Final    Comment:        The Xpert SA Assay (FDA approved for NASAL specimens in patients over 32 years of age), is one component of a comprehensive surveillance program.  Test performance has been validated by [facility] for patients greater than or equal to 2 year old. It is not intended to diagnose infection nor to guide or monitor treatment.      Labs: BNP (last 3 results) No results for input(s): BNP in the last 8760 hours. Basic Metabolic Panel:  Recent Labs Lab 05/18/17 1131 05/18/17 1600 05/19/17 0404 05/19/17 0727 05/20/17 0503 05/21/17 0547  NA 135  --  133*  --  132* 133*  K 3.4*  --  2.8*  --  3.6 3.2*  CL 99*  --  99*  --  97* 100*  CO2 27  --  27  --  26 26  GLUCOSE 137*  --  119*   --  142* 110*  BUN 16  --  17  --  9 8  CREATININE 0.94 1.29* 1.07  --  0.89 0.75  CALCIUM 8.8*  --  8.3*  --  8.3* 8.1*  MG  --   --   --  2.1 1.7 1.8   Liver Function Tests:  Recent Labs Lab 05/18/17 1131  AST 39  ALT 23  ALKPHOS 54  BILITOT 1.1  PROT 6.1*  ALBUMIN 3.1*   No results for input(s): LIPASE, AMYLASE in the last 168 hours. No results for input(s): AMMONIA in the last 168 hours. CBC:  Recent Labs Lab 05/18/17 1131 05/18/17 1600 05/19/17 0404 05/20/17 0503 05/21/17 0547  WBC 14.3* 11.6* 12.3* 18.2* 14.6*  NEUTROABS 10.3*  --   --   --   --   HGB 11.3* 11.1* 10.5* 9.6* 8.4*  HCT 31.3* 31.4* 30.4* 27.3* 23.3*  MCV 97.2 100.3* 99.7 99.6 99.6  PLT 265 269 274 285 281   Cardiac Enzymes: No results for input(s): CKTOTAL, CKMB, CKMBINDEX, TROPONINI in the last 168 hours. BNP: Invalid input(s): POCBNP CBG: No results for input(s): GLUCAP in the last 168 hours. D-Dimer No results for input(s): DDIMER in the last 72 hours. Hgb A1c No results for input(s): HGBA1C in the last 72 hours. Lipid Profile No results for input(s): CHOL, HDL, LDLCALC, TRIG, CHOLHDL, LDLDIRECT in the last 72 hours. Thyroid function studies No results for input(s): TSH, T4TOTAL, T3FREE, THYROIDAB in the last 72 hours.  Invalid input(s): FREET3 Anemia work up No results for input(s): VITAMINB12, FOLATE, FERRITIN, TIBC, IRON, RETICCTPCT in the last 72 hours. Urinalysis    Component Value Date/Time   BILIRUBINUR neg 03/04/2011 0855   PROTEINUR trace 03/04/2011 0855   UROBILINOGEN neg 03/04/2011 0855   NITRITE neg 03/04/2011 0855   LEUKOCYTESUR neg 03/04/2011 0855   Sepsis Labs Invalid input(s): PROCALCITONIN,  WBC,  LACTICIDVEN Microbiology Recent Results (from the past 240 hour(s))  Surgical PCR screen     Status: Abnormal   Collection Time: 05/18/17 10:22 PM  Result Value Ref Range Status   MRSA, PCR NEGATIVE NEGATIVE Final   Staphylococcus aureus POSITIVE (A) NEGATIVE  Final    Comment:        The Xpert SA Assay (FDA approved for NASAL specimens in patients over 34 years of age), is one component of a comprehensive surveillance program.  Test performance has been validated by [facility] for patients greater than or equal to 77 year old. It is not intended to diagnose infection nor to guide or monitor treatment.    Time coordinating discharge: 35 mins  SIGNED:  Irwin Brakeman, MD  [facility] 05/21/2017, 11:51 AM Pager [phone]  If 7PM-7AM, please contact night-coverage [email or web address] Password [email or web address]

## 2017-05-21 NOTE — NC FL2 (Signed)
[place] LEVEL OF CARE SCREENING TOOL     IDENTIFICATION  Patient Name: Daniel Andersen Birthdate: 1932-03-06 Sex: male Admission Date (Current Location): 05/18/2017  [facility] and [place] Number:  Herbalist and Address:  [facility], [place] [address], [place], [place] [address]      Provider Number: [id number]  Attending Physician Name and Address:  Murlean Iba, MD  Relative Name and Phone Number:       Current Level of Care: Hospital Recommended Level of Care: [place] Prior Approval Number:    Date Approved/Denied:   PASRR Number: [id number]  Discharge Plan: SNF    Current Diagnoses: Patient Active Problem List   Diagnosis Date Noted  . Traumatic rupture of right quadriceps tendon 05/18/2017  . Closed fracture of right patella 05/18/2017  . Closed displaced fracture of phalanx of great toe, initial encounter 05/18/2017  . Medicare annual wellness visit, subsequent 05/06/2016  . Vaccine counseling 05/06/2016  . Essential hypertension 04/02/2015  . Impaired fasting glucose 04/02/2015  . History of colonic polyps 04/02/2015  . Osteopenia 04/02/2015  . Obesity 04/02/2015    Orientation RESPIRATION BLADDER Height & Weight     Self, Time, Situation, Place  Normal Continent Weight: 198 lb (89.8 kg) Height:  5' 7.5" (171.5 cm)  BEHAVIORAL SYMPTOMS/MOOD NEUROLOGICAL BOWEL NUTRITION STATUS      Continent Diet (see DC summary)  AMBULATORY STATUS COMMUNICATION OF NEEDS Skin   Limited Assist Verbally Surgical wounds                       Personal Care Assistance Level of Assistance  Bathing, Dressing Bathing Assistance: Limited assistance   Dressing Assistance: Limited assistance     Functional Limitations Info             SPECIAL CARE FACTORS FREQUENCY  PT (By licensed PT), OT (By licensed OT)     PT Frequency: 5/wk OT Frequency: 5/wk            Contractures       Additional Factors Info  Code Status, Allergies Code Status Info: FULL Allergies Info: NKA           Current Medications (05/21/2017):  This is the current hospital active medication list Current Facility-Administered Medications  Medication Dose Route Frequency Provider Last Rate Last Dose  . acetaminophen (TYLENOL) tablet 650 mg  650 mg Oral Q6H PRN Prudencio Burly III, PA-C       Or  . acetaminophen (TYLENOL) suppository 650 mg  650 mg Rectal Q6H PRN Prudencio Burly III, PA-C      . acetaminophen (TYLENOL) tablet 1,000 mg  1,000 mg Oral Q8H Martensen, Charna Elizabeth III, PA-C   1,000 mg at 05/21/17 3976  . amLODipine (NORVASC) tablet 5 mg  5 mg Oral Daily Patrecia Pour, MD   5 mg at 05/21/17 0951  . baclofen (LIORESAL) tablet 10 mg  10 mg Oral TID PRN Prudencio Burly III, PA-C   10 mg at 05/19/17 2053  . bisoprolol (ZEBETA) tablet 5 mg  5 mg Oral Daily Bonnielee Haff, MD   5 mg at 05/21/17 0951  . Chlorhexidine Gluconate Cloth 2 % PADS 6 each  6 each Topical Daily Waldemar Dickens, MD   6 each at 05/20/17 1032  . cholecalciferol (VITAMIN D) tablet 1,000 Units  1,000 Units Oral Daily Patrecia Pour, MD   1,000 Units at 05/21/17  3212  . docusate sodium (COLACE) capsule 100 mg  100 mg Oral BID Prudencio Burly III, PA-C   100 mg at 05/21/17 2482  . enoxaparin (LOVENOX) injection 40 mg  40 mg Subcutaneous Q24H Prudencio Burly III, PA-C   40 mg at 05/21/17 5003  . HYDROmorphone (DILAUDID) injection 0.5-1 mg  0.5-1 mg Intravenous Q4H PRN Prudencio Burly III, PA-C      . ibuprofen (ADVIL,MOTRIN) tablet 600 mg  600 mg Oral Q6H PRN Patrecia Pour, MD   600 mg at 05/18/17 2052  . lactated ringers infusion   Intravenous Continuous Prudencio Burly III, PA-C 100 mL/hr at 05/20/17 1736    . metoCLOPramide (REGLAN) tablet 5-10 mg  5-10 mg Oral Q8H PRN Prudencio Burly III, PA-C       Or  . metoCLOPramide (REGLAN) injection 5-10 mg  5-10 mg  Intravenous Q8H PRN Prudencio Burly III, PA-C      . mupirocin ointment (BACTROBAN) 2 % 1 application  1 application Nasal BID Waldemar Dickens, MD   1 application at 70/48/88 [phone]  . ondansetron (ZOFRAN) tablet 4 mg  4 mg Oral Q6H PRN Prudencio Burly III, PA-C       Or  . ondansetron [facility]) injection 4 mg  4 mg Intravenous Q6H PRN Prudencio Burly III, PA-C      . oxyCODONE (Oxy IR/ROXICODONE) immediate release tablet 5-10 mg  5-10 mg Oral Q4H PRN Prudencio Burly III, PA-C   10 mg at 05/21/17 4503  . senna (SENOKOT) tablet 8.6 mg  1 tablet Oral BID Prudencio Burly III, PA-C   8.6 mg at 05/21/17 8882  . sodium chloride flush (NS) 0.9 % injection 3 mL  3 mL Intravenous Q12H Vance Gather B, MD   3 mL at 05/19/17 1000  . zolpidem (AMBIEN) tablet 5 mg  5 mg Oral QHS PRN Murlean Iba, MD   5 mg at 05/20/17 2131     Discharge Medications: Please see discharge summary for a list of discharge medications.  Relevant Imaging Results:  Relevant Lab Results:   Additional Information SS#: [id number]  Jorge Ny, LCSW

## 2017-05-21 NOTE — Progress Notes (Signed)
   05/20/17 2240  What Happened  Was fall witnessed? No  Was patient injured? No  Patient found on floor  Found by Staff-comment Ivor Costa RN)  Stated prior activity bathroom-unassisted  Follow Up  MD notified ([place] paged x2. No response.)  Time MD notified 2300  Family notified No- patient refusal  Simple treatment Other (comment) (none required)  Adult Fall Risk Assessment  Risk Factor Category (scoring not indicated) Fall has occurred during this admission (document High fall risk)  Patient's Fall Risk High Fall Risk (>13 points)  Adult Fall Risk Interventions  Required Bundle Interventions *See Row Information* High fall risk - low, moderate, and high requirements implemented  Additional Interventions PT/OT need assessed if change in mobility from baseline;Room near nurses station;Use of appropriate toileting equipment (bedpan, BSC, etc.);Other (Comment) (bed alarm)  Screening for Fall Injury Risk  Risk For Fall Injury- See Row Information  None identified  Vitals  Temp 97.8 F (36.6 C)  Temp Source Oral  BP (!) 158/52  BP Location Left Arm  BP Method Automatic  Patient Position (if appropriate) Lying  Pulse Rate 64  Pulse Rate Source Dinamap  Resp 18  Oxygen Therapy  SpO2 95 %  O2 Device Room Air

## 2017-05-21 NOTE — Discharge Instructions (Signed)
Diet: As you were doing prior to hospitalization   Dressing:  Keep splint and dressings on and dry until follow up.  Activity:  Increase activity slowly as tolerated, but follow the weight bearing instructions below.  The rules on driving is that you can not be taking narcotics while you drive, and you must feel in control of the vehicle.    Weight Bearing:   As tolerated only with leg in full extension   To prevent constipation: you may use a stool softener such as -  Colace (over the counter) 100 mg by mouth twice a day  Drink plenty of fluids (prune juice may be helpful) and high fiber foods Miralax (over the counter) for constipation as needed.    Itching:  If you experience itching with your medications, try taking only a single pain pill, or even half a pain pill at a time.  You may take up to 10 pain pills per day, and you can also use benadryl over the counter for itching or also to help with sleep.   Precautions:  If you experience chest pain or shortness of breath - call 911 immediately for transfer to the hospital emergency department!!  If you develop a fever greater that 101 F, purulent drainage from wound, increased redness or drainage from wound, or calf pain -- Call the office at [phone]                                                 Follow- Up Appointment:  Please call for an appointment to be seen in 2 weeks [place] - [phone]   Follow with Primary MD  Daniel Lung, MD  and other consultant's as instructed your Hospitalist MD  Please get a complete blood count and chemistry panel checked by your Primary MD at your next visit, and again as instructed by your Primary MD.  Get Medicines reviewed and adjusted: Please take all your medications with you for your next visit with your Primary MD  Laboratory/radiological data: Please request your Primary MD to go over all hospital tests and procedure/radiological results at the follow up, please ask your  Primary MD to get all Hospital records sent to his/her office.  In some cases, they will be blood work, cultures and biopsy results pending at the time of your discharge. Please request that your primary care M.D. follows up on these results.  Also Note the following: If you experience worsening of your admission symptoms, develop shortness of breath, life threatening emergency, suicidal or homicidal thoughts you must seek medical attention immediately by calling 911 or calling your MD immediately  if symptoms less severe.  You must read complete instructions/literature along with all the possible adverse reactions/side effects for all the Medicines you take and that have been prescribed to you. Take any new Medicines after you have completely understood and accpet all the possible adverse reactions/side effects.   Do not drive when taking Pain medications or sleeping medications (Benzodaizepines)  Do not take more than prescribed Pain, Sleep and Anxiety Medications. It is not advisable to combine anxiety,sleep and pain medications without talking with your primary care practitioner  Special Instructions: If you have smoked or chewed Tobacco  in the last 2 yrs please stop smoking, stop any regular Alcohol  and or any Recreational drug use.  Wear Seat belts  while driving.  Please note: You were cared for by a hospitalist during your hospital stay. Once you are discharged, your primary care physician will handle any further medical issues. Please note that NO REFILLS for any discharge medications will be authorized once you are discharged, as it is imperative that you return to your primary care physician (or establish a relationship with a primary care physician if you do not have one) for your post hospital discharge needs so that they can reassess your need for medications and monitor your lab values.

## 2017-05-21 NOTE — Progress Notes (Addendum)
Physical Therapy Treatment Patient Details Name: Daniel Andersen MRN: [id number] DOB: Dec 10, 1931 Today's Date: 05/21/2017    History of Present Illness Pt is an 83 yo male admitted through ED at [facility] on 05/18/17 following a fall over a dog on 05/17/17 resulting in a right patellar avulsion fx, intraarticular fx of proximal right great toe phalanx. Pt was transferred to [facility] on 05/19/17 and underwent an right quad tendon repair. PMH significant for HTN, osteopenia.    PT Comments    Pt mobilizing well, however limited in ambulation distance secondary to pain and dyspnea. Today's skilled session focused on providing pt with HEP and gait training with RW. Pt expected to d/c to SNF this PM. He should benefit from continued PT to increase functional independence and activity tolerance.    Follow Up Recommendations  SNF;Supervision for mobility/OOB     Equipment Recommendations  Rolling walker with 5" wheels;3in1 (PT)    Recommendations for Other Services OT consult     Precautions / Restrictions Precautions Precautions: Fall Required Braces or Orthoses: Other Brace/Splint Other Brace/Splint: lateral and posterior splinting.  Restrictions Weight Bearing Restrictions: Yes RLE Weight Bearing: Weight bearing as tolerated Other Position/Activity Restrictions: in splint    Mobility  Bed Mobility Overal bed mobility: Needs Assistance Bed Mobility: Supine to Sit     Supine to sit: Min assist     General bed mobility comments: min A to progress L LE off bed  Transfers Overall transfer level: Needs assistance Equipment used: Rolling walker (2 wheeled) Transfers: Sit to/from Stand Sit to Stand: Min guard         General transfer comment: Cueing for hand placement and technique. Min gaurd for safety.  Ambulation/Gait Ambulation/Gait assistance: Min guard Ambulation Distance (Feet): 75 Feet Assistive device: Rolling walker (2 wheeled) Gait Pattern/deviations: Step-through  pattern;Decreased step length - left;Decreased stance time - right;Antalgic Gait velocity: decreased Gait velocity interpretation: Below normal speed for age/gender General Gait Details: Moderate antalgic gait with cues for sequencing and proximity to RW. Pt has increased c/o pain and dyspnea with gait.    Stairs            Wheelchair Mobility    Modified Rankin (Stroke Patients Only)       Balance Overall balance assessment: Needs assistance Sitting-balance support: No upper extremity supported;Feet supported Sitting balance-Leahy Scale: Good     Standing balance support: Bilateral upper extremity supported;During functional activity Standing balance-Leahy Scale: Poor Standing balance comment: reliant on RW for stability in standing                            Cognition Arousal/Alertness: Awake/alert Behavior During Therapy: WFL for tasks assessed/performed Overall Cognitive Status: Within Functional Limits for tasks assessed                                        Exercises Total Joint Exercises Ankle Circles/Pumps: AROM;Both;10 reps;Supine Hip ABduction/ADduction: AAROM;Left;10 reps;Supine Straight Leg Raises: AAROM;10 reps;Supine    General Comments        Pertinent Vitals/Pain Pain Assessment: 0-10 Pain Score: 4  Pain Location: right knee with mobility Pain Descriptors / Indicators: Aching;Grimacing;Guarding;Sore Pain Intervention(s): Limited activity within patient's tolerance;Monitored during session;Repositioned;Ice applied    Home Living  Prior Function            PT Goals (current goals can now be found in the care plan section) Acute Rehab PT Goals Patient Stated Goal: agreeable to rehab then home PT Goal Formulation: With patient Time For Goal Achievement: 05/27/17 Potential to Achieve Goals: Good Progress towards PT goals: Progressing toward goals    Frequency    Min  3X/week      PT Plan Current plan remains appropriate    Co-evaluation              AM-PAC PT "6 Clicks" Daily Activity  Outcome Measure  Difficulty turning over in bed (including adjusting bedclothes, sheets and blankets)?: None Difficulty moving from lying on back to sitting on the side of the bed? : Total Difficulty sitting down on and standing up from a chair with arms (e.g., wheelchair, bedside commode, etc,.)?: Total Help needed moving to and from a bed to chair (including a wheelchair)?: A Little Help needed walking in hospital room?: A Little Help needed climbing 3-5 steps with a railing? : A Lot 6 Click Score: 14    End of Session Equipment Utilized During Treatment: Gait belt;Other (comment) (splinting R knee) Activity Tolerance: Patient tolerated treatment well Patient left: in chair;with call bell/phone within reach Nurse Communication: Mobility status PT Visit Diagnosis: Unsteadiness on feet (R26.81);Muscle weakness (generalized) (M62.81);Difficulty in walking, not elsewhere classified (R26.2);Pain Pain - Right/Left: Right Pain - part of body: Knee     Time: 1157-1221 PT Time Calculation (min) (ACUTE ONLY): 24 min  Charges:  $Gait Training: 8-22 mins $Therapeutic Exercise: 8-22 mins                    G Codes:       Benjiman Core, [place] Pager [phone] Acute Rehab  Allena Katz 05/21/2017, 1:30 PM

## 2017-05-21 NOTE — Progress Notes (Signed)
Orthopedic Tech Progress Note Patient Details:  Daniel Andersen 17-Apr-1932 [id number]  Ortho Devices Type of Ortho Device: Crutches Ortho Device/Splint Location: Rt knee and foot Ortho Device/Splint Interventions: Application   [place] Pink 05/21/2017, 12:52 PM

## 2017-05-21 NOTE — Evaluation (Signed)
Occupational Therapy Evaluation Patient Details Name: Daniel Andersen MRN: [id number] DOB: 1932-08-25 Today's Date: 05/21/2017    History of Present Illness Pt is an 83 yo male admitted through ED at [facility] on 05/18/17 following a fall over a dog on 05/17/17 resulting in a right patellar avulsion fx, intraarticular fx of proximal right great toe phalanx. Pt was transferred to [facility] on 05/19/17 and underwent an right quad tendon repair. PMH significant for HTN, osteopenia.   Clinical Impression   Pt is at min A - Mod A level with toileting and ADLs with decreased strength, activity tolerance and balance. Pt lives at home alone and is planning to d/c to a SNF for further rehab before return home. All education completed and no further acute OT is indicated at this time. Will defer further OT intervention to SNF    Follow Up Recommendations  SNF    Equipment Recommendations  None recommended by OT    Recommendations for Other Services       Precautions / Restrictions Precautions Precautions: Fall Required Braces or Orthoses: Other Brace/Splint Other Brace/Splint: lateral and posterior splinting.  Restrictions Weight Bearing Restrictions: Yes RLE Weight Bearing: Weight bearing as tolerated Other Position/Activity Restrictions: in splint      Mobility Bed Mobility Overal bed mobility: Needs Assistance Bed Mobility: Supine to Sit     Supine to sit: Min assist     General bed mobility comments: pt up in recliner upon arrival  Transfers Overall transfer level: Needs assistance Equipment used: Rolling walker (2 wheeled) Transfers: Sit to/from Stand Sit to Stand: Min guard         General transfer comment: Cueing for hand placement and technique. Min gaurd for safety.    Balance Overall balance assessment: Needs assistance Sitting-balance support: No upper extremity supported;Feet supported Sitting balance-Leahy Scale: Good     Standing balance support: Bilateral upper  extremity supported;During functional activity Standing balance-Leahy Scale: Poor Standing balance comment: reliant on RW for stability in standing                           ADL either performed or assessed with clinical judgement   ADL Overall ADL's : Needs assistance/impaired     Grooming: Wash/dry hands;Wash/dry face;Standing;Min guard   Upper Body Bathing: Set up;Supervision/ safety   Lower Body Bathing: Moderate assistance   Upper Body Dressing : Set up;Supervision/safety   Lower Body Dressing: Moderate assistance   Toilet Transfer: Min guard;Ambulation;RW;Comfort height toilet   Toileting- Clothing Manipulation and Hygiene: Minimal assistance;Sit to/from stand       Functional mobility during ADLs: Min guard;Cueing for safety       Vision Baseline Vision/History: Wears glasses Wears Glasses: At all times Patient Visual Report: No change from baseline                  Pertinent Vitals/Pain Pain Assessment: 0-10 Pain Score: 3  Pain Location: right knee with mobility Pain Descriptors / Indicators: Aching;Grimacing;Guarding;Sore Pain Intervention(s): Monitored during session;Premedicated before session;Ice applied;Repositioned     Hand Dominance Right   Extremity/Trunk Assessment Upper Extremity Assessment Upper Extremity Assessment: Generalized weakness   Lower Extremity Assessment Lower Extremity Assessment: Defer to PT evaluation   Cervical / Trunk Assessment Cervical / Trunk Assessment: Normal   Communication Communication Communication: No difficulties   Cognition Arousal/Alertness: Awake/alert Behavior During Therapy: WFL for tasks assessed/performed Overall Cognitive Status: Within Functional Limits for tasks assessed  General Comments   pt very pleasant and cooperative, [place] expects to be discharged to:: Skilled nursing  facility Living Arrangements: Alone Available Help at Discharge: Family;Available PRN/intermittently Type of Home: House Home Access: Stairs to enter [employer] of Steps: 5 Entrance Stairs-Rails: Right;Left;Can reach both Home Layout: 1/2 bath on main level;Bed/bath upstairs     Bathroom Shower/Tub: Tub/shower unit;Door   Bathroom Toilet: Standard     Home Equipment: Walker - 2 wheels          Prior Functioning/Environment Level of Independence: Independent        Comments: completely independent, going to gym and driving.        OT Problem List: Decreased activity tolerance;Decreased knowledge of use of DME or AE;Pain;Impaired balance (sitting and/or standing);Decreased safety awareness      OT Treatment/Interventions:      OT Goals(Current goals can be found in the care plan section) Acute Rehab OT Goals Patient Stated Goal: agreeable to rehab then home OT Goal Formulation: With patient  OT Frequency:     Barriers to D/C: Decreased caregiver support  planning to d/c to SNF for further rehab before returning home                     AM-PAC PT "6 Clicks" Daily Activity     Outcome Measure Help from another person eating meals?: None Help from another person taking care of personal grooming?: A Little Help from another person toileting, which includes using toliet, bedpan, or urinal?: A Lot Help from another person bathing (including washing, rinsing, drying)?: A Lot Help from another person to put on and taking off regular upper body clothing?: None Help from another person to put on and taking off regular lower body clothing?: A Lot 6 Click Score: 17   End of Session Equipment Utilized During Treatment: Gait belt;Rolling walker  Activity Tolerance: Patient tolerated treatment well Patient left: in chair;with call bell/phone within reach  OT Visit Diagnosis: Unsteadiness on feet (R26.81);Pain                Time: 9211-9417 OT Time  Calculation (min): 21 min Charges:  OT General Charges $OT Visit: 1 Procedure OT Evaluation $OT Eval Moderate Complexity: 1 Procedure G-Codes: OT G-codes **NOT FOR INPATIENT CLASS** Functional Assessment Tool Used: AM-PAC 6 Clicks Daily Activity     Britt Bottom 05/21/2017, 2:13 PM

## 2017-05-21 NOTE — Clinical Social Work Placement (Signed)
   CLINICAL SOCIAL WORK PLACEMENT  NOTE  Date:  05/21/2017  Patient Details  Name: Daniel Andersen MRN: [id number] Date of Birth: 1932/05/02  Clinical Social Work is seeking post-discharge placement for this patient at the [place] level of care (*CSW will initial, date and re-position this form in  chart as items are completed):  Yes   Patient/family provided with [facility] Work Department's list of facilities offering this level of care within the geographic area requested by the patient (or if unable, by the patient's family).  Yes   Patient/family informed of their freedom to choose among providers that offer the needed level of care, that participate in Medicare, Medicaid or managed care program needed by the patient, have an available bed and are willing to accept the patient.  Yes   Patient/family informed of [facility]'s ownership interest in [facility] and [facility], as well as of the fact that they are under no obligation to receive care at these facilities.  PASRR submitted to EDS on 05/21/17     PASRR number received on 05/21/17     Existing PASRR number confirmed on       FL2 transmitted to all facilities in geographic area requested by pt/family on 05/21/17     FL2 transmitted to all facilities within larger geographic area on       Patient informed that his/her managed care company has contracts with or will negotiate with certain facilities, including the following:        Yes   Patient/family informed of bed offers received.  Patient [facility] bed at [facility]     Physician recommends and patient chooses bed at      Patient to be transferred to [facility] on 05/21/17.  Patient to be transferred to facility by [facility]     Patient family notified on 05/21/17 of transfer.  Name of family member notified:  Left VM for daughter-Gloria Owens Shark [phone]   [phone])     PHYSICIAN Please prepare  prescriptions     Additional Comment:    _______________________________________________ Truitt Merle, LCSW 05/21/2017, 2:38 PM

## 2017-05-21 NOTE — Progress Notes (Signed)
Verified MD knew about patient's IV coming out last night and not replacing it, MD Wynetta Emery said it was fine to leave out.

## 2017-05-21 NOTE — Clinical Social Work Note (Signed)
Clinical Social Work Assessment  Patient Details  Name: Daniel Andersen MRN: [id number] Date of Birth: October 23, 1931  Date of referral:  05/21/17               Reason for consult:  Discharge Planning                Permission sought to share information with:  Family Supports Permission granted to share information::  Yes, Verbal Permission Granted  Name::     [name]  Agency::  SNFs  Relationship::  Daughter  Contact Information:  [phone]  Housing/Transportation Living arrangements for the past 2 months:  [place] of Information:  Patient Patient Interpreter Needed:  None Criminal Activity/Legal Involvement Pertinent to Current Situation/Hospitalization:  No - Comment as needed Significant Relationships:  Adult Children Lives with:  Self Do you feel safe going back to the place where you live?  Yes Need for family participation in patient care:  No (Coment)  Care giving concerns:  No care giving concerns identified.    Social Worker assessment / plan: CSW met with pt and son to address consult for new SNF. CSW introduced herself and explained role of social work. P/T is recommending STR at SNF. CSW explained discharging to SNF with Medicare. CSW provided SNF listing for review. Pt agreeable to SNF for STR. CSW left VM with daughter x2, with pt permission.    CSW will sent FL-2 to SNFs. Pt is ready for discharge today and will go to [facility]. Pt is aware and agreeable to discharge plan. CSW sent clinicals to [facility] and communicated with [facility] (admissions) to confirm bed. Room and report provided to RN and put in treatment team sticky note. Transportation arranged with [facility]. CSW is signing off as no further needs identified.  Employment status:  Retired Forensic scientist:  Medicare PT Recommendations:  [place] / Referral to community resources:  [place]  Patient/Family's Response to care:  Pt  appreciative of CSW support and coordination.   Patient/Family's Understanding of and Emotional Response to Diagnosis, Current Treatment, and Prognosis:  Pt understands that he will benefit from rehab prior to returning home.   Emotional Assessment Appearance:  Appears stated age Attitude/Demeanor/Rapport:  Other (Appropriate) Affect (typically observed):  Accepting, Adaptable, Pleasant Orientation:  Oriented to Self, Oriented to Place, Oriented to  Time, Oriented to Situation Alcohol / Substance use:  Other Psych involvement (Current and /or in the community):  No (Comment)  Discharge Needs  Concerns to be addressed:  Care Coordination, Discharge Planning Concerns Readmission within the last 30 days:  No Current discharge risk:  Dependent with Mobility Barriers to Discharge:  Continued Medical Work up   [employer], LCSW 05/21/2017, 4:30 PM

## 2017-05-21 NOTE — Progress Notes (Signed)
   05/20/17 2240  What Happened  Was fall witnessed? No  Was patient injured? No  Patient found on floor  Found by Staff-comment Ivor Costa RN)  Stated prior activity bathroom-unassisted  Follow Up  MD notified (K.Schorr paged. )  Time MD notified 2300  Family notified No- patient refusal  Simple treatment Other (comment) (none required)  Adult Fall Risk Assessment  Risk Factor Category (scoring not indicated) Fall has occurred during this admission (document High fall risk)  Patient's Fall Risk High Fall Risk (>13 points)  Adult Fall Risk Interventions  Required Bundle Interventions *See Row Information* High fall risk - low, moderate, and high requirements implemented  Additional Interventions PT/OT need assessed if change in mobility from baseline;Room near nurses station;Use of appropriate toileting equipment (bedpan, BSC, etc.);Other (Comment) (bed alarm)  Screening for Fall Injury Risk  Risk For Fall Injury- See Row Information  None identified  Vitals  Temp 97.8 F (36.6 C)  Temp Source Oral  BP (!) 158/52  BP Location Left Arm  BP Method Automatic  Patient Position (if appropriate) Lying  Pulse Rate 64  Pulse Rate Source Dinamap  Resp 18  Oxygen Therapy  SpO2 95 %  O2 Device Room Air

## 2017-05-22 DIAGNOSIS — S82091D Other fracture of right patella, subsequent encounter for closed fracture with routine healing: Secondary | ICD-10-CM | POA: Diagnosis not present

## 2017-05-22 DIAGNOSIS — I10 Essential (primary) hypertension: Secondary | ICD-10-CM | POA: Diagnosis not present

## 2017-05-27 DIAGNOSIS — S76111D Strain of right quadriceps muscle, fascia and tendon, subsequent encounter: Secondary | ICD-10-CM | POA: Diagnosis not present

## 2017-06-01 DIAGNOSIS — S76111D Strain of right quadriceps muscle, fascia and tendon, subsequent encounter: Secondary | ICD-10-CM | POA: Diagnosis not present

## 2017-06-02 ENCOUNTER — Other Ambulatory Visit: Payer: Self-pay | Admitting: Medical

## 2017-06-02 NOTE — Anesthesia Postprocedure Evaluation (Signed)
Anesthesia Post Note  Patient: Daniel Andersen  Procedure(s) Performed: Procedure(s) (LRB): REPAIR QUADRICEP TENDON RIGHT (Right)     Patient location during evaluation: PACU Anesthesia Type: Spinal Level of consciousness: awake and alert Pain management: pain level controlled Vital Signs Assessment: post-procedure vital signs reviewed and stable Respiratory status: spontaneous breathing, nonlabored ventilation, respiratory function stable and patient connected to nasal cannula oxygen Cardiovascular status: stable and blood pressure returned to baseline Anesthetic complications: no    Last Vitals:  Vitals:   05/21/17 0610 05/21/17 1410  BP: (!) 157/58 (!) 133/46  Pulse: 64 64  Resp: 17 18  Temp: 36.8 C 37.1 C  SpO2: 95% 99%    Last Pain:  Vitals:   05/21/17 1410  TempSrc: Oral  PainSc:                  Sufyaan Palma,JAMES TERRILL

## 2017-06-06 DIAGNOSIS — B379 Candidiasis, unspecified: Secondary | ICD-10-CM | POA: Diagnosis not present

## 2017-06-15 DIAGNOSIS — S76111D Strain of right quadriceps muscle, fascia and tendon, subsequent encounter: Secondary | ICD-10-CM | POA: Diagnosis not present

## 2017-06-18 DIAGNOSIS — S82091D Other fracture of right patella, subsequent encounter for closed fracture with routine healing: Secondary | ICD-10-CM | POA: Diagnosis not present

## 2017-06-18 DIAGNOSIS — I10 Essential (primary) hypertension: Secondary | ICD-10-CM | POA: Diagnosis not present

## 2017-06-18 DIAGNOSIS — M6281 Muscle weakness (generalized): Secondary | ICD-10-CM | POA: Diagnosis not present

## 2017-06-29 DIAGNOSIS — R531 Weakness: Secondary | ICD-10-CM | POA: Diagnosis not present

## 2017-06-29 DIAGNOSIS — M25561 Pain in right knee: Secondary | ICD-10-CM | POA: Diagnosis not present

## 2017-06-29 DIAGNOSIS — S76191D Other specified injury of right quadriceps muscle, fascia and tendon, subsequent encounter: Secondary | ICD-10-CM | POA: Diagnosis not present

## 2017-06-29 DIAGNOSIS — S76111D Strain of right quadriceps muscle, fascia and tendon, subsequent encounter: Secondary | ICD-10-CM | POA: Diagnosis not present

## 2017-07-02 DIAGNOSIS — M25561 Pain in right knee: Secondary | ICD-10-CM | POA: Diagnosis not present

## 2017-07-02 DIAGNOSIS — R531 Weakness: Secondary | ICD-10-CM | POA: Diagnosis not present

## 2017-07-02 DIAGNOSIS — S76111D Strain of right quadriceps muscle, fascia and tendon, subsequent encounter: Secondary | ICD-10-CM | POA: Diagnosis not present

## 2017-07-02 DIAGNOSIS — S76191D Other specified injury of right quadriceps muscle, fascia and tendon, subsequent encounter: Secondary | ICD-10-CM | POA: Diagnosis not present

## 2017-07-03 DIAGNOSIS — S76111D Strain of right quadriceps muscle, fascia and tendon, subsequent encounter: Secondary | ICD-10-CM | POA: Diagnosis not present

## 2017-07-06 DIAGNOSIS — S76191D Other specified injury of right quadriceps muscle, fascia and tendon, subsequent encounter: Secondary | ICD-10-CM | POA: Diagnosis not present

## 2017-07-06 DIAGNOSIS — R531 Weakness: Secondary | ICD-10-CM | POA: Diagnosis not present

## 2017-07-06 DIAGNOSIS — M25561 Pain in right knee: Secondary | ICD-10-CM | POA: Diagnosis not present

## 2017-07-06 DIAGNOSIS — S76111D Strain of right quadriceps muscle, fascia and tendon, subsequent encounter: Secondary | ICD-10-CM | POA: Diagnosis not present

## 2017-07-09 ENCOUNTER — Other Ambulatory Visit: Payer: Self-pay | Admitting: Medical

## 2017-07-09 DIAGNOSIS — S76191D Other specified injury of right quadriceps muscle, fascia and tendon, subsequent encounter: Secondary | ICD-10-CM | POA: Diagnosis not present

## 2017-07-09 DIAGNOSIS — M25561 Pain in right knee: Secondary | ICD-10-CM | POA: Diagnosis not present

## 2017-07-09 DIAGNOSIS — S76111D Strain of right quadriceps muscle, fascia and tendon, subsequent encounter: Secondary | ICD-10-CM | POA: Diagnosis not present

## 2017-07-09 DIAGNOSIS — R531 Weakness: Secondary | ICD-10-CM | POA: Diagnosis not present

## 2017-07-13 DIAGNOSIS — S76111D Strain of right quadriceps muscle, fascia and tendon, subsequent encounter: Secondary | ICD-10-CM | POA: Diagnosis not present

## 2017-07-14 DIAGNOSIS — M25561 Pain in right knee: Secondary | ICD-10-CM | POA: Diagnosis not present

## 2017-07-14 DIAGNOSIS — S76191D Other specified injury of right quadriceps muscle, fascia and tendon, subsequent encounter: Secondary | ICD-10-CM | POA: Diagnosis not present

## 2017-07-14 DIAGNOSIS — S76111D Strain of right quadriceps muscle, fascia and tendon, subsequent encounter: Secondary | ICD-10-CM | POA: Diagnosis not present

## 2017-07-14 DIAGNOSIS — R531 Weakness: Secondary | ICD-10-CM | POA: Diagnosis not present

## 2017-07-16 DIAGNOSIS — M25561 Pain in right knee: Secondary | ICD-10-CM | POA: Diagnosis not present

## 2017-07-16 DIAGNOSIS — S76191D Other specified injury of right quadriceps muscle, fascia and tendon, subsequent encounter: Secondary | ICD-10-CM | POA: Diagnosis not present

## 2017-07-16 DIAGNOSIS — S76111D Strain of right quadriceps muscle, fascia and tendon, subsequent encounter: Secondary | ICD-10-CM | POA: Diagnosis not present

## 2017-07-16 DIAGNOSIS — R531 Weakness: Secondary | ICD-10-CM | POA: Diagnosis not present

## 2017-07-20 ENCOUNTER — Other Ambulatory Visit: Payer: Self-pay | Admitting: Medical

## 2017-07-20 NOTE — Telephone Encounter (Signed)
Is this ok to refill?  

## 2017-07-20 NOTE — Telephone Encounter (Signed)
Your patient 

## 2017-07-21 NOTE — Telephone Encounter (Signed)
Refuse and schedule medication wellness/med check plus

## 2017-07-23 ENCOUNTER — Other Ambulatory Visit: Payer: Self-pay | Admitting: Medical

## 2017-08-17 DIAGNOSIS — M25561 Pain in right knee: Secondary | ICD-10-CM | POA: Diagnosis not present

## 2017-08-20 DIAGNOSIS — Z23 Encounter for immunization: Secondary | ICD-10-CM | POA: Diagnosis not present

## 2017-09-14 ENCOUNTER — Other Ambulatory Visit: Payer: Self-pay | Admitting: Medical

## 2017-09-21 DIAGNOSIS — M25561 Pain in right knee: Secondary | ICD-10-CM | POA: Diagnosis not present

## 2017-09-29 ENCOUNTER — Encounter: Payer: Self-pay | Admitting: Medical

## 2017-10-05 ENCOUNTER — Other Ambulatory Visit: Payer: Self-pay | Admitting: Medical

## 2017-10-11 ENCOUNTER — Other Ambulatory Visit: Payer: Self-pay | Admitting: Medical

## 2017-10-12 ENCOUNTER — Other Ambulatory Visit: Payer: Self-pay | Admitting: Medical

## 2017-11-16 ENCOUNTER — Other Ambulatory Visit: Payer: Self-pay | Admitting: Medical

## 2017-12-14 ENCOUNTER — Other Ambulatory Visit: Payer: Self-pay | Admitting: Medical

## 2017-12-14 NOTE — Telephone Encounter (Signed)
Pt is scheduled on march 26th. I will refill for 30 days

## 2018-01-12 ENCOUNTER — Other Ambulatory Visit: Payer: Self-pay | Admitting: Medical

## 2018-01-12 ENCOUNTER — Encounter: Payer: Self-pay | Admitting: Medical

## 2018-01-12 ENCOUNTER — Ambulatory Visit (INDEPENDENT_AMBULATORY_CARE_PROVIDER_SITE_OTHER): Payer: Medicare Other | Admitting: Medical

## 2018-01-12 VITALS — BP 124/76 | HR 55 | Ht 63.75 in | Wt 194.0 lb

## 2018-01-12 DIAGNOSIS — Z9181 History of falling: Secondary | ICD-10-CM | POA: Diagnosis not present

## 2018-01-12 DIAGNOSIS — E669 Obesity, unspecified: Secondary | ICD-10-CM | POA: Diagnosis not present

## 2018-01-12 DIAGNOSIS — Z8781 Personal history of (healed) traumatic fracture: Secondary | ICD-10-CM | POA: Diagnosis not present

## 2018-01-12 DIAGNOSIS — Z0000 Encounter for general adult medical examination without abnormal findings: Secondary | ICD-10-CM | POA: Diagnosis not present

## 2018-01-12 DIAGNOSIS — Z7185 Encounter for immunization safety counseling: Secondary | ICD-10-CM

## 2018-01-12 DIAGNOSIS — Z7189 Other specified counseling: Secondary | ICD-10-CM | POA: Diagnosis not present

## 2018-01-12 DIAGNOSIS — R609 Edema, unspecified: Secondary | ICD-10-CM | POA: Diagnosis not present

## 2018-01-12 DIAGNOSIS — E559 Vitamin D deficiency, unspecified: Secondary | ICD-10-CM | POA: Diagnosis not present

## 2018-01-12 DIAGNOSIS — I10 Essential (primary) hypertension: Secondary | ICD-10-CM | POA: Diagnosis not present

## 2018-01-12 DIAGNOSIS — M8580 Other specified disorders of bone density and structure, unspecified site: Secondary | ICD-10-CM | POA: Diagnosis not present

## 2018-01-12 DIAGNOSIS — R7301 Impaired fasting glucose: Secondary | ICD-10-CM | POA: Diagnosis not present

## 2018-01-12 MED ORDER — BISOPROLOL-HYDROCHLOROTHIAZIDE 5-6.25 MG PO TABS: 1.0000 | ORAL_TABLET | Freq: Every day | ORAL | 3 refills | Status: DC

## 2018-01-12 MED ORDER — AMLODIPINE BESYLATE 5 MG PO TABS: 5.0000 mg | ORAL_TABLET | Freq: Every day | ORAL | 3 refills | Status: DC

## 2018-01-12 MED ORDER — ALENDRONATE SODIUM 70 MG PO TABS: ORAL_TABLET | ORAL | 3 refills | Status: DC

## 2018-01-12 NOTE — Patient Instructions (Addendum)
Thanks for trusting [place] with your health care and for coming in for a physical today.  Below are some general recommendations I have for you:  Yearly screenings See your eye doctor yearly for routine vision care. See your dentist yearly for routine dental care including hygiene visits twice yearly. See me here yearly for a routine physical and preventative care visit   Specific Concerns today:  I recommend you have an updated bone density test given your history of osteopenia, fracture this past year and vitamin D deficiency.  Please call [facility] to schedule bone denstiy test.    [facility] [phone]  [address], [place], [place] [address]  [address], [place] [address]   Check with your insurance for coverage for the Shingrix/shingles vaccine I recommend you have this.  If you have not completed advanced directives such as living will and healthcare power of attorney, I recommend you do this.  Follow-up with orthopedist about your knees and possibly getting back into physical therapy to speed up your recovery.   Please follow up yearly for a physical.   Preventative Care for Adults - Male      [place]:  A routine yearly physical is a good way to check in with your primary care provider about your health and preventive screening. It is also an opportunity to share updates about your health and any concerns you have, and receive a thorough all-over exam.   Most health insurance companies pay for at least some preventative services.  Check with your health plan for specific coverages.  WHAT PREVENTATIVE SERVICES DO WOMEN NEED?  Adult men should have their weight and blood pressure checked regularly.   Men age 12 and older should have their cholesterol levels checked regularly.  Beginning at age 79 and continuing to age 87, men should be screened for colorectal cancer.  Certain people may need continued  testing until age 71.  Updating vaccinations is part of preventative care.  Vaccinations help protect against diseases such as the flu.  Osteoporosis is a disease in which the bones lose minerals and strength as we age. Men ages 28 and over should discuss this with their caregivers  Lab tests are generally done as part of preventative care to screen for anemia and blood disorders, to screen for problems with the kidneys and liver, to screen for bladder problems, to check blood sugar, and to check your cholesterol level.  Preventative services generally include counseling about diet, exercise, avoiding tobacco, drugs, excessive alcohol consumption, and sexually transmitted infections.    GENERAL RECOMMENDATIONS FOR GOOD HEALTH:  Healthy diet:  Eat a variety of foods, including fruit, vegetables, animal or vegetable protein, such as meat, fish, chicken, and eggs, or beans, lentils, tofu, and grains, such as rice.  Drink plenty of water daily.  Decrease saturated fat in the diet, avoid lots of red meat, processed foods, sweets, fast foods, and fried foods.  Exercise:  Aerobic exercise helps maintain good heart health. At least 30-40 minutes of moderate-intensity exercise is recommended. For example, a brisk walk that increases your heart rate and breathing. This should be done on most days of the week.   Find a type of exercise or a variety of exercises that you enjoy so that it becomes a part of your daily life.  Examples are running, walking, swimming, water aerobics, and biking.  For motivation and support, explore group exercise such as aerobic class, spin class, Zumba,  Yoga,or  martial arts, etc.    Set exercise goals for yourself, such as a certain weight goal, walk or run in a race such as a 5k walk/run.  Speak to your primary care provider about exercise goals.  Disease prevention:  If you smoke or chew tobacco, find out from your caregiver how to quit. It can literally save your  life, no matter how long you have been a tobacco user. If you do not use tobacco, never begin.   Maintain a healthy diet and normal weight. Increased weight leads to problems with blood pressure and diabetes.   The Body Mass Index or BMI is a way of measuring how much of your body is fat. Having a BMI above 27 increases the risk of heart disease, diabetes, hypertension, stroke and other problems related to obesity. Your caregiver can help determine your BMI and based on it develop an exercise and dietary program to help you achieve or maintain this important measurement at a healthful level.  High blood pressure causes heart and blood vessel problems.  Persistent high blood pressure should be treated with medicine if weight loss and exercise do not work.   Fat and cholesterol leaves deposits in your arteries that can block them. This causes heart disease and vessel disease elsewhere in your body.  If your cholesterol is found to be high, or if you have heart disease or certain other medical conditions, then you may need to have your cholesterol monitored frequently and be treated with medication.   Ask if you should have a cardiac stress test if your history suggests this. A stress test is a test done on a treadmill that looks for heart disease. This test can find disease prior to there being a problem.  Osteoporosis is a disease in which the bones lose minerals and strength as we age. This can result in serious bone fractures. Risk of osteoporosis can be identified using a bone density scan. Men ages 39 and over should discuss this with their caregivers. Ask your caregiver whether you should be taking a calcium supplement and Vitamin D, to reduce the rate of osteoporosis.   Avoid drinking alcohol in excess (more than two drinks per day).  Avoid use of street drugs. Do not share needles with anyone. Ask for professional help if you need assistance or instructions on stopping the use of alcohol,  cigarettes, and/or drugs.  Brush your teeth twice a day with fluoride toothpaste, and floss once a day. Good oral hygiene prevents tooth decay and gum disease. The problems can be painful, unattractive, and can cause other health problems. Visit your dentist for a routine oral and dental check up and preventive care every 6-12 months.   Look at your skin regularly.  Use a mirror to look at your back. Notify your caregivers of changes in moles, especially if there are changes in shapes, colors, a size larger than a pencil eraser, an irregular border, or development of new moles.  Safety:  Use seatbelts 100% of the time, whether driving or as a passenger.  Use safety devices such as hearing protection if you work in environments with loud noise or significant background noise.  Use safety glasses when doing any work that could send debris in to the eyes.  Use a helmet if you ride a bike or motorcycle.  Use appropriate safety gear for contact sports.  Talk to your caregiver about gun safety.  Use sunscreen with a SPF (or skin protection factor) of  15 or greater.  Lighter skinned people are at a greater risk of skin cancer. Don't forget to also wear sunglasses in order to protect your eyes from too much damaging sunlight. Damaging sunlight can accelerate cataract formation.   Practice safe sex. Use condoms. Condoms are used for birth control and to help reduce the spread of sexually transmitted infections (or STIs).  Some of the STIs are gonorrhea (the clap), chlamydia, syphilis, trichomonas, herpes, HPV (human papilloma virus) and HIV (human immunodeficiency virus) which causes AIDS. The herpes, HIV and HPV are viral illnesses that have no cure. These can result in disability, cancer and death.   Keep carbon monoxide and smoke detectors in your home functioning at all times. Change the batteries every 6 months or use a model that plugs into the wall.   Vaccinations:  Stay up to date with your tetanus  shots and other required immunizations. You should have a booster for tetanus every 10 years. Be sure to get your flu shot every year, since 5%-20% of the [place] population comes down with the flu. The flu vaccine changes each year, so being vaccinated once is not enough. Get your shot in the fall, before the flu season peaks.   Other vaccines to consider:  Human Papilloma Virus or HPV causes cancer of the cervix, and other infections that can be transmitted from person to person. There is a vaccine for HPV, and males should get immunized between the ages of 30 and 70. It requires a series of 3 shots.   Pneumococcal vaccine to protect against certain types of pneumonia.  This is normally recommended for adults age 35 or older.  However, adults younger than 82 years old with certain underlying conditions such as diabetes, heart or lung disease should also receive the vaccine.  Shingles vaccine to protect against Varicella Zoster if you are older than age 55, or younger than 82 years old with certain underlying illness.  If you have not had the Shingrix vaccine, please call your insurer to inquire about coverage for the Shingrix vaccine given in 2 doses.   Some insurers cover this vaccine after age 30, some cover this after age 83.  If your insurer covers this, then call to schedule appointment to have this vaccine here  Hepatitis A vaccine to protect against a form of infection of the liver by a virus acquired from food.  Hepatitis B vaccine to protect against a form of infection of the liver by a virus acquired from blood or body fluids, particularly if you work in health care.  If you plan to travel internationally, check with your local health department for specific vaccination recommendations.   What should I know about cancer screening? Many types of cancers can be detected early and may often be prevented. Lung Cancer  You should be screened every year for lung cancer if: ? You are a current  smoker who has smoked for at least 30 years. ? You are a former smoker who has quit within the past 15 years.  Talk to your health care provider about your screening options, when you should start screening, and how often you should be screened.  Colorectal Cancer  Routine colorectal cancer screening usually begins at 82 years of age and should be repeated every 5-10 years until you are 82 years old. You may need to be screened more often if early forms of precancerous polyps or small growths are found. Your health care provider may recommend screening at an  earlier age if you have risk factors for colon cancer.  Your health care provider may recommend using home test kits to check for hidden blood in the stool.  A small camera at the end of a tube can be used to examine your colon (sigmoidoscopy or colonoscopy). This checks for the earliest forms of colorectal cancer.  Prostate and Testicular Cancer  Depending on your age and overall health, your health care provider may do certain tests to screen for prostate and testicular cancer.  Talk to your health care provider about any symptoms or concerns you have about testicular or prostate cancer.  Skin Cancer  Check your skin from head to toe regularly.  Tell your health care provider about any new moles or changes in moles, especially if: ? There is a change in a mole's size, shape, or color. ? You have a mole that is larger than a pencil eraser.  Always use sunscreen. Apply sunscreen liberally and repeat throughout the day.  Protect yourself by wearing long sleeves, pants, a wide-brimmed hat, and sunglasses when outside.

## 2018-01-12 NOTE — Progress Notes (Signed)
Subjective:    Daniel Andersen is a 84 y.o. male who presents for Preventative Services visit and chronic medical problems/med check visit.    Primary Care Provider Denita Lung, MD and Dorothea Ogle, PA-C here for primary care  Current Health Care Team:  Dentist,- hasn't seen one in a while  Eye doctor- hasn't seen one in a while  Medical Services you may have received from other than [facility] providers in the past year (date may be approximate)  Dr. Percell Miller- ortho  Exercise Current exercise habits: none recently due to fracture and limitations since summer 2018   Nutrition/Diet Current diet: no specific restrictions, drinks 2 beers nightly   Depression Screen Depression screen [facility] 2/9 01/12/2018  Decreased Interest 0  Down, Depressed, Hopeless 0  PHQ - 2 Score 0    Activities of Daily Living Screen/Functional Status Survey Is the patient deaf or have difficulty hearing?: Yes(having wax built up, going to ENT for wax removal) Does the patient have difficulty seeing, even when wearing glasses/contacts?: No Does the patient have difficulty concentrating, remembering, or making decisions?: No Does the patient have difficulty walking or climbing stairs?: Yes(has a walker) Does the patient have difficulty dressing or bathing?: Yes(trouble getting in and out of bath) Does the patient have difficulty doing errands alone such as visiting a doctor's office or shopping?: No  Can patient draw a clock face showing 3:15 oclock, yes  Fall Risk Screen Fall Risk  01/12/2018 05/06/2016 04/02/2015 03/17/2014  Falls in the past year? Yes No No No  Number falls in past yr: 1 - - -  Injury with Fall? Yes - - -    Gait Assessment: Normal gait observed no, using walker   Past Medical History:  Diagnosis Date  . Hypertension   . Low bone mass 03/2013   Bone Density scan  . Obesity   . Shingles 2005  . Wears glasses   . Wears partial dentures     Past Surgical History:  Procedure  Laterality Date  . COLONOSCOPY W/ BIOPSIES AND POLYPECTOMY  2010   Benign polyps, repeat in 5 years  . INGUINAL HERNIA REPAIR Right 11/2004  . QUADRICEPS TENDON REPAIR Right 05/19/2017   Procedure: REPAIR QUADRICEP TENDON RIGHT;  Surgeon: Renette Butters, MD;  Location: [facility];  Service: Orthopedics;  Laterality: Right;  . TONSILLECTOMY      Social History   Socioeconomic History  . Marital status: Widowed    Spouse name: Not on file  . Number of children: Not on file  . Years of education: Not on file  . Highest education level: Not on file  Occupational History  . Occupation: retired, works part time - [employer]  Social Needs  . Financial resource strain: Not on file  . Food insecurity:    Worry: Not on file    Inability: Not on file  . Transportation needs:    Medical: Not on file    Non-medical: Not on file  Tobacco Use  . Smoking status: Former Smoker    Packs/day: 0.50    Years: 20.00    Pack years: 10.00    Types: Cigarettes  . Smokeless tobacco: Never Used  Substance and Sexual Activity  . Alcohol use: Yes    Alcohol/week: 7.2 oz    Types: 12 Cans of beer per week  . Drug use: No  . Sexual activity: Not on file    Comment: widowed; exercises daily with weights and aerobic  Lifestyle  .  Physical activity:    Days per week: Not on file    Minutes per session: Not on file  . Stress: Not on file  Relationships  . Social connections:    Talks on phone: Not on file    Gets together: Not on file    Attends religious service: Not on file    Active member of club or organization: Not on file    Attends meetings of clubs or organizations: Not on file    Relationship status: Not on file  . Intimate partner violence:    Fear of current or ex partner: Not on file    Emotionally abused: Not on file    Physically abused: Not on file    Forced sexual activity: Not on file  Other Topics Concern  . Not on file  Social History Narrative   Widowed, daughter  in [place], sees her few times per week, exercising - gym every morning - cardio with treadmill, machine weights.   Works at [employer].    Travels up to his hometown in [place] occasionally    No family history on file.   Current Outpatient Medications:  .  alendronate (FOSAMAX) 70 MG tablet, TAKE 1 TABLET BY MOUTH ONCE A WEEK WITH A FULL GLASS OF WATER ON AN EMPTY STOMACH, Disp: 12 tablet, Rfl: 3 .  amLODipine (NORVASC) 5 MG tablet, Take 1 tablet (5 mg total) by mouth daily., Disp: 90 tablet, Rfl: 3 .  aspirin EC 325 MG tablet, Take 1 tablet (325 mg total) by mouth daily. For 30 days post op for DVT Prophylaxis, Disp: 30 tablet, Rfl: 0 .  bisoprolol-hydrochlorothiazide (ZIAC) 5-6.25 MG tablet, Take 1 tablet by mouth daily., Disp: 90 tablet, Rfl: 3 .  Cholecalciferol (VITAMIN D) 1000 UNITS capsule, Take 1,000 Units by mouth daily. , Disp: , Rfl:  .  fish oil-omega-3 fatty acids 1000 MG capsule, Take 1 g by mouth daily. , Disp: , Rfl:  .  Multiple Vitamins-Minerals (CENTRUM SILVER ULTRA MENS) TABS, Take 1 tablet by mouth daily.  , Disp: , Rfl:   No Known Allergies  History reviewed: allergies, current medications, past family history, past medical history, past social history, past surgical history and problem list  Chronic issues discussed: Ended up having a fall, tripping over a pit bull at a pool party this past July, broke his knee, had surgery.  Using walker in public but not having to use walker at home.  Has had some falls due to weakness of right knee.  Having some left knee pain, using the left knee more to help take pressure off right knee.   Hypertension-compliant with current medication  Acute issues discussed: none  Objective:      Biometrics BP 124/76   Pulse (!) 55   Ht 5' 3.75" (1.619 m)   Wt 194 lb (88 kg)   BMI 33.56 kg/m   Wt Readings from Last 3 Encounters:  01/12/18 194 lb (88 kg)  05/18/17 198 lb (89.8 kg)  05/06/16 197  lb (89.4 kg)    Cognitive Testing  Alert? Yes  Normal Appearance?Yes  Oriented to person? Yes  Place? Yes   Time? Yes  Recall of three objects?  Yes  Can perform simple calculations? Yes  Displays appropriate judgment?Yes  Can read the correct time from a watch face?Yes  General appearance: alert, no distress, WD/WN, white male  Nutritional Status: Inadequate calore intake? no Loss of muscle mass? no Loss of fat  beneath skin? no Localized or general edema? no Diminished functional status? no  Other pertinent exam: Skin: scatterd stuck on appearing lesions of torso c/w seborrheic keratoses, scattered benign appearing macules HEENT: normocephalic, sclerae anicteric, TMs pearly, nares patent, no discharge or erythema, pharynx normal Oral cavity: MMM, no lesions Neck: supple, no lymphadenopathy, no thyromegaly, no masses, no bruits Heart: RRR, normal S1, S2, no murmurs Lungs: CTA bilaterally, no wheezes, rhonchi, or rales Abdomen: +bs, soft, non tender, non distended, no masses, no hepatomegaly, no splenomegaly Musculoskeletal: nontender, no swelling, no obvious deformity Extremities:1+ bilat LE pitting edema, no cyanosis, no clubbing Pulses: 1+ symmetric, upper and lower extremities, normal cap refill Neurological: alert, oriented x 3, CN2-12 intact, strength normal upper extremities and lower extremities, sensation normal throughout, DTRs 2+ throughout, no cerebellar signs, gait cautions, using walker, favoring left leg Psychiatric: normal affect, behavior normal, pleasant    Adult ECG Report  Indication: HTN  Rate: 56 bpm  Rhythm: sinus bradycardia  QRS Axis: 13 degrees  PR Interval: 116ms  QRS Duration: 71ms  QTc: 474ms  Conduction Disturbances: Q in III  Other Abnormalities: none  Patient's cardiac risk factors are: advanced age (older than 15 for men, 24 for women), dyslipidemia, hypertension, male gender, obesity (BMI >= 30 kg/m2) and sedentary lifestyle.  EKG  comparison: unchanged from 04/2017 EKG  Narrative Interpretation: abnormal EKG but no acute chagnes     Assessment:   Encounter Diagnoses  Name Primary?  . Essential hypertension Yes  . Medicare annual wellness visit, subsequent   . Impaired fasting glucose   . Osteopenia, unspecified location   . Obesity with serious comorbidity, unspecified classification, unspecified obesity type   . Vaccine counseling   . Vitamin D deficiency   . History of fracture   . History of fall   . Edema, unspecified type      Plan:   A preventative services visit was completed today.  During the course of the visit today, we discussed and counseled about appropriate screening and preventive services.  A health risk assessment was established today that included a review of current medications, allergies, social history, family history, medical and preventative health history, biometrics, and preventative screenings to identify potential safety concerns or impairments.  A personalized plan was printed today for your records and use.   Personalized health advice and education was given today to reduce health risks and promote self management and wellness.  Information regarding end of life planning was discussed today.  Conditions/risks identified: Edema, falls, still trying to recover from fracture this past year  Chronic problems discussed today: HTN, impaired glucose, vit D deficiency  Acute problems discussed today: Falls, need for PT, consult back with orthopedist  Recommendations:  I recommend a yearly ophthalmology/optometry visit for glaucoma screening and eye checkup  I recommended a yearly dental visit for hygiene and checkup  Advanced directives - discussed nature and purpose of Advanced Directives, encouraged them to complete them if they have not done so and/or encouraged them to get [place] a copy if they have done this already.  Advised updated bone density test.  Referrals  today: Consult back with orthopedist  Bone Density test  Immunizations: I recommended a yearly influenza vaccine, typically in September when the vaccine is usually available Is the Pneumococcal vaccine up to date: yes. Is the Shingles vaccine up to date: no.   Is the Td/Tdap vaccine up to date: yes.  [place] was seen today for med check plus.  Diagnoses and all  orders for this visit:  Essential hypertension -     Comprehensive metabolic panel -     CBC with Differential/Platelet -     Lipid panel -     Microalbumin / creatinine urine ratio -     EKG 12-Lead  Medicare annual wellness visit, subsequent  Impaired fasting glucose -     CBC with Differential/Platelet -     Hemoglobin A1c  Osteopenia, unspecified location -     CBC with Differential/Platelet -     VITAMIN D 25 Hydroxy (Vit-D Deficiency, Fractures) -     DG Bone Density; Future  Obesity with serious comorbidity, unspecified classification, unspecified obesity type -     Comprehensive metabolic panel -     Hemoglobin A1c  Vaccine counseling  Vitamin D deficiency -     VITAMIN D 25 Hydroxy (Vit-D Deficiency, Fractures) -     DG Bone Density; Future  History of fracture -     DG Bone Density; Future  History of fall -     DG Bone Density; Future  Edema, unspecified type  Other orders -     amLODipine (NORVASC) 5 MG tablet; Take 1 tablet (5 mg total) by mouth daily. -     bisoprolol-hydrochlorothiazide (ZIAC) 5-6.25 MG tablet; Take 1 tablet by mouth daily. -     alendronate (FOSAMAX) 70 MG tablet; TAKE 1 TABLET BY MOUTH ONCE A WEEK WITH A FULL GLASS OF WATER ON AN EMPTY STOMACH     Medicare Attestation A preventative services visit was completed today.  During the course of the visit the patient was educated and counseled about appropriate screening and preventive services.  A health risk assessment was established with the patient that included a review of current medications, allergies, social  history, family history, medical and preventative health history, biometrics, and preventative screenings to identify potential safety concerns or impairments.  A personalized plan was printed today for the patient's records and use.   Personalized health advice and education was given today to reduce health risks and promote self management and wellness.  Information regarding end of life planning was discussed today.  Dorothea Ogle, PA-C   01/12/2018

## 2018-01-13 ENCOUNTER — Other Ambulatory Visit: Payer: Self-pay | Admitting: Medical

## 2018-01-13 ENCOUNTER — Telehealth: Payer: Self-pay

## 2018-01-13 LAB — CBC WITH DIFFERENTIAL/PLATELET
Basophils Absolute: 0.1 10*3/uL (ref 0.0–0.2)
Basos: 0 %
EOS (ABSOLUTE): 0.2 10*3/uL (ref 0.0–0.4)
EOS: 1 %
HEMATOCRIT: 41.7 % (ref 37.5–51.0)
Hemoglobin: 14.4 g/dL (ref 13.0–17.7)
Immature Grans (Abs): 0 10*3/uL (ref 0.0–0.1)
Immature Granulocytes: 0 %
LYMPHS ABS: 2 10*3/uL (ref 0.7–3.1)
Lymphs: 16 %
MCH: 35.3 pg — ABNORMAL HIGH (ref 26.6–33.0)
MCHC: 34.5 g/dL (ref 31.5–35.7)
MCV: 102 fL — AB (ref 79–97)
MONOS ABS: 1.6 10*3/uL — AB (ref 0.1–0.9)
Monocytes: 13 %
Neutrophils Absolute: 8.6 10*3/uL — ABNORMAL HIGH (ref 1.4–7.0)
Neutrophils: 70 %
Platelets: 373 10*3/uL (ref 150–379)
RBC: 4.08 x10E6/uL — AB (ref 4.14–5.80)
RDW: 12 % — AB (ref 12.3–15.4)
WBC: 12.5 10*3/uL — AB (ref 3.4–10.8)

## 2018-01-13 LAB — LIPID PANEL
Chol/HDL Ratio: 2.5 ratio (ref 0.0–5.0)
Cholesterol, Total: 150 mg/dL (ref 100–199)
HDL: 61 mg/dL (ref >39)
LDL Calculated: 74 mg/dL (ref 0–99)
TRIGLYCERIDES: 74 mg/dL (ref 0–149)
VLDL Cholesterol Cal: 15 mg/dL (ref 5–40)

## 2018-01-13 LAB — MICROALBUMIN / CREATININE URINE RATIO
CREATININE, UR: 69.1 mg/dL
Microalb/Creat Ratio: 46.9 mg/g creat — ABNORMAL HIGH (ref 0.0–30.0)
Microalbumin, Urine: 32.4 ug/mL

## 2018-01-13 LAB — COMPREHENSIVE METABOLIC PANEL
A/G RATIO: 1.1 — AB (ref 1.2–2.2)
ALBUMIN: 3.8 g/dL (ref 3.5–4.7)
ALK PHOS: 74 IU/L (ref 39–117)
ALT: 14 IU/L (ref 0–44)
AST: 17 IU/L (ref 0–40)
BILIRUBIN TOTAL: 0.6 mg/dL (ref 0.0–1.2)
BUN / CREAT RATIO: 12 (ref 10–24)
BUN: 9 mg/dL (ref 8–27)
CO2: 28 mmol/L (ref 20–29)
CREATININE: 0.76 mg/dL (ref 0.76–1.27)
Calcium: 9.5 mg/dL (ref 8.6–10.2)
Chloride: 93 mmol/L — ABNORMAL LOW (ref 96–106)
GFR calc Af Amer: 98 mL/min/{1.73_m2} (ref >59)
GFR calc non Af Amer: 84 mL/min/{1.73_m2} (ref >59)
GLOBULIN, TOTAL: 3.6 g/dL (ref 1.5–4.5)
Glucose: 100 mg/dL — ABNORMAL HIGH (ref 65–99)
POTASSIUM: 3.8 mmol/L (ref 3.5–5.2)
SODIUM: 134 mmol/L (ref 134–144)
Total Protein: 7.4 g/dL (ref 6.0–8.5)

## 2018-01-13 LAB — HEMOGLOBIN A1C
ESTIMATED AVERAGE GLUCOSE: 111 mg/dL
Hgb A1c MFr Bld: 5.5 % (ref 4.8–5.6)

## 2018-01-13 LAB — VITAMIN D 25 HYDROXY (VIT D DEFICIENCY, FRACTURES): Vit D, 25-Hydroxy: 60.4 ng/mL (ref 30.0–100.0)

## 2018-01-13 NOTE — Telephone Encounter (Signed)
-----   Message from Carlena Hurl, PA-C sent at 01/13/2018  8:13 AM EDT ----- His white blood cell count was a little elevated for some reason.  Let us have him return in 1 month for nurse visit for repeat CBC  Have him schedule his bone density test.  The order is already in the system I recommend he check around the house to re-reduce risk of falls such as removing loose wires loose carpets, make sure areas are well lit I recommend he follow-up with orthopedist about therapy and rehabilitation as he is not yet back to where he should be since his fracture Continue current medications Check insurance coverage about the shingles vaccine He had a considerable amount of swelling in his legs yesterday.  This tells me he needs to move more or at least do physical therapy we can get to exercising more and elevate the legs when sitting to help with swelling

## 2018-01-13 NOTE — Telephone Encounter (Signed)
Called patient, LVM advising patient to call back to discuss results.

## 2018-02-15 ENCOUNTER — Telehealth: Payer: Self-pay | Admitting: Internal Medicine

## 2018-02-15 NOTE — Telephone Encounter (Signed)
Pt is scheduled for tomorrow for a lab visit but no future orders have been placed. Please advise

## 2018-02-16 ENCOUNTER — Other Ambulatory Visit: Payer: Self-pay | Admitting: Medical

## 2018-02-16 ENCOUNTER — Other Ambulatory Visit: Payer: Medicare Other

## 2018-02-16 DIAGNOSIS — D72829 Elevated white blood cell count, unspecified: Secondary | ICD-10-CM

## 2018-02-16 LAB — CBC WITH DIFFERENTIAL/PLATELET
BASOS ABS: 0.1 10*3/uL (ref 0.0–0.2)
BASOS: 1 %
EOS (ABSOLUTE): 0.1 10*3/uL (ref 0.0–0.4)
Eos: 1 %
Hematocrit: 40.9 % (ref 37.5–51.0)
Hemoglobin: 14.1 g/dL (ref 13.0–17.7)
IMMATURE GRANS (ABS): 0.1 10*3/uL (ref 0.0–0.1)
IMMATURE GRANULOCYTES: 1 %
LYMPHS: 17 %
Lymphocytes Absolute: 1.9 10*3/uL (ref 0.7–3.1)
MCH: 34.9 pg — ABNORMAL HIGH (ref 26.6–33.0)
MCHC: 34.5 g/dL (ref 31.5–35.7)
MCV: 101 fL — ABNORMAL HIGH (ref 79–97)
Monocytes Absolute: 1.3 10*3/uL — ABNORMAL HIGH (ref 0.1–0.9)
Monocytes: 11 %
Neutrophils Absolute: 7.7 10*3/uL — ABNORMAL HIGH (ref 1.4–7.0)
Neutrophils: 69 %
PLATELETS: 350 10*3/uL (ref 150–379)
RBC: 4.04 x10E6/uL — ABNORMAL LOW (ref 4.14–5.80)
RDW: 12.5 % (ref 12.3–15.4)
WBC: 11.1 10*3/uL — AB (ref 3.4–10.8)

## 2018-02-16 NOTE — Telephone Encounter (Signed)
done

## 2018-02-19 ENCOUNTER — Telehealth: Payer: Self-pay | Admitting: Medical

## 2018-02-19 NOTE — Telephone Encounter (Signed)
Pt called back and results were given. Pt scheduled an appt for Wednesday.

## 2018-02-24 ENCOUNTER — Encounter: Payer: Self-pay | Admitting: Medical

## 2018-02-24 ENCOUNTER — Ambulatory Visit (INDEPENDENT_AMBULATORY_CARE_PROVIDER_SITE_OTHER): Payer: Medicare Other | Admitting: Medical

## 2018-02-24 ENCOUNTER — Telehealth: Payer: Self-pay | Admitting: Medical

## 2018-02-24 VITALS — BP 146/52 | HR 60 | Temp 97.7°F | Ht 62.75 in | Wt 189.8 lb

## 2018-02-24 DIAGNOSIS — I10 Essential (primary) hypertension: Secondary | ICD-10-CM | POA: Diagnosis not present

## 2018-02-24 DIAGNOSIS — R609 Edema, unspecified: Secondary | ICD-10-CM | POA: Diagnosis not present

## 2018-02-24 DIAGNOSIS — D72829 Elevated white blood cell count, unspecified: Secondary | ICD-10-CM | POA: Diagnosis not present

## 2018-02-24 DIAGNOSIS — Z9181 History of falling: Secondary | ICD-10-CM | POA: Diagnosis not present

## 2018-02-24 DIAGNOSIS — Z8781 Personal history of (healed) traumatic fracture: Secondary | ICD-10-CM

## 2018-02-24 DIAGNOSIS — M8580 Other specified disorders of bone density and structure, unspecified site: Secondary | ICD-10-CM

## 2018-02-24 DIAGNOSIS — R2689 Other abnormalities of gait and mobility: Secondary | ICD-10-CM | POA: Diagnosis not present

## 2018-02-24 NOTE — Patient Instructions (Signed)
Encounter Diagnoses  Name Primary?  . Leukocytosis, unspecified type Yes  . Osteopenia, unspecified location   . History of fall   . Essential hypertension   . History of fracture   . Edema, unspecified type    recommendations:  Elevated white count  After our discussion today, we will plan to monitor the white blood count every 6 months  If the white cell count increases, then I will recommend you have a consult with hematology   Bone Density test - Call and schedule the bone density test at [facility]  [address], [place], [place] [address]  [address], [place] [address]  I will refer you back to physical therapy to help with mobility   I need you exercising at the [facility] to stay in shape and help with leg swelling  Follow up pending physical therapy

## 2018-02-24 NOTE — Telephone Encounter (Signed)
Refer to physical therapy for leg pain, falls, decreased mobility.

## 2018-02-24 NOTE — Progress Notes (Signed)
Subjective: Chief Complaint  Patient presents with  . Hypertension    follow up   Here at my request for f/u from last visit.   At his recent medicare well visit he had leg swelling, chronically elevated WBCs, and was not active like he had been in prior years.  He had a leg fracture (right) this past year, and still has decreased mobility.  Not exercising, uses walker at times.   Wants updated handicap placard    Denies fever, night sweats, weight loss, blood in stool or urine, no lymph nodes swollen.   No other aggravating or relieving factors. No other complaint.  Past Medical History:  Diagnosis Date  . Hypertension   . Low bone mass 03/2013   Bone Density scan  . Obesity   . Shingles 2005  . Wears glasses   . Wears partial dentures    Current Outpatient Medications on File Prior to Visit  Medication Sig Dispense Refill  . alendronate (FOSAMAX) 70 MG tablet TAKE 1 TABLET BY MOUTH ONCE A WEEK WITH A FULL GLASS OF WATER ON AN EMPTY STOMACH 12 tablet 3  . amLODipine (NORVASC) 5 MG tablet Take 1 tablet (5 mg total) by mouth daily. 90 tablet 3  . aspirin EC 325 MG tablet Take 1 tablet (325 mg total) by mouth daily. For 30 days post op for DVT Prophylaxis 30 tablet 0  . bisoprolol-hydrochlorothiazide (ZIAC) 5-6.25 MG tablet Take 1 tablet by mouth daily. 90 tablet 3  . Cholecalciferol (VITAMIN D) 1000 UNITS capsule Take 1,000 Units by mouth daily.     . fish oil-omega-3 fatty acids 1000 MG capsule Take 1 g by mouth daily.     . Multiple Vitamins-Minerals (CENTRUM SILVER ULTRA MENS) TABS Take 1 tablet by mouth daily.       No current facility-administered medications on file prior to visit.    ROS as in subjective  Objective: BP (!) 146/52   Pulse 60   Temp 97.7 F (36.5 C) (Oral)   Ht 5' 2.75" (1.594 m)   Wt 189 lb 12.8 oz (86.1 kg)   SpO2 98%   BMI 33.89 kg/m   BP Readings from Last 3 Encounters:  02/24/18 (!) 146/52  01/12/18 124/76  05/21/17 (!) 133/46   Wt Readings  from Last 3 Encounters:  02/24/18 189 lb 12.8 oz (86.1 kg)  01/12/18 194 lb (88 kg)  05/18/17 198 lb (89.8 kg)   Gen: wd, wn, nad Skin: unremarkable Heart rrr, normal s1, s2 , no murmurs Lungs clear Ext: 1+ mild nonpitting edema of bilat LE today improved from last visit Ambulating with walker    Assessment: Encounter Diagnoses  Name Primary?  . Leukocytosis, unspecified type Yes  . Osteopenia, unspecified location   . History of fall   . Essential hypertension   . History of fracture   . Edema, unspecified type   . Decreased mobility     Plan: Leukocytosis - in the 11,000 range WBCs for past 4 years stable, but in past year elevated.  Discussed possible causes.   He declines hematology referral.  We will monitor q28mo unless new symptoms.  Osteopenia - c/t bisphosphonate, needs to be going back to gym to get some weight bearing exercise like he was prior at the [facility], hx/o fracture, decreased mobility - referral to PT  HTN - c/t same medication  Edema - somewhat improved.  Discussed increasing activity, limit salt.     Patient Instructions   Encounter  Diagnoses  Name Primary?  . Leukocytosis, unspecified type Yes  . Osteopenia, unspecified location   . History of fall   . Essential hypertension   . History of fracture   . Edema, unspecified type    recommendations:  Elevated white count  After our discussion today, we will plan to monitor the white blood count every 6 months  If the white cell count increases, then I will recommend you have a consult with hematology   Bone Density test - Call and schedule the bone density test at [facility]  [address], [place], [place] [address]  [address], [place] [address]  I will refer you back to physical therapy to help with mobility   I need you exercising at the [facility] to stay in shape and help with leg swelling  Follow up pending  physical therapy

## 2018-03-08 NOTE — Telephone Encounter (Signed)
Done

## 2018-03-18 ENCOUNTER — Encounter: Payer: Self-pay | Admitting: Internal Medicine

## 2018-04-16 ENCOUNTER — Ambulatory Visit
Admission: RE | Admit: 2018-04-16 | Discharge: 2018-04-16 | Disposition: A | Discharge status: Home / Self Care | Payer: Medicare Other | Source: Ambulatory Visit | Attending: Medical | Admitting: Medical

## 2018-04-16 DIAGNOSIS — M85852 Other specified disorders of bone density and structure, left thigh: Secondary | ICD-10-CM | POA: Diagnosis not present

## 2018-04-16 DIAGNOSIS — E559 Vitamin D deficiency, unspecified: Secondary | ICD-10-CM

## 2018-04-16 DIAGNOSIS — M8580 Other specified disorders of bone density and structure, unspecified site: Secondary | ICD-10-CM

## 2018-04-16 DIAGNOSIS — Z9181 History of falling: Secondary | ICD-10-CM

## 2018-04-16 DIAGNOSIS — Z8781 Personal history of (healed) traumatic fracture: Secondary | ICD-10-CM

## 2018-08-16 DIAGNOSIS — Z23 Encounter for immunization: Secondary | ICD-10-CM | POA: Diagnosis not present

## 2018-10-10 IMAGING — CR DG SHOULDER 2+V*R*
3 series · 3 of 3 positions shown · non-contrast
Comparison: Chest x-ray dated March 21, 2013 which included a portion
of the right shoulder.

CLINICAL DATA: Status post all yesterday. Persistent anterior
shoulder pain.

EXAM:
RIGHT SHOULDER - 2+ VIEW

[x shoulder ap right (1 of 3)]
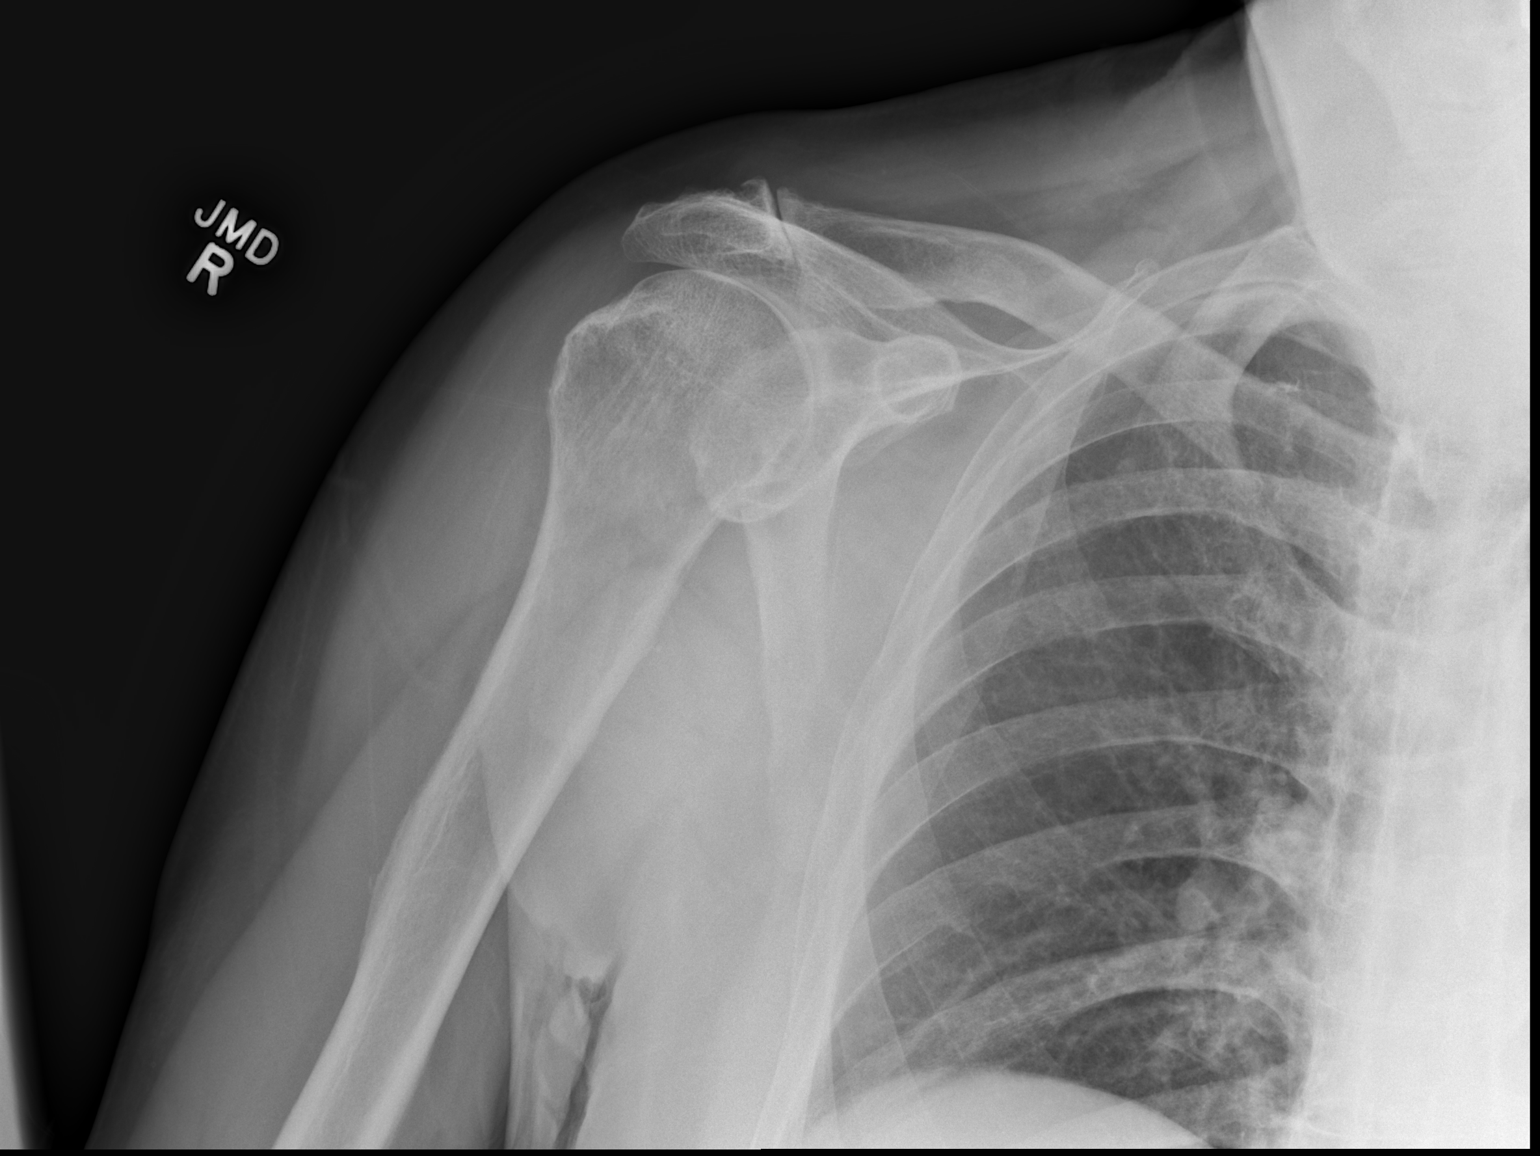

[x shoulder ap right (2 of 3)]
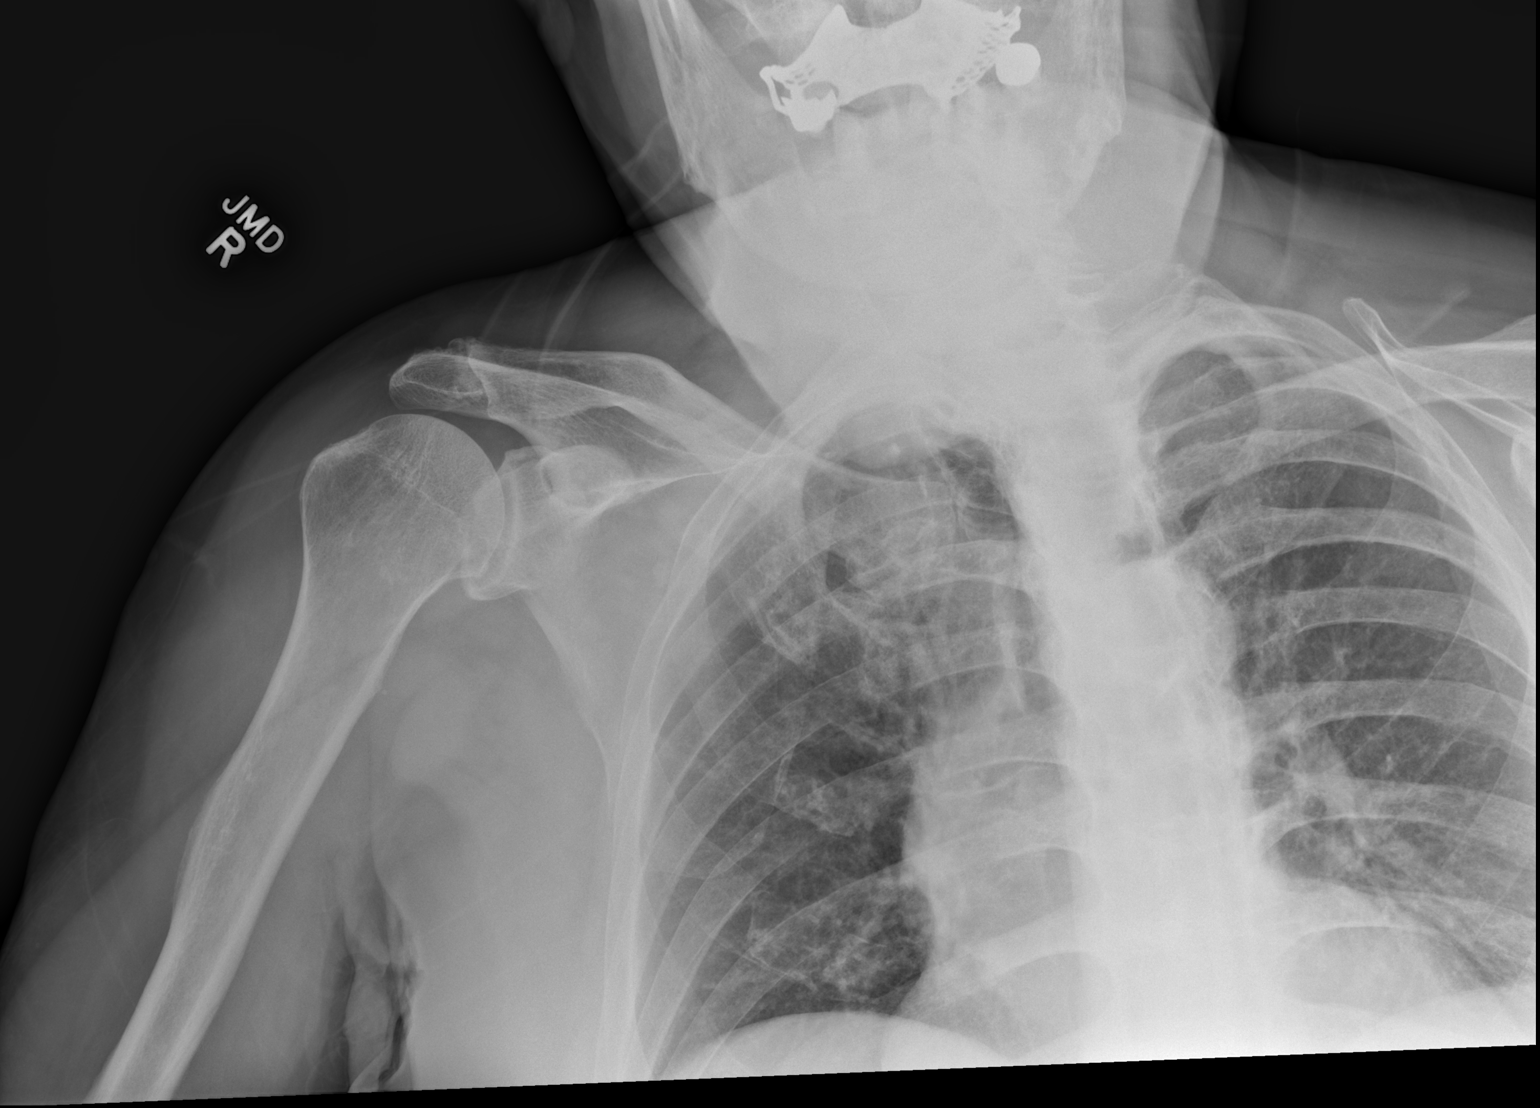

[x shoulder ap right (3 of 3)]
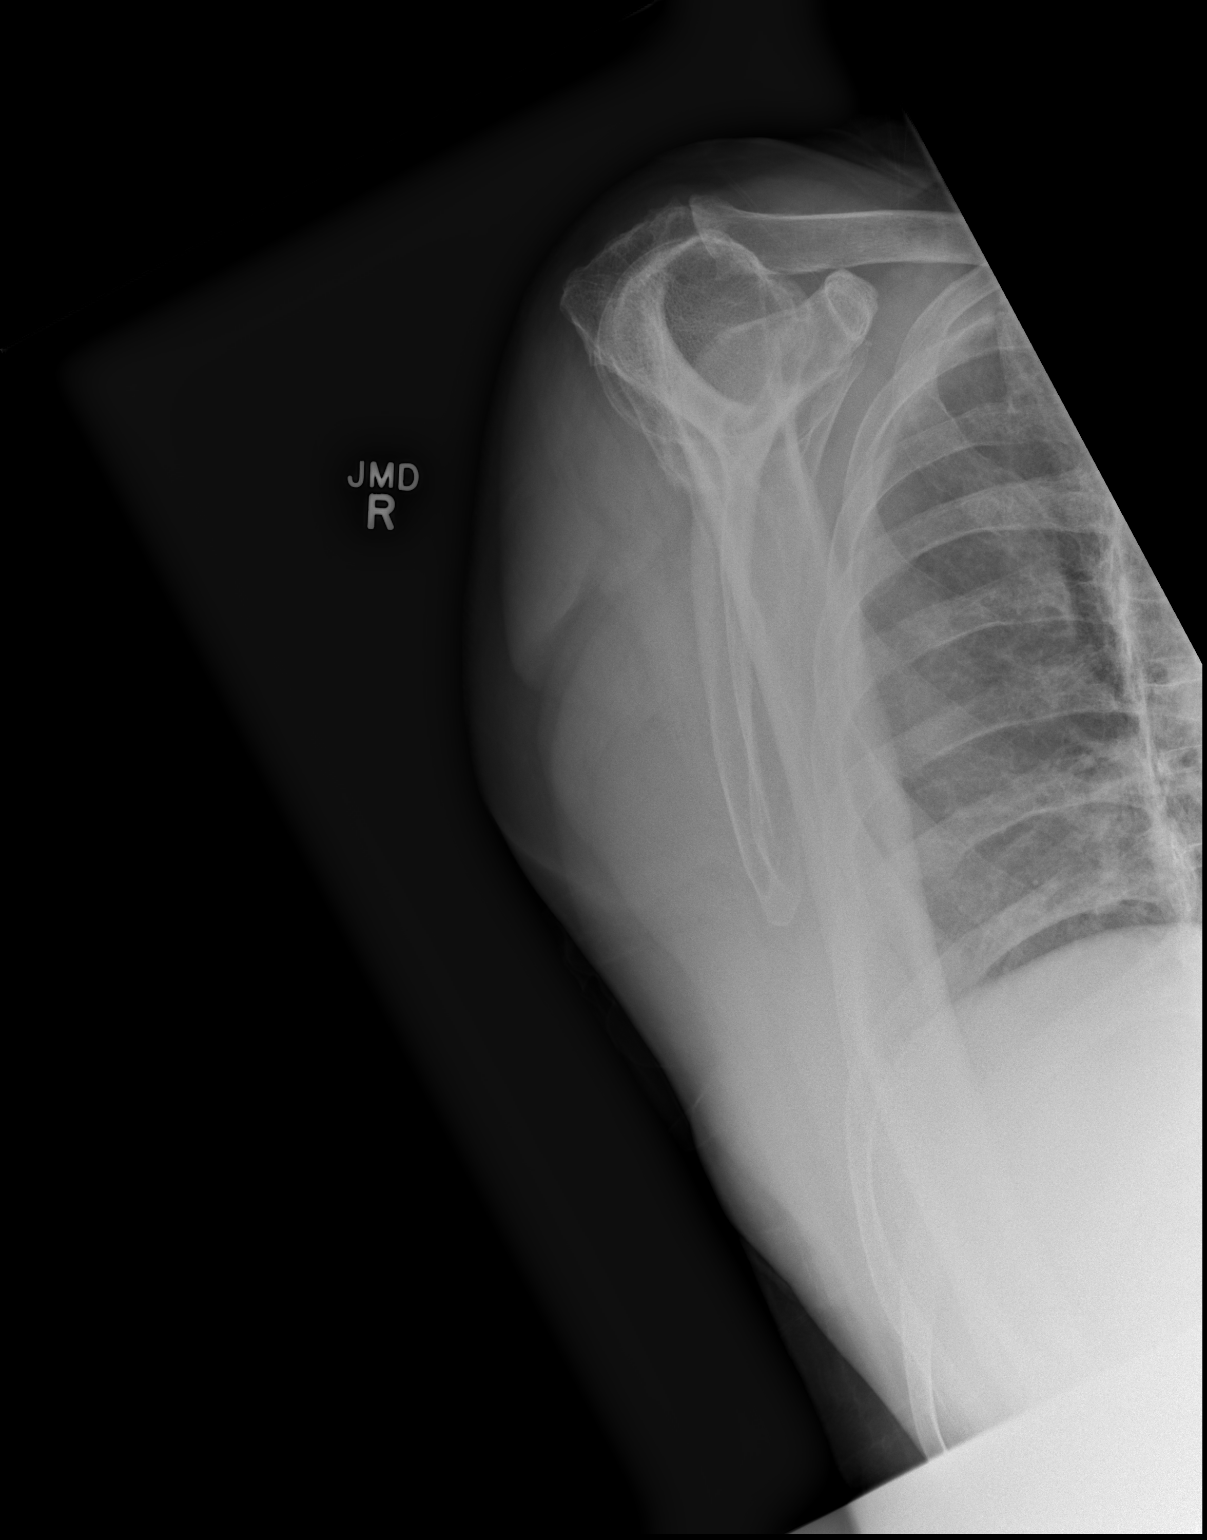

[3 of 3 positions shown; findings below may reference images not displayed]

FINDINGS: The bones are subjectively adequately mineralized. There is moderate
narrowing of the glenohumeral and AC joint spaces. There is mild
narrowing of the subacromial subdeltoid space. No acute fracture is
observed. The observed portions of the right clavicle and upper
right ribs are normal.
IMPRESSION: No acute fracture nor dislocation of the right shoulder is observed.
There is moderate osteoarthritic change of the glenohumeral and AC
joints.

## 2018-12-28 ENCOUNTER — Other Ambulatory Visit: Payer: Self-pay | Admitting: Medical

## 2019-01-06 ENCOUNTER — Other Ambulatory Visit: Payer: Self-pay | Admitting: Medical

## 2019-01-13 ENCOUNTER — Ambulatory Visit: Payer: Medicare Other | Admitting: Medical

## 2019-01-14 ENCOUNTER — Ambulatory Visit: Payer: BLUE CROSS/BLUE SHIELD | Admitting: Medical

## 2019-01-25 DIAGNOSIS — J310 Chronic rhinitis: Secondary | ICD-10-CM | POA: Diagnosis not present

## 2019-01-25 DIAGNOSIS — H6123 Impacted cerumen, bilateral: Secondary | ICD-10-CM | POA: Diagnosis not present

## 2019-01-25 DIAGNOSIS — J300 Vasomotor rhinitis: Secondary | ICD-10-CM | POA: Diagnosis not present

## 2019-03-23 ENCOUNTER — Other Ambulatory Visit: Payer: Self-pay | Admitting: Medical

## 2019-03-25 ENCOUNTER — Other Ambulatory Visit: Payer: Self-pay | Admitting: Medical

## 2019-05-23 ENCOUNTER — Other Ambulatory Visit: Payer: Self-pay

## 2019-06-18 ENCOUNTER — Other Ambulatory Visit: Payer: Self-pay | Admitting: Medical

## 2019-06-19 ENCOUNTER — Other Ambulatory Visit: Payer: Self-pay | Admitting: Medical

## 2019-06-22 ENCOUNTER — Encounter: Payer: Self-pay | Admitting: Medical

## 2019-07-15 ENCOUNTER — Other Ambulatory Visit: Payer: Self-pay | Admitting: Medical

## 2019-07-15 ENCOUNTER — Telehealth: Payer: Self-pay

## 2019-07-15 NOTE — Telephone Encounter (Signed)
Called pt to advise of appointment needed. At the least a med check will be needed. Med will be sent in for 30 days . [place]

## 2019-08-07 ENCOUNTER — Other Ambulatory Visit: Payer: Self-pay | Admitting: Medical

## 2019-08-09 DIAGNOSIS — Z23 Encounter for immunization: Secondary | ICD-10-CM | POA: Diagnosis not present

## 2019-08-31 ENCOUNTER — Other Ambulatory Visit: Payer: Self-pay | Admitting: Medical

## 2019-09-23 ENCOUNTER — Other Ambulatory Visit: Payer: Self-pay | Admitting: Medical

## 2019-09-23 NOTE — Telephone Encounter (Signed)
We have informed patient several times of need of an appointment but nothing has been scheduled but refills are requested again. Is this appropriate?

## 2019-10-22 ENCOUNTER — Other Ambulatory Visit: Payer: Self-pay | Admitting: Medical

## 2019-11-16 ENCOUNTER — Other Ambulatory Visit: Payer: Self-pay | Admitting: Medical

## 2019-11-16 NOTE — Telephone Encounter (Signed)
Patient still has not scheduled appointment

## 2021-09-25 ENCOUNTER — Inpatient Hospital Stay (HOSPITAL_COMMUNITY): Payer: Medicare (Managed Care)

## 2021-09-25 ENCOUNTER — Emergency Department (HOSPITAL_COMMUNITY): Payer: Medicare (Managed Care)

## 2021-09-25 ENCOUNTER — Other Ambulatory Visit: Payer: Self-pay

## 2021-09-25 ENCOUNTER — Encounter (HOSPITAL_COMMUNITY): Payer: Self-pay | Admitting: Emergency Medicine

## 2021-09-25 ENCOUNTER — Inpatient Hospital Stay (HOSPITAL_COMMUNITY)
Admission: EM | Admit: 2021-09-25 | Discharge: 2021-10-08 | DRG: 871 | Disposition: A | Discharge status: Hospice | Payer: Medicare (Managed Care) | Attending: Family Medicine | Admitting: Family Medicine

## 2021-09-25 DIAGNOSIS — C61 Malignant neoplasm of prostate: Secondary | ICD-10-CM | POA: Diagnosis present

## 2021-09-25 DIAGNOSIS — Z7982 Long term (current) use of aspirin: Secondary | ICD-10-CM

## 2021-09-25 DIAGNOSIS — I959 Hypotension, unspecified: Secondary | ICD-10-CM | POA: Diagnosis present

## 2021-09-25 DIAGNOSIS — K922 Gastrointestinal hemorrhage, unspecified: Secondary | ICD-10-CM | POA: Diagnosis not present

## 2021-09-25 DIAGNOSIS — N289 Disorder of kidney and ureter, unspecified: Secondary | ICD-10-CM

## 2021-09-25 DIAGNOSIS — I2510 Atherosclerotic heart disease of native coronary artery without angina pectoris: Secondary | ICD-10-CM | POA: Diagnosis present

## 2021-09-25 DIAGNOSIS — N132 Hydronephrosis with renal and ureteral calculous obstruction: Secondary | ICD-10-CM | POA: Diagnosis present

## 2021-09-25 DIAGNOSIS — R310 Gross hematuria: Secondary | ICD-10-CM | POA: Diagnosis not present

## 2021-09-25 DIAGNOSIS — L0390 Cellulitis, unspecified: Secondary | ICD-10-CM

## 2021-09-25 DIAGNOSIS — N320 Bladder-neck obstruction: Secondary | ICD-10-CM | POA: Diagnosis present

## 2021-09-25 DIAGNOSIS — Z20822 Contact with and (suspected) exposure to covid-19: Secondary | ICD-10-CM | POA: Diagnosis present

## 2021-09-25 DIAGNOSIS — I10 Essential (primary) hypertension: Secondary | ICD-10-CM | POA: Diagnosis present

## 2021-09-25 DIAGNOSIS — D696 Thrombocytopenia, unspecified: Secondary | ICD-10-CM | POA: Diagnosis present

## 2021-09-25 DIAGNOSIS — R451 Restlessness and agitation: Secondary | ICD-10-CM | POA: Diagnosis not present

## 2021-09-25 DIAGNOSIS — Z66 Do not resuscitate: Secondary | ICD-10-CM | POA: Diagnosis not present

## 2021-09-25 DIAGNOSIS — K921 Melena: Secondary | ICD-10-CM | POA: Diagnosis not present

## 2021-09-25 DIAGNOSIS — R0600 Dyspnea, unspecified: Secondary | ICD-10-CM

## 2021-09-25 DIAGNOSIS — I420 Dilated cardiomyopathy: Secondary | ICD-10-CM | POA: Diagnosis not present

## 2021-09-25 DIAGNOSIS — A419 Sepsis, unspecified organism: Secondary | ICD-10-CM | POA: Diagnosis present

## 2021-09-25 DIAGNOSIS — E871 Hypo-osmolality and hyponatremia: Secondary | ICD-10-CM | POA: Diagnosis present

## 2021-09-25 DIAGNOSIS — N138 Other obstructive and reflux uropathy: Secondary | ICD-10-CM | POA: Diagnosis present

## 2021-09-25 DIAGNOSIS — Z7189 Other specified counseling: Secondary | ICD-10-CM | POA: Diagnosis not present

## 2021-09-25 DIAGNOSIS — I5023 Acute on chronic systolic (congestive) heart failure: Secondary | ICD-10-CM | POA: Diagnosis present

## 2021-09-25 DIAGNOSIS — F1010 Alcohol abuse, uncomplicated: Secondary | ICD-10-CM | POA: Diagnosis present

## 2021-09-25 DIAGNOSIS — Z7983 Long term (current) use of bisphosphonates: Secondary | ICD-10-CM

## 2021-09-25 DIAGNOSIS — I110 Hypertensive heart disease with heart failure: Secondary | ICD-10-CM | POA: Diagnosis present

## 2021-09-25 DIAGNOSIS — Z515 Encounter for palliative care: Secondary | ICD-10-CM

## 2021-09-25 DIAGNOSIS — C7951 Secondary malignant neoplasm of bone: Secondary | ICD-10-CM | POA: Diagnosis present

## 2021-09-25 DIAGNOSIS — E876 Hypokalemia: Secondary | ICD-10-CM | POA: Diagnosis present

## 2021-09-25 DIAGNOSIS — D539 Nutritional anemia, unspecified: Secondary | ICD-10-CM | POA: Diagnosis present

## 2021-09-25 DIAGNOSIS — Z87891 Personal history of nicotine dependence: Secondary | ICD-10-CM

## 2021-09-25 DIAGNOSIS — R7989 Other specified abnormal findings of blood chemistry: Secondary | ICD-10-CM | POA: Diagnosis present

## 2021-09-25 DIAGNOSIS — J9811 Atelectasis: Secondary | ICD-10-CM | POA: Diagnosis present

## 2021-09-25 DIAGNOSIS — N179 Acute kidney failure, unspecified: Secondary | ICD-10-CM | POA: Diagnosis present

## 2021-09-25 DIAGNOSIS — R32 Unspecified urinary incontinence: Secondary | ICD-10-CM | POA: Diagnosis present

## 2021-09-25 DIAGNOSIS — I4891 Unspecified atrial fibrillation: Secondary | ICD-10-CM | POA: Diagnosis not present

## 2021-09-25 DIAGNOSIS — K761 Chronic passive congestion of liver: Secondary | ICD-10-CM | POA: Diagnosis present

## 2021-09-25 DIAGNOSIS — I480 Paroxysmal atrial fibrillation: Secondary | ICD-10-CM | POA: Diagnosis present

## 2021-09-25 DIAGNOSIS — N401 Enlarged prostate with lower urinary tract symptoms: Secondary | ICD-10-CM | POA: Diagnosis present

## 2021-09-25 DIAGNOSIS — J90 Pleural effusion, not elsewhere classified: Secondary | ICD-10-CM | POA: Diagnosis not present

## 2021-09-25 DIAGNOSIS — E872 Acidosis, unspecified: Secondary | ICD-10-CM | POA: Diagnosis present

## 2021-09-25 DIAGNOSIS — Q782 Osteopetrosis: Secondary | ICD-10-CM | POA: Diagnosis not present

## 2021-09-25 DIAGNOSIS — R54 Age-related physical debility: Secondary | ICD-10-CM | POA: Diagnosis present

## 2021-09-25 DIAGNOSIS — I5021 Acute systolic (congestive) heart failure: Secondary | ICD-10-CM | POA: Diagnosis not present

## 2021-09-25 DIAGNOSIS — R64 Cachexia: Secondary | ICD-10-CM | POA: Diagnosis present

## 2021-09-25 DIAGNOSIS — R5381 Other malaise: Secondary | ICD-10-CM | POA: Diagnosis not present

## 2021-09-25 DIAGNOSIS — R6520 Severe sepsis without septic shock: Secondary | ICD-10-CM | POA: Diagnosis present

## 2021-09-25 DIAGNOSIS — R000 Tachycardia, unspecified: Secondary | ICD-10-CM | POA: Diagnosis not present

## 2021-09-25 DIAGNOSIS — L03115 Cellulitis of right lower limb: Secondary | ICD-10-CM | POA: Diagnosis present

## 2021-09-25 DIAGNOSIS — D72829 Elevated white blood cell count, unspecified: Secondary | ICD-10-CM

## 2021-09-25 DIAGNOSIS — D638 Anemia in other chronic diseases classified elsewhere: Secondary | ICD-10-CM | POA: Diagnosis present

## 2021-09-25 DIAGNOSIS — H04123 Dry eye syndrome of bilateral lacrimal glands: Secondary | ICD-10-CM | POA: Diagnosis present

## 2021-09-25 DIAGNOSIS — Z79899 Other long term (current) drug therapy: Secondary | ICD-10-CM

## 2021-09-25 LAB — SODIUM, URINE, RANDOM: Sodium, Ur: <10 mmol/L

## 2021-09-25 LAB — CBC WITH DIFFERENTIAL/PLATELET
Abs Immature Granulocytes: 0.19 10*3/uL — ABNORMAL HIGH (ref 0.00–0.07)
Basophils Absolute: 0 10*3/uL (ref 0.0–0.1)
Basophils Relative: 0 %
Eosinophils Absolute: 0 10*3/uL (ref 0.0–0.5)
Eosinophils Relative: 0 %
HCT: 32.7 % — ABNORMAL LOW (ref 39.0–52.0)
Hemoglobin: 10.6 g/dL — ABNORMAL LOW (ref 13.0–17.0)
Immature Granulocytes: 1 %
Lymphocytes Relative: 2 %
Lymphs Abs: 0.4 10*3/uL — ABNORMAL LOW (ref 0.7–4.0)
MCH: 34.4 pg — ABNORMAL HIGH (ref 26.0–34.0)
MCHC: 32.4 g/dL (ref 30.0–36.0)
MCV: 106.2 fL — ABNORMAL HIGH (ref 80.0–100.0)
Monocytes Absolute: 1.7 10*3/uL — ABNORMAL HIGH (ref 0.1–1.0)
Monocytes Relative: 7 %
Neutro Abs: 20.3 10*3/uL — ABNORMAL HIGH (ref 1.7–7.7)
Neutrophils Relative %: 90 %
Platelets: 180 10*3/uL (ref 150–400)
RBC: 3.08 MIL/uL — ABNORMAL LOW (ref 4.22–5.81)
RDW: 14.6 % (ref 11.5–15.5)
WBC: 22.6 10*3/uL — ABNORMAL HIGH (ref 4.0–10.5)
nRBC: 0 % (ref 0.0–0.2)

## 2021-09-25 LAB — COMPREHENSIVE METABOLIC PANEL
ALT: 157 U/L — ABNORMAL HIGH (ref 0–44)
AST: 71 U/L — ABNORMAL HIGH (ref 15–41)
Albumin: 3.3 g/dL — ABNORMAL LOW (ref 3.5–5.0)
Alkaline Phosphatase: 199 U/L — ABNORMAL HIGH (ref 38–126)
Anion gap: 17 — ABNORMAL HIGH (ref 5–15)
BUN: 115 mg/dL — ABNORMAL HIGH (ref 8–23)
CO2: 17 mmol/L — ABNORMAL LOW (ref 22–32)
Calcium: 8.4 mg/dL — ABNORMAL LOW (ref 8.9–10.3)
Chloride: 100 mmol/L (ref 98–111)
Creatinine, Ser: 5.18 mg/dL — ABNORMAL HIGH (ref 0.61–1.24)
GFR, Estimated: 10 mL/min — ABNORMAL LOW (ref >60)
Glucose, Bld: 135 mg/dL — ABNORMAL HIGH (ref 70–99)
Potassium: 5 mmol/L (ref 3.5–5.1)
Sodium: 134 mmol/L — ABNORMAL LOW (ref 135–145)
Total Bilirubin: 1.3 mg/dL — ABNORMAL HIGH (ref 0.3–1.2)
Total Protein: 7.1 g/dL (ref 6.5–8.1)

## 2021-09-25 LAB — CBG MONITORING, ED: Glucose-Capillary: 126 mg/dL — ABNORMAL HIGH (ref 70–99)

## 2021-09-25 LAB — URINALYSIS, ROUTINE W REFLEX MICROSCOPIC
Bilirubin Urine: NEGATIVE
Glucose, UA: NEGATIVE mg/dL
Hgb urine dipstick: NEGATIVE
Ketones, ur: NEGATIVE mg/dL
Nitrite: NEGATIVE
Protein, ur: NEGATIVE mg/dL
Specific Gravity, Urine: 1.015 (ref 1.005–1.030)
pH: 5.5 (ref 5.0–8.0)

## 2021-09-25 LAB — RESP PANEL BY RT-PCR (FLU A&B, COVID) ARPGX2
Influenza A by PCR: NEGATIVE
Influenza B by PCR: NEGATIVE
SARS Coronavirus 2 by RT PCR: NEGATIVE

## 2021-09-25 LAB — TYPE AND SCREEN
ABO/RH(D): O POS
Antibody Screen: NEGATIVE

## 2021-09-25 LAB — PROTIME-INR
INR: 2.4 — ABNORMAL HIGH (ref 0.8–1.2)
Prothrombin Time: 25.8 seconds — ABNORMAL HIGH (ref 11.4–15.2)

## 2021-09-25 LAB — CREATININE, URINE, RANDOM: Creatinine, Urine: 78.96 mg/dL

## 2021-09-25 LAB — BRAIN NATRIURETIC PEPTIDE: B Natriuretic Peptide: 3827.9 pg/mL — ABNORMAL HIGH (ref 0.0–100.0)

## 2021-09-25 LAB — ETHANOL: Alcohol, Ethyl (B): <10 mg/dL (ref <10)

## 2021-09-25 LAB — LACTIC ACID, PLASMA: Lactic Acid, Venous: 1.5 mmol/L (ref 0.5–1.9)

## 2021-09-25 MED ORDER — FUROSEMIDE 10 MG/ML IJ SOLN
40.0000 mg | Freq: Two times a day (BID) | INTRAMUSCULAR | Status: DC
  Administered 2021-09-25 – 2021-10-03 (×15): 40 mg via INTRAVENOUS
  Filled 2021-09-25 (×16): qty 4

## 2021-09-25 MED ORDER — SODIUM CHLORIDE 0.9% FLUSH
3.0000 mL | Freq: Two times a day (BID) | INTRAVENOUS | Status: DC
  Administered 2021-09-26 – 2021-10-08 (×24): 3 mL via INTRAVENOUS

## 2021-09-25 MED ORDER — SENNOSIDES-DOCUSATE SODIUM 8.6-50 MG PO TABS: 1.0000 | ORAL_TABLET | Freq: Every evening | ORAL | Status: DC | PRN

## 2021-09-25 MED ORDER — ONDANSETRON HCL 4 MG PO TABS: 4.0000 mg | ORAL_TABLET | Freq: Four times a day (QID) | ORAL | Status: DC | PRN

## 2021-09-25 MED ORDER — ACETAMINOPHEN 325 MG PO TABS: 650.0000 mg | ORAL_TABLET | Freq: Four times a day (QID) | ORAL | Status: DC | PRN

## 2021-09-25 MED ORDER — HEPARIN SODIUM (PORCINE) 5000 UNIT/ML IJ SOLN
5000.0000 [IU] | Freq: Three times a day (TID) | INTRAMUSCULAR | Status: DC
  Administered 2021-09-25 – 2021-10-01 (×17): 5000 [IU] via SUBCUTANEOUS
  Filled 2021-09-25 (×17): qty 1

## 2021-09-25 MED ORDER — ACETAMINOPHEN 650 MG RE SUPP: 650.0000 mg | Freq: Four times a day (QID) | RECTAL | Status: DC | PRN

## 2021-09-25 MED ORDER — VANCOMYCIN HCL 1500 MG/300ML IV SOLN
1500.0000 mg | Freq: Once | INTRAVENOUS | Status: AC
  Administered 2021-09-25: 1500 mg via INTRAVENOUS
  Filled 2021-09-25: qty 300

## 2021-09-25 MED ORDER — SODIUM CHLORIDE 0.9 % IV BOLUS: 1000.0000 mL | Freq: Once | INTRAVENOUS | Status: DC

## 2021-09-25 MED ORDER — SODIUM CHLORIDE 0.9 % IV BOLUS
500.0000 mL | Freq: Once | INTRAVENOUS | Status: AC
  Administered 2021-09-25: 500 mL via INTRAVENOUS

## 2021-09-25 MED ORDER — SODIUM CHLORIDE 0.9 % IV SOLN
2.0000 g | Freq: Once | INTRAVENOUS | Status: AC
  Administered 2021-09-25: 2 g via INTRAVENOUS
  Filled 2021-09-25: qty 2

## 2021-09-25 MED ORDER — ONDANSETRON HCL 4 MG/2ML IJ SOLN: 4.0000 mg | Freq: Four times a day (QID) | INTRAMUSCULAR | Status: DC | PRN

## 2021-09-25 MED ORDER — VANCOMYCIN VARIABLE DOSE PER UNSTABLE RENAL FUNCTION (PHARMACIST DOSING)
Status: DC
Start: 2021-09-25 — End: 2021-09-27

## 2021-09-25 MED ORDER — SODIUM CHLORIDE 0.9 % IV SOLN
1.0000 g | INTRAVENOUS | Status: DC
  Administered 2021-09-26: 1 g via INTRAVENOUS
  Filled 2021-09-25 (×2): qty 1

## 2021-09-25 MED ORDER — LIDOCAINE HCL URETHRAL/MUCOSAL 2 % EX GEL
1.0000 "application " | Freq: Once | CUTANEOUS | Status: AC
  Administered 2021-09-26: 1 via URETHRAL
  Filled 2021-09-25: qty 11

## 2021-09-25 NOTE — ED Notes (Addendum)
Wrapped RLE with abd pads and rolled gauze to absorb some of the significant serous drainage seeping from his lower leg. Applied dressings to wounds present on arrival including buttocks, sacrum, right dorsal foot surface, and right great toe. Also applied moisture barrier cream to buttocks and perirectal area

## 2021-09-25 NOTE — ED Notes (Signed)
Bladder scan showed 36mL. Pt is was able to urinate in primo fit.

## 2021-09-25 NOTE — H&P (Addendum)
History and Physical    Daniel [place]en DOB: 18-May-1932 DOA: 09/25/2021  PCP: Carlena Hurl, PA-C  Patient coming from: Home via EMS  I have personally briefly reviewed patient's old medical records in [place]  Chief Complaint: Right lower extremity infection  HPI: Daniel Andersen is a 87 y.o. male with medical history significant for hypertension and alcohol use who presented to the ED for evaluation of right lower extremity infection.  History is supplemented by patient's daughter at bedside.  Patient came in for evaluation of right lower extremity skin infection.  Initially noticed swelling about 2 weeks ago around the time of Thanksgiving.  Has had progressive erythema to the right leg since that time.  He lives alone and today when his daughter went to see him and noticed a new large blister at the right great toe. Patient states that he is having any pain in the involved area.  Patient states that he is not currently taking any medications or receiving any routine medical care.  He states that he wears depends for urinary incontinence.  He denies any dysuria.  He denies any chest pain, dyspnea, cough, nausea vomiting, abdominal pain, diarrhea.  ED Course:  Initial vitals showed BP 137/73, pulse 99, RR 26, temp 96.3 F, SPO2 96% on room air.  Labs showed BUN 115, creatinine 5.18 (previously 0.76 March 2019), sodium 134, potassium 5.0, bicarb 17, serum glucose 135, AST 71, ALT 157, alk phos 199, total bilirubin 1.3, WBC 22.6, hemoglobin 10.6, platelets 180,000, lactic acid 1.5.  SARS-CoV-2 and influenza PCR negative.  Blood cultures collected and pending.  Right foot x-ray negative for acute osseous abnormality.  Right tibia, fibula x-ray negative for acute osseous abnormality.  Portable chest x-ray shows cardiomegaly with vascular congestion, pulmonary edema, and bilateral pleural effusions.  Multifocal osteosclerosis.  Suspicious for metastatic disease.   Changes suspicious for tracheal deviation to the right, left paratracheal mass cannot be excluded per radiology read.  CT chest without contrast shows changes consistent with bony metastatic disease, likely related to prostate carcinoma per radiology read.  Bilateral pleural effusions with lower lobe atelectasis present.  No focal mass lesion is noted.  Fullness of the collecting systems of the kidneys proximal laterally seen, which may be related to bladder outlet obstruction but incompletely evaluated on CT chest.  Patient was given IV vancomycin and cefepime and ordered to receive 500 cc normal saline.  Hospitalist service was consulted to admit for further evaluation and management.  Review of Systems: All systems reviewed and are negative except as documented in history of present illness above.   Past Medical History:  Diagnosis Date   Hypertension    Low bone mass 03/2013   Bone Density scan   Obesity    Shingles 2005   Wears glasses    Wears partial dentures     Past Surgical History:  Procedure Laterality Date   COLONOSCOPY W/ BIOPSIES AND POLYPECTOMY  2010   Benign polyps, repeat in 5 years   INGUINAL HERNIA REPAIR Right 11/2004   QUADRICEPS TENDON REPAIR Right 05/19/2017   Procedure: REPAIR QUADRICEP TENDON RIGHT;  Surgeon: Renette Butters, MD;  Location: [facility];  Service: Orthopedics;  Laterality: Right;   TONSILLECTOMY      Social History:  reports that he has quit smoking. His smoking use included cigarettes. He has a 10.00 pack-year smoking history. He has never used smokeless tobacco. He reports current alcohol use of about 12.0 standard drinks per  week. He reports that he does not use drugs.  No Known Allergies  History reviewed. No pertinent family history.   Prior to Admission medications   Medication Sig Start Date End Date Taking? Authorizing Provider  alendronate (FOSAMAX) 70 MG tablet TAKE 1 TABLET BY MOUTH ONCE A WEEK WITH A FULL GLASS OF WATER ON AN  EMPTY STOMACH 01/12/18   Tysinger, Camelia Eng, PA-C  amLODipine (NORVASC) 5 MG tablet TAKE 1 TABLET BY MOUTH EVERY DAY 10/24/19   Tysinger, Camelia Eng, PA-C  aspirin EC 325 MG tablet Take 1 tablet (325 mg total) by mouth daily. For 30 days post op for DVT Prophylaxis 05/19/17   Prudencio Burly III, PA-C  bisoprolol-hydrochlorothiazide [facility]) 5-6.25 MG tablet TAKE 1 TABLET BY MOUTH EVERY DAY 10/24/19   Tysinger, Camelia Eng, PA-C  Cholecalciferol (VITAMIN D) 1000 UNITS capsule Take 1,000 Units by mouth daily.     [provider]  fish oil-omega-3 fatty acids 1000 MG capsule Take 1 g by mouth daily.     [provider]  Multiple Vitamins-Minerals (CENTRUM SILVER ULTRA MENS) TABS Take 1 tablet by mouth daily.      [provider]    Physical Exam: Vitals:   09/25/21 1756 09/25/21 1800 09/25/21 1838 09/25/21 1900  BP: 137/76 137/73  134/73  Pulse: 99 100  (!) 102  Resp: (!) 24 (!) 26  (!) 22  Temp: (!) 96.3 F (35.7 C)  (!) 96.7 F (35.9 C)   TempSrc: Axillary  Rectal   SpO2: 99% 96%  97%  Weight: 77.1 kg     Height: '5\' 8"'  (1.727 m)      Constitutional: Elderly man resting in bed, NAD, calm, comfortable Eyes: PERRL, lids and conjunctivae normal ENMT: Mucous membranes are dry. Posterior pharynx clear of any exudate or lesions.poor dentition.  Neck: normal, supple, no masses. Respiratory: Diminished breath sound bilateral lung base otherwise clear to auscultation. Normal respiratory effort. No accessory muscle use.  Cardiovascular: Regular rate and rhythm, no murmurs / rubs / gallops.  +3 bilateral lower extremity edema.  Difficult to palpate pedal pulses due to edema. Abdomen: no tenderness, no masses palpated. Bowel sounds positive. GU: External catheter in place, small amount of urine present in collecting bin. Musculoskeletal: no clubbing / cyanosis. No joint deformity upper and lower extremities. Good ROM, no contractures. Normal muscle tone.  Skin: Right lower  extremity erythema from right foot extending proximally about 2/3 up to the knee.  Large intact bullae at the dorsal aspect of base of first toe as pictured below. Neurologic: CN 2-12 grossly intact. Sensation intact. Strength 5/5 in all 4.  Psychiatric: Alert and oriented x 3. Normal mood.        Labs on Admission: I have personally reviewed following labs and imaging studies  CBC: Recent Labs  Lab 09/25/21 1835  WBC 22.6*  NEUTROABS 20.3*  HGB 10.6*  HCT 32.7*  MCV 106.2*  PLT 937   Basic Metabolic Panel: Recent Labs  Lab 09/25/21 1835  NA 134*  K 5.0  CL 100  CO2 17*  GLUCOSE 135*  BUN 115*  CREATININE 5.18*  CALCIUM 8.4*   GFR: Estimated Creatinine Clearance: 9.7 mL/min (A) (by C-G formula based on SCr of 5.18 mg/dL (H)). Liver Function Tests: Recent Labs  Lab 09/25/21 1835  AST 71*  ALT 157*  ALKPHOS 199*  BILITOT 1.3*  PROT 7.1  ALBUMIN 3.3*   No results for input(s): LIPASE, AMYLASE in the last 168 hours.  No results for input(s): AMMONIA in the last 168 hours. Coagulation Profile: Recent Labs  Lab 09/25/21 1835  INR 2.4*   Cardiac Enzymes: No results for input(s): CKTOTAL, CKMB, CKMBINDEX, TROPONINI in the last 168 hours. BNP (last 3 results) No results for input(s): PROBNP in the last 8760 hours. HbA1C: No results for input(s): HGBA1C in the last 72 hours. CBG: Recent Labs  Lab 09/25/21 1815  GLUCAP 126*   Lipid Profile: No results for input(s): CHOL, HDL, LDLCALC, TRIG, CHOLHDL, LDLDIRECT in the last 72 hours. Thyroid Function Tests: No results for input(s): TSH, T4TOTAL, FREET4, T3FREE, THYROIDAB in the last 72 hours. Anemia Panel: No results for input(s): VITAMINB12, FOLATE, FERRITIN, TIBC, IRON, RETICCTPCT in the last 72 hours. Urine analysis:    Component Value Date/Time   BILIRUBINUR neg 03/04/2011 0855   PROTEINUR trace 03/04/2011 0855   UROBILINOGEN neg 03/04/2011 0855   NITRITE neg 03/04/2011 0855   LEUKOCYTESUR neg  03/04/2011 0855    Radiological Exams on Admission: DG Chest 1 View  Result Date: 09/25/2021 CLINICAL DATA:  Dyspnea EXAM: CHEST  1 VIEW COMPARISON:  03/21/2013 FINDINGS: Cardiomegaly with vascular congestion, pulmonary edema and bilateral effusions. Bibasilar consolidations. Aortic atherosclerosis. No pneumothorax. Possible tracheal deviation to the right. Multifocal osseous sclerosis is suspicious for metastatic disease. IMPRESSION: 1. Cardiomegaly with vascular congestion, pulmonary edema and bilateral effusions. Bibasilar consolidations consistent with atelectasis or pneumonia 2. Multifocal osseous sclerosis, suspicious for metastatic disease. 3. Suspect tracheal deviation to the right, left paratracheal mass cannot be excluded. Suggest correlation with chest CT. Electronically Signed   By: Donavan Foil M.D.   On: 09/25/2021 20:03   DG Tibia/Fibula Right  Result Date: 09/25/2021 CLINICAL DATA:  Lower extremity wound EXAM: RIGHT TIBIA AND FIBULA - 2 VIEW COMPARISON:  None. FINDINGS: No fracture or malalignment. Diffuse soft tissue swelling. Vascular calcifications. No soft tissue emphysema IMPRESSION: No acute osseous abnormality Electronically Signed   By: Donavan Foil M.D.   On: 09/25/2021 20:05   CT CHEST WO CONTRAST  Result Date: 09/25/2021 CLINICAL DATA:  Evaluate pleural effusion EXAM: CT CHEST WITHOUT CONTRAST TECHNIQUE: Multidetector CT imaging of the chest was performed following the standard protocol without IV contrast. COMPARISON:  Chest x-ray from earlier in the same day FINDINGS: Cardiovascular: Atherosclerotic calcifications are noted without aneurysmal dilatation. Coronary calcifications are seen. No cardiac enlargement is noted. No pericardial effusion is seen. Pulmonary artery is not significantly enlarged. Mediastinum/Nodes: Thoracic inlet is within normal limits. No sizable hilar or mediastinal adenopathy is noted. The esophagus as visualized is within normal limits.  Lungs/Pleura: Bilateral large pleural effusions are noted. Associated basilar atelectasis is noted. Lungs are otherwise well aerated without focal infiltrate or parenchymal nodules. Upper Abdomen: Visualized upper abdomen demonstrates fullness of the renal pelves bilaterally of uncertain significance/chronicity. Musculoskeletal: Multiple sclerotic lesions are identified throughout the visualized bony skeleton consistent with metastatic disease. This raises suspicion for prostate carcinoma. IMPRESSION: Changes consistent with bony metastatic disease likely related to prostate carcinoma. Further workup is recommended. Bilateral pleural effusions with lower lobe atelectasis. No focal mass lesion is noted. Fullness in the collecting systems of the kidneys bilaterally which may be related to bladder outlet obstruction but incompletely evaluated on this exam. Aortic Atherosclerosis (ICD10-I70.0). Electronically Signed   By: Inez Catalina M.D.   On: 09/25/2021 20:41   DG Foot Complete Right  Result Date: 09/25/2021 CLINICAL DATA:  Right lower extremity wound infection EXAM: RIGHT FOOT COMPLETE - 3+ VIEW COMPARISON:  None. FINDINGS: No fracture or  malalignment. 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 Diffuse soft tissue swelling without emphysema. Vascular calcifications. Small plantar calcaneal spur. IMPRESSION: No acute osseous abnormality. Electronically Signed   By: Donavan Foil M.D.   On: 09/25/2021 20:04    EKG: Personally reviewed.  Sinus arrhythmia, rate 100, T wave inversions inferior and lateral leads.  Previous EKG from March 2019 showed sinus bradycardia, T wave changes are new from prior.  Assessment/Plan Principal Problem:   Sepsis due to cellulitis Daniel [place]) Active Problems:   Essential hypertension   Acute renal failure (ARF) (HCC)   Macrocytic anemia   Pleural effusion, bilateral   Daniel Andersen is a 87 y.o. male with medical history significant for hypertension and alcohol use who is  admitted with sepsis due to RLE cellulitis and acute renal failure.  Sepsis due to right lower extremity cellulitis, POA: Patient presenting with leukocytosis, RR >20, pulse >90, and significant right lower extremity cellulitis.  Large intact bullae present at base of right first toe dorsally. -Continue empiric IV vancomycin and cefepime -Follow blood cultures -Consult wound care  Acute renal failure: Creatinine 5.18 on admission.  No recent labs on file, previous baseline creatinine was ~0.7 in 2019.  Concern is for bladder outlet obstruction, possible prostate cancer.  Also with significant peripheral edema and large bilateral pleural effusions. -Obtain renal ultrasound -Obtain urinalysis, urine sodium and creatinine -Avoid NSAIDs and nephrotoxic agents -Monitor strict urine output -Hold further fluids and monitor renal function with diuresis -Insert Foley catheter for bladder decompression  Pulmonary edema/bilateral pleural effusions: Patient with large bilateral pleural effusions, significant peripheral edema, and BNP 3827.9 concerning for undiagnosed congestive heart failure. -Start on IV Lasix 40 mg twice daily -Monitor strict I/O's and daily weights -Obtain echocardiogram  Abnormal imaging concerning for bony metastatic disease: CT chest shows changes consistent with bony metastatic disease, felt likely related to prostate carcinoma. -Follow-up renal ultrasound as above, check PSA -Has some urine output with external catheter in place, insert Foley catheter as above -May need urology and oncology consultation while in [place]  Hypertension: Patient states he is no longer taking any medications.  Blood pressure stable on admission, will continue to monitor.  Macrocytic anemia: Hemoglobin 10.6.  No obvious bleeding.  Check anemia panel.  Elevated LFTs: LFTs mildly elevated.  Could be multifactorial related to sepsis, congestive hepatopathy.  Elevated alk phos may also be  related to osseous lesions. -Check RUQ ultrasound -Repeat labs in AM  Alcohol use: Patient reports drinking 1-2 beers daily, denies any alcohol use within the last week.  Place on CIWA checks to monitor for any signs for symptoms of withdrawal.  DVT prophylaxis: Subcutaneous heparin Code Status: Full code, confirmed on admission Family Communication: Discussed with patient's daughter at bedside Disposition Plan: From home, dispo pending clinical progress Consults called: None Level of care: Telemetry Admission status:  Status is: Inpatient  Remains inpatient appropriate because: Admitted with multiple medical conditions including sepsis due to severe RLE cellulitis requiring continued IV antibiotics, acute renal failure, suspected previously undiagnosed CHF requiring IV diuresis with further work-up including echocardiogram and close monitoring of physical exam and daily labs.  Zada Finders MD [facility]  If 7PM-7AM, please contact night-coverage [email or web address]  09/25/2021, 9:05 PM

## 2021-09-25 NOTE — ED Notes (Signed)
Coude RN at bedside to attempt 18Fr. Coude cath insertion per Dr. Posey Pronto

## 2021-09-25 NOTE — ED Triage Notes (Signed)
Brought by EMS form home after daughter called to report altered mental status and RLE wound infection since at least Thanksgiving. Vitals WNL en route per EMS, 20g IV in left AC present on arrival with 300cc normal saline bolus administered by EMS. RLE is very erythematous with weeping blisters on back of leg, top of foot, and a large intact blister on right great toe. Pt denies pain and complains only of being very thirsty.

## 2021-09-25 NOTE — ED Notes (Signed)
US at bedside

## 2021-09-25 NOTE — Sepsis Progress Note (Signed)
eLink is following this Code Sepsis. °

## 2021-09-25 NOTE — ED Notes (Signed)
Admitting provider at bedside.

## 2021-09-25 NOTE — Discharge Instructions (Addendum)
Patient is being discharged to [place] hosp[facility] facility.

## 2021-09-25 NOTE — ED Notes (Signed)
OK for pt to drink per Dr. Posey Pronto. RN provided pt with water as requested. Family remains at bedside with provider who is discussing plan of care.

## 2021-09-25 NOTE — ED Provider Notes (Signed)
[facility] DEPT Provider Note   CSN: [id number] Arrival date & time: 09/25/21  1730     History Chief Complaint  Patient presents with   Wound Infection   Altered Mental Status    Daniel Andersen is a 87 y.o. male.  87 year old male with prior medical history as detailed below presents for evaluation.  Patient was transported to ED by EMS.  Patient's daughter called EMS.  Patient with right lower extremity swelling and redness.  This appears to been an ongoing issue since for at least the last 2 to 3 weeks.  Patient is not very clear as to how long his leg has been red and swollen.  There are weeping blisters along the back of the leg, top of the foot, and on the right great toe.  The patient denies fevers.  He reports some pain in the right leg.  He denies chest pain or shortness of breath.  He is complaining of being very thirsty. He also requested a beer.  It does not appear that he has regular visits with routine outpatient care.  Additional history obtained from the patient's daughter upon her arrival.  The patient's leg has been red and swollen for at least 1 to 2 weeks.  The patient has refused to come to seek treatment.  The patient has a history of hypertension and possibly an enlarged prostate.  He does not have routine regular medical care.  Patient does drink at least 1-2 beers daily per the daughter's report.  The patient lives by himself.  The daughter checks on him 2-3 times per week. She reports that the patient is slightly more confused over the last two days (vs baseline).  The history is provided by the patient.  Illness Location:  Right lower extremity redness and swelling Severity:  Moderate Onset quality:  Gradual Duration:  3 weeks Timing:  Constant Progression:  Worsening Chronicity:  New Associated symptoms: no fever       Past Medical History:  Diagnosis Date   Hypertension    Low bone mass 03/2013   Bone Density scan    Obesity    Shingles 2005   Wears glasses    Wears partial dentures     Patient Active Problem List   Diagnosis Date Noted   Decreased mobility 02/24/2018   Leukocytosis 02/24/2018   Vitamin D deficiency 01/12/2018   History of fracture 01/12/2018   History of fall 01/12/2018   Edema 01/12/2018   Traumatic rupture of right quadriceps tendon 05/18/2017   Closed fracture of right patella 05/18/2017   Closed displaced fracture of phalanx of great toe, initial encounter 05/18/2017   Medicare annual wellness visit, subsequent 05/06/2016   Vaccine counseling 05/06/2016   Essential hypertension 04/02/2015   Impaired fasting glucose 04/02/2015   History of colonic polyps 04/02/2015   Osteopenia 04/02/2015   Obesity 04/02/2015    Past Surgical History:  Procedure Laterality Date   COLONOSCOPY W/ BIOPSIES AND POLYPECTOMY  2010   Benign polyps, repeat in 5 years   INGUINAL HERNIA REPAIR Right 11/2004   QUADRICEPS TENDON REPAIR Right 05/19/2017   Procedure: REPAIR QUADRICEP TENDON RIGHT;  Surgeon: Renette Butters, MD;  Location: [facility];  Service: Orthopedics;  Laterality: Right;   TONSILLECTOMY         History reviewed. No pertinent family history.  Social History   Tobacco Use   Smoking status: Former    Packs/day: 0.50    Years: 20.00  Pack years: 10.00    Types: Cigarettes   Smokeless tobacco: Never  Vaping Use   Vaping Use: Never used  Substance Use Topics   Alcohol use: Yes    Alcohol/week: 12.0 standard drinks    Types: 12 Cans of beer per week   Drug use: No    Home Medications Prior to Admission medications   Medication Sig Start Date End Date Taking? Authorizing Provider  alendronate (FOSAMAX) 70 MG tablet TAKE 1 TABLET BY MOUTH ONCE A WEEK WITH A FULL GLASS OF WATER ON AN EMPTY STOMACH 01/12/18   Tysinger, Camelia Eng, PA-C  amLODipine (NORVASC) 5 MG tablet TAKE 1 TABLET BY MOUTH EVERY DAY 10/24/19   Tysinger, Camelia Eng, PA-C  aspirin EC 325 MG tablet Take 1  tablet (325 mg total) by mouth daily. For 30 days post op for DVT Prophylaxis 05/19/17   Prudencio Burly III, PA-C  bisoprolol-hydrochlorothiazide [facility]) 5-6.25 MG tablet TAKE 1 TABLET BY MOUTH EVERY DAY 10/24/19   Tysinger, Camelia Eng, PA-C  Cholecalciferol (VITAMIN D) 1000 UNITS capsule Take 1,000 Units by mouth daily.     [provider]  fish oil-omega-3 fatty acids 1000 MG capsule Take 1 g by mouth daily.     [provider]  Multiple Vitamins-Minerals (CENTRUM SILVER ULTRA MENS) TABS Take 1 tablet by mouth daily.      [provider]    Allergies    Patient has no known allergies.  Review of Systems   Review of Systems  Constitutional:  Negative for fever.  All other systems reviewed and are negative.  Physical Exam Updated Vital Signs BP 137/76 (BP Location: Right Arm)   Pulse 99   Temp (!) 96.3 F (35.7 C) (Axillary)   Resp (!) 24   Ht 5\' 8"  (1.727 m)   Wt 77.1 kg   SpO2 99%   BMI 25.85 kg/m   Physical Exam Vitals and nursing note reviewed.  Constitutional:      General: He is not in acute distress.    Appearance: Normal appearance. He is well-developed.  HENT:     Head: Normocephalic and atraumatic.  Eyes:     Conjunctiva/sclera: Conjunctivae normal.     Pupils: Pupils are equal, round, and reactive to light.  Cardiovascular:     Rate and Rhythm: Normal rate and regular rhythm.     Heart sounds: Normal heart sounds.  Pulmonary:     Effort: Pulmonary effort is normal. No respiratory distress.     Breath sounds: Normal breath sounds.  Abdominal:     General: There is no distension.     Palpations: Abdomen is soft.     Tenderness: There is no abdominal tenderness.  Musculoskeletal:        General: No deformity. Normal range of motion.     Cervical back: Normal range of motion and neck supple.  Skin:    General: Skin is warm and dry.     Findings: Erythema present.     Comments: Significant erythema to the distal right lower  extremity with weeping blisters evident.  See photo below.  Neurological:     General: No focal deficit present.     Mental Status: He is alert and oriented to person, place, and time.         ED Results / Procedures / Treatments   Labs (all labs ordered are listed, but only abnormal results are displayed) Labs Reviewed  COMPREHENSIVE METABOLIC PANEL - Abnormal; Notable for the following  components:      Result Value   Sodium 134 (*)    CO2 17 (*)    Glucose, Bld 135 (*)    Creatinine, Ser 5.18 (*)    Calcium 8.4 (*)    Albumin 3.3 (*)    AST 71 (*)    ALT 157 (*)    Alkaline Phosphatase 199 (*)    Total Bilirubin 1.3 (*)    GFR, Estimated 10 (*)    Anion gap 17 (*)    All other components within normal limits  CBC WITH DIFFERENTIAL/PLATELET - Abnormal; Notable for the following components:   WBC 22.6 (*)    RBC 3.08 (*)    Hemoglobin 10.6 (*)    HCT 32.7 (*)    MCV 106.2 (*)    MCH 34.4 (*)    Neutro Abs 20.3 (*)    Lymphs Abs 0.4 (*)    Monocytes Absolute 1.7 (*)    Abs Immature Granulocytes 0.19 (*)    All other components within normal limits  CBG MONITORING, ED - Abnormal; Notable for the following components:   Glucose-Capillary 126 (*)    All other components within normal limits  RESP PANEL BY RT-PCR (FLU A&B, COVID) ARPGX2  CULTURE, BLOOD (ROUTINE X 2)  CULTURE, BLOOD (ROUTINE X 2)  LACTIC ACID, PLASMA  ETHANOL  LACTIC ACID, PLASMA  PROTIME-INR  TYPE AND SCREEN    EKG None  Radiology No results found.  Procedures Procedures   Medications Ordered in ED Medications - No data to display  ED Course  I have reviewed the triage vital signs and the nursing notes.  Pertinent labs & imaging results that were available during my care of the patient were reviewed by me and considered in my medical decision making (see chart for details).    MDM Rules/Calculators/A&P                           CRITICAL CARE Performed by: Valarie Merino   Total critical care time: 45 minutes  Critical care time was exclusive of separately billable procedures and treating other patients.  Critical care was necessary to treat or prevent imminent or life-threatening deterioration.  Critical care was time spent personally by me on the following activities: development of treatment plan with patient and/or surrogate as well as nursing, discussions with consultants, evaluation of patient's response to treatment, examination of patient, obtaining history from patient or surrogate, ordering and performing treatments and interventions, ordering and review of laboratory studies, ordering and review of radiographic studies, pulse oximetry and re-evaluation of patient's condition.   MDM  MSE complete  Daniel Andersen was evaluated in Emergency Department on 09/25/2021 for the symptoms described in the history of present illness. He was evaluated in the context of the global COVID-19 pandemic, which necessitated consideration that the patient might be at risk for infection with the SARS-CoV-2 virus that causes COVID-19. Institutional protocols and algorithms that pertain to the evaluation of patients at risk for COVID-19 are in a state of rapid change based on information released by regulatory bodies including the CDC and federal and state organizations. These policies and algorithms were followed during the patient's care in the ED.  Patient presents with RLE edema and erythema.   Patient with likely cellulitis of right LE.   Notable abnormal lab values included elevated WBC, elevated BUN/CR, and abnormal LFT's.  Broad-spectrum antibiotics administered for significant cellulitis of the right  leg.  IV fluids administered.   Patient would benefit for admission.  Hospitalist service is aware of case.   Final Clinical Impression(s) / ED Diagnoses Final diagnoses:  Cellulitis, unspecified cellulitis site  Renal insufficiency  Leukocytosis,  unspecified type    Rx / DC Orders ED Discharge Orders     None        Valarie Merino, MD 09/25/21 2007

## 2021-09-25 NOTE — Progress Notes (Signed)
Pharmacy Antibiotic Note  Daniel Andersen is a 87 y.o. male admitted on 09/25/2021 with cellulitis.  Pharmacy has been consulted for vancomycin and cefepime dosing.  Today, 09/25/21 WBC elevated SCr 5.18. CrCl < 10 mL/min. AKI  Plan: Cefepime 1 g IV q24h Vancomycin - no standing dose given poor renal function. LD already given tonight Monitor renal function, likely will need to dose vancomycin off of levels. SCr with AM labs tomorrow  Height: 5\' 8"  (172.7 cm) Weight: 77.1 kg (170 lb) IBW/kg (Calculated) : 68.4  Temp (24hrs), Avg:96.5 F (35.8 C), Min:96.3 F (35.7 C), Max:96.7 F (35.9 C)  Recent Labs  Lab 09/25/21 1835  WBC 22.6*  CREATININE 5.18*  LATICACIDVEN 1.5    Estimated Creatinine Clearance: 9.7 mL/min (A) (by C-G formula based on SCr of 5.18 mg/dL (H)).    No Known Allergies  Antimicrobials this admission: cefepime 12/7 >>  vancomycin 12/7 >>   Dose adjustments this admission:  Microbiology results: 12/7 BCx:    Thank you for allowing pharmacy to be a part of this patient's care.  Lenis Noon, PharmD 09/25/2021 9:23 PM

## 2021-09-25 NOTE — Progress Notes (Signed)
A consult was received from an ED physician for vancomycin and cefepime per pharmacy dosing.  The patient's profile has been reviewed for ht/wt/allergies/indication/available labs.    No Known Allergies  A one time order has been placed for cefepime 2 g + vancomycin 1500 mg IV once.    Further antibiotics/pharmacy consults should be ordered by admitting physician if indicated.                       Thank you, Lenis Noon, PharmD 09/25/2021  6:04 PM

## 2021-09-25 NOTE — ED Notes (Signed)
Attempted foley catheter insertion x 2 unsuccessfully; admitting provider notified. Pt may need urology assistance to insert coude cath

## 2021-09-26 ENCOUNTER — Inpatient Hospital Stay (HOSPITAL_COMMUNITY): Payer: Medicare (Managed Care)

## 2021-09-26 ENCOUNTER — Encounter (HOSPITAL_COMMUNITY)
Admission: EM | Disposition: A | Discharge status: Hospice | Payer: Self-pay | Source: Home / Self Care | Attending: Internal Medicine

## 2021-09-26 DIAGNOSIS — I5021 Acute systolic (congestive) heart failure: Secondary | ICD-10-CM

## 2021-09-26 LAB — IRON AND TIBC
Iron: 15 ug/dL — ABNORMAL LOW (ref 45–182)
Saturation Ratios: 9 % — ABNORMAL LOW (ref 17.9–39.5)
TIBC: 176 ug/dL — ABNORMAL LOW (ref 250–450)
UIBC: 161 ug/dL

## 2021-09-26 LAB — ECHOCARDIOGRAM COMPLETE
AR max vel: 1.86 cm2
AV Peak grad: 5.6 mmHg
Ao pk vel: 1.18 m/s
Area-P 1/2: 5.84 cm2
Calc EF: 32 %
Height: 68 in
S' Lateral: 4 cm
Single Plane A2C EF: 27.9 %
Single Plane A4C EF: 33.5 %
Weight: 2720 oz

## 2021-09-26 LAB — PROTIME-INR
INR: 2.1 — ABNORMAL HIGH (ref 0.8–1.2)
Prothrombin Time: 23.4 seconds — ABNORMAL HIGH (ref 11.4–15.2)

## 2021-09-26 LAB — COMPREHENSIVE METABOLIC PANEL
ALT: 128 U/L — ABNORMAL HIGH (ref 0–44)
AST: 56 U/L — ABNORMAL HIGH (ref 15–41)
Albumin: 2.6 g/dL — ABNORMAL LOW (ref 3.5–5.0)
Alkaline Phosphatase: 171 U/L — ABNORMAL HIGH (ref 38–126)
Anion gap: 16 — ABNORMAL HIGH (ref 5–15)
BUN: 128 mg/dL — ABNORMAL HIGH (ref 8–23)
CO2: 15 mmol/L — ABNORMAL LOW (ref 22–32)
Calcium: 8.2 mg/dL — ABNORMAL LOW (ref 8.9–10.3)
Chloride: 103 mmol/L (ref 98–111)
Creatinine, Ser: 4.93 mg/dL — ABNORMAL HIGH (ref 0.61–1.24)
GFR, Estimated: 11 mL/min — ABNORMAL LOW (ref >60)
Glucose, Bld: 108 mg/dL — ABNORMAL HIGH (ref 70–99)
Potassium: 4.4 mmol/L (ref 3.5–5.1)
Sodium: 134 mmol/L — ABNORMAL LOW (ref 135–145)
Total Bilirubin: 1.2 mg/dL (ref 0.3–1.2)
Total Protein: 6.3 g/dL — ABNORMAL LOW (ref 6.5–8.1)

## 2021-09-26 LAB — FOLATE: Folate: 32.3 ng/mL (ref >5.9)

## 2021-09-26 LAB — PSA: Prostatic Specific Antigen: 246.08 ng/mL — ABNORMAL HIGH (ref 0.00–4.00)

## 2021-09-26 LAB — HEPATITIS PANEL, ACUTE
HCV Ab: NONREACTIVE
Hep A IgM: NONREACTIVE
Hep B C IgM: NONREACTIVE
Hepatitis B Surface Ag: NONREACTIVE

## 2021-09-26 LAB — MRSA NEXT GEN BY PCR, NASAL: MRSA by PCR Next Gen: NOT DETECTED

## 2021-09-26 LAB — PROCALCITONIN: Procalcitonin: 6.98 ng/mL

## 2021-09-26 LAB — VITAMIN B12: Vitamin B-12: 1404 pg/mL — ABNORMAL HIGH (ref 180–914)

## 2021-09-26 LAB — FERRITIN: Ferritin: 322 ng/mL (ref 24–336)

## 2021-09-26 LAB — MAGNESIUM: Magnesium: 2.2 mg/dL (ref 1.7–2.4)

## 2021-09-26 LAB — GLUCOSE, CAPILLARY: Glucose-Capillary: 107 mg/dL — ABNORMAL HIGH (ref 70–99)

## 2021-09-26 SURGERY — LEFT HEART CATH AND CORONARY ANGIOGRAPHY
Anesthesia: LOCAL

## 2021-09-26 MED ORDER — METOPROLOL TARTRATE 25 MG PO TABS
12.5000 mg | ORAL_TABLET | Freq: Two times a day (BID) | ORAL | Status: DC
  Administered 2021-09-26: 12.5 mg via ORAL
  Filled 2021-09-26: qty 1

## 2021-09-26 MED ORDER — METOPROLOL TARTRATE 5 MG/5ML IV SOLN
5.0000 mg | INTRAVENOUS | Status: AC
  Administered 2021-09-26: 5 mg via INTRAVENOUS
  Filled 2021-09-26: qty 5

## 2021-09-26 MED ORDER — CHLORHEXIDINE GLUCONATE CLOTH 2 % EX PADS
6.0000 | MEDICATED_PAD | Freq: Every day | CUTANEOUS | Status: DC
  Administered 2021-09-26 – 2021-10-05 (×9): 6 via TOPICAL

## 2021-09-26 MED ORDER — DEGARELIX ACETATE(240 MG DOSE) 120 MG/VIAL ~~LOC~~ SOLR
240.0000 mg | Freq: Once | SUBCUTANEOUS | Status: AC
  Administered 2021-09-27: 240 mg via SUBCUTANEOUS
  Filled 2021-09-26 (×2): qty 6

## 2021-09-26 MED ORDER — MELATONIN 3 MG PO TABS
3.0000 mg | ORAL_TABLET | Freq: Every evening | ORAL | Status: DC | PRN
  Administered 2021-09-26 – 2021-10-02 (×3): 3 mg via ORAL
  Filled 2021-09-26 (×4): qty 1

## 2021-09-26 MED ORDER — PERFLUTREN LIPID MICROSPHERE
1.0000 mL | INTRAVENOUS | Status: AC | PRN
Start: 2021-09-26 — End: 2021-09-26
  Administered 2021-09-26: 2 mL via INTRAVENOUS
  Filled 2021-09-26: qty 10

## 2021-09-26 MED ORDER — METOPROLOL TARTRATE 25 MG PO TABS
25.0000 mg | ORAL_TABLET | Freq: Two times a day (BID) | ORAL | Status: DC
  Administered 2021-09-26 – 2021-10-05 (×17): 25 mg via ORAL
  Filled 2021-09-26 (×18): qty 1

## 2021-09-26 NOTE — Progress Notes (Signed)
PROGRESS NOTE    Daniel Andersen  RDE:[id number] DOB: [birth year] DOA: 09/25/2021 PCP: Carlena Hurl, PA-C    Brief Narrative:  Daniel Andersen is a 87 year old male with past medical history significant for essential hypertension, and alcohol use who presented to [facility] ED on 12/7 from home via EMS due to altered mental status and worsening right lower extremity wound since Thanksgiving.  Patient has also reported worsening lower extremity edema with associated erythema since this timeframe as well.  Patient denies dysuria, no chest pain, no shortness of breath, no cough/congestion, no nausea/vomiting/diarrhea, no abdominal pain.  In the ED, BP 137/73, HR 99, RR 26, temperature 96.3 F, SPO2 96% on room air.  Sodium 134, potassium 5.0, chloride 100, CO2 17, glucose 135, BUN 115, creatinine 5.18, alkaline phosphatase 199, AST 71, ALT 157, total bilirubin 1.3.  BNP 3000 and urine 27.9.  Lactic acid 1.5.  WBC 22.6.  Hemoglobin 10.6, platelet count 180.  COVID-19 PCR negative.  Influenza A/B PCR negative.  EtOH level less than 10.  Chest x-ray with cardiomegaly, vascular congestion, pulmonary edema and bilateral effusions.  Right foot and tibia/fibula x-ray with no acute osseous abnormality.  Renal ultrasound with significant hydronephrosis bilaterally extending to the level of the urinary bladder suggestive of bladder outlet obstruction.  CT chest without contrast showed changes consistent with bony metastatic disease likely related to prostate carcinoma, bilateral pleural effusions with lower lobe atelectasis with no focal mass lesion noted.  Patient was started on IV vancomycin and cefepime and received 500 cc normal saline.  Hospital service consulted for further evaluation and management.   Assessment & Plan:   Principal Problem:   Sepsis due to cellulitis [facility]) Active Problems:   Essential hypertension   Acute renal failure (ARF) (HCC)   Macrocytic anemia   Pleural effusion,  bilateral   Sepsis, POA Right lower extremity cellulitis Patient presenting to the ED with leukocytosis, respiratory rate greater than 20, HR greater than 90 and significant right lower extremity cellulitis with large intact bullae present at the base of the right first toe.  WBC count elevated 22.6 with procalcitonin 6.98. --WBC 22.6 --Blood cultures x2: No growth x24 hours --Urine culture ordered, not obtained initially --Check MRSA PCR --Vancomycin --Cefepime 1 g IV every 24 hours --CBC daily  Acute renal failure Anion gap metabolic acidosis Obstructive uropathy Patient presenting with a creatinine of 5.18. FeNa = 0.5%; which is consistent with a prerenal etiology.  Additionally, patient with likely chronic outlet obstruction 2/2 BPH with likely prostate cancer complicating picture.  Renal ultrasound with significant hydronephrosis bilaterally extending to the level of the urinary bladder suggestive of bladder outlet obstruction. Foley catheter was placed. --Urology following, appreciate assistance --Cr 5.18>4.93 --Continue Foley catheter --Strict I's and O's; monitoring for postobstructive diuresis --Avoid nephrotoxins, renal dose all medications --Monitor renal function daily  Systolic congestive heart failure, new diagnosis with decompensation Patient with large bilateral pleural effusions, significant peripheral edema, and BNP 3827.9.  TTE with LVEF 25-30%, LV severely decreased function with global hypokinesis, mild concentric LVH, LV diastolic parameters indeterminate, LA mildly dilated, RV systolic function severely reduced, moderate pleural effusion left lateral region, trivial MR, IVC dilated. --Cardiology following, appreciate assistance --Metoprolol tartrate 25 mg p.o. twice daily --Furosemide 40 mg IV q12h --Fluid restriction 1200 mL/day --Strict I's and O's and daily weights  Osseous metastatic lesions concerning for prostate cancer as primary Several osseous lesions  noted on imaging, elevated alkaline phosphatase, andelevated PSA 246.08; highly concerning for prostate  cancer. --Urology following, will likely need further imaging of abdomen/pelvis and tissue biopsy.  Macrocytic anemia Hemoglobin 10.6 with MCV 106.2 on admission.  Anemia panel with iron 15, TIBC 176, ferritin 322, B12 1404, folate 32.3.  Etiology likely secondary to anemia of chronic medical disease.  Elevated LFTs Etiology likely multifactorial, related to sepsis, congestive hepatopathy.  Also with elevated alkaline phosphatase, likely related to osseous lesions from suspected prostate cancer.  EtOH level less than 10.  Right upper quadrant ultrasound with no focal liver lesion identified, parenchymal echogenicity density within normal limits, portal vein patent.  Alcohol cessation. --AST 71>56 --ALT 157>128 --Avoid hepatotoxins --CMP daily  Alcohol use disorder Patient reports drinking 1-2 beers daily, denies any use within the last week.  Counseled on need for cessation --CIWA protocol  DVT prophylaxis: heparin injection 5,000 Units Start: 09/25/21 2200   Code Status: Full Code Family Communication: Updated daughter present at bedside this morning  Disposition Plan:  Level of care: Telemetry Status is: Inpatient  Remains inpatient appropriate because: Continues on IV antibiotics for sepsis related to severe cellulitis right lower extremity, on IV diuresis for new diagnosis of systolic congestive heart failure.     Consultants:  Urology Cardiology  Procedures:  Foley catheter placement 12/7 TTE  Antimicrobials:  Vancomycin Cefepime   Subjective: Patient seen examined at bedside, continues in ED holding area.  Daughter present at bedside.  No specific complaints at this time.  Is currently tachycardic but asymptomatic.  Foley catheter noted draining blood-tinged urine.  Discussed findings of new onset heart failure, renal failure, and concern for likely metastatic  prostate cancer.  No other questions or concerns at this time.  Denies headache, no chest pain, no palpitations, no current shortness of breath, no abdominal pain, no weakness, no fatigue, no paresthesias.  Nursing staff at bedside concerned about tachycardia, discussed medication changes otherwise no other concerns at this time.  Objective: Vitals:   09/26/21 1015 09/26/21 1030 09/26/21 1100 09/26/21 1200  BP:  100/84 (!) 88/56 105/61  Pulse: (!) 136 60 78 79  Resp: (!) 26 (!) 23 18 (!) 22  Temp:      TempSrc:      SpO2: 98% 98% 97% 96%  Weight:      Height:        Intake/Output Summary (Last 24 hours) at 09/26/2021 1412 Last data filed at 09/25/2021 2251 Gross per 24 hour  Intake 1200 ml  Output --  Net 1200 ml   Filed Weights   09/25/21 1756  Weight: 77.1 kg    Examination:  General exam: Appears calm and comfortable, appears older than stated age, chronically ill in appearance Titian Respiratory system: Breath sounds slightly decreased bilateral bases, mild crackles, no wheezing, normal respiratory effort without accessory muscle use, on room air with SPO2 97% at rest Cardiovascular system: S1 & S2 heard, tachycardic, irregularly irregular rhythm. No JVD, murmurs, rubs, gallops or clicks.  Gastrointestinal system: Abdomen is nondistended, soft and nontender. No organomegaly or masses felt. Normal bowel sounds heard. Central nervous system: Alert and oriented. No focal neurological deficits. Extremities: Moves all extremities independently, 3+ pitting edema bilateral lower extremities up to knee Skin: Right lower extremity with erythema from right foot extending proximally about two thirds up to the knee with large intact bullae dorsal aspect of base of first toe with significant bilateral edema Psychiatry: Judgement and insight appear poor. Mood & affect appropriate.          Data Reviewed: I have personally  reviewed following labs and imaging studies  CBC: Recent  Labs  Lab 09/25/21 1835  WBC 22.6*  NEUTROABS 20.3*  HGB 10.6*  HCT 32.7*  MCV 106.2*  PLT 491   Basic Metabolic Panel: Recent Labs  Lab 09/25/21 1835 09/26/21 0605  NA 134* 134*  K 5.0 4.4  CL 100 103  CO2 17* 15*  GLUCOSE 135* 108*  BUN 115* 128*  CREATININE 5.18* 4.93*  CALCIUM 8.4* 8.2*  MG  --  2.2   GFR: Estimated Creatinine Clearance: 10.2 mL/min (A) (by C-G formula based on SCr of 4.93 mg/dL (H)). Liver Function Tests: Recent Labs  Lab 09/25/21 1835 09/26/21 0605  AST 71* 56*  ALT 157* 128*  ALKPHOS 199* 171*  BILITOT 1.3* 1.2  PROT 7.1 6.3*  ALBUMIN 3.3* 2.6*   No results for input(s): LIPASE, AMYLASE in the last 168 hours. No results for input(s): AMMONIA in the last 168 hours. Coagulation Profile: Recent Labs  Lab 09/25/21 1835 09/26/21 0605  INR 2.4* 2.1*   Cardiac Enzymes: No results for input(s): CKTOTAL, CKMB, CKMBINDEX, TROPONINI in the last 168 hours. BNP (last 3 results) No results for input(s): PROBNP in the last 8760 hours. HbA1C: No results for input(s): HGBA1C in the last 72 hours. CBG: Recent Labs  Lab 09/25/21 1815  GLUCAP 126*   Lipid Profile: No results for input(s): CHOL, HDL, LDLCALC, TRIG, CHOLHDL, LDLDIRECT in the last 72 hours. Thyroid Function Tests: No results for input(s): TSH, T4TOTAL, FREET4, T3FREE, THYROIDAB in the last 72 hours. Anemia Panel: Recent Labs    09/26/21 0605  VITAMINB12 1,404*  FOLATE 32.3  FERRITIN 322  TIBC 176*  IRON 15*   Sepsis Labs: Recent Labs  Lab 09/25/21 1835 09/26/21 0605  PROCALCITON  --  6.98  LATICACIDVEN 1.5  --     Recent Results (from the past 240 hour(s))  Culture, blood (routine x 2)     Status: None (Preliminary result)   Collection Time: 09/25/21  6:35 PM   Specimen: BLOOD  Result Value Ref Range Status   Specimen Description   Final    BLOOD SITE NOT SPECIFIED Performed at [facility] [address] [place], [place] [address]     Special Requests   Final    BOTTLES DRAWN AEROBIC AND ANAEROBIC Blood Culture adequate volume Performed at [facility] [address] [place], [place] [address]    Culture   Final    NO GROWTH < 12 HOURS Performed at [facility] [address], [place], [place] [address]    Report Status PENDING  Incomplete  Culture, blood (routine x 2)     Status: None (Preliminary result)   Collection Time: 09/25/21  6:35 PM   Specimen: BLOOD  Result Value Ref Range Status   Specimen Description   Final    BLOOD BLOOD LEFT HAND Performed at [facility] [address], [place], [place] [address]    Special Requests   Final    BOTTLES DRAWN AEROBIC AND ANAEROBIC Blood Culture results may not be optimal due to an inadequate volume of blood received in culture bottles Performed at [facility] [address] [place], [place] [address]    Culture   Final    NO GROWTH < 12 HOURS Performed at [facility] [address] [place], [place] [address]    Report Status PENDING  Incomplete  Resp Panel by RT-PCR (Flu A&B, Covid)  Peripheral     Status: None   Collection Time: 09/25/21  6:35 PM   Specimen: Peripheral; Nasopharyngeal(NP) swabs in vial transport medium  Result Value Ref Range Status   SARS Coronavirus 2 by RT PCR NEGATIVE NEGATIVE Final    Comment: (NOTE) SARS-CoV-2 target nucleic acids are NOT DETECTED.  The SARS-CoV-2 RNA is generally detectable in upper respiratory specimens during the acute phase of infection. The lowest concentration of SARS-CoV-2 viral copies this assay can detect is 138 copies/mL. A negative result does not preclude SARS-Cov-2 infection and should not be used as the sole basis for treatment or other patient management decisions. A negative result may occur with  improper specimen collection/handling, submission of specimen other than nasopharyngeal swab, presence of viral mutation(s) within  the areas targeted by this assay, and inadequate number of viral copies(<138 copies/mL). A negative result must be combined with clinical observations, patient history, and epidemiological information. The expected result is Negative.  Fact Sheet for Patients:  EntrepreneurPulse.com.au  Fact Sheet for Healthcare Providers:  IncredibleEmployment.be  This test is no t yet approved or cleared by the [place] FDA and  has been authorized for detection and/or diagnosis of SARS-CoV-2 by FDA under an Emergency Use Authorization (EUA). This EUA will remain  in effect (meaning this test can be used) for the duration of the COVID-19 declaration under Section 564(b)(1) of the Act, 21 U.S.C.section 360bbb-3(b)(1), unless the authorization is terminated  or revoked sooner.       Influenza A by PCR NEGATIVE NEGATIVE Final   Influenza B by PCR NEGATIVE NEGATIVE Final    Comment: (NOTE) The Xpert Xpress SARS-CoV-2/FLU/RSV plus assay is intended as an aid in the diagnosis of influenza from Nasopharyngeal swab specimens and should not be used as a sole basis for treatment. Nasal washings and aspirates are unacceptable for Xpert Xpress SARS-CoV-2/FLU/RSV testing.  Fact Sheet for Patients: EntrepreneurPulse.com.au  Fact Sheet for Healthcare Providers: IncredibleEmployment.be  This test is not yet approved or cleared by the [place] FDA and has been authorized for detection and/or diagnosis of SARS-CoV-2 by FDA under an Emergency Use Authorization (EUA). This EUA will remain in effect (meaning this test can be used) for the duration of the COVID-19 declaration under Section 564(b)(1) of the Act, 21 U.S.C. section 360bbb-3(b)(1), unless the authorization is terminated or revoked.  Performed at [facility], [place] [address] [place], [place] [address]          Radiology Studies: DG Chest  1 View  Result Date: 09/25/2021 CLINICAL DATA:  Dyspnea EXAM: CHEST  1 VIEW COMPARISON:  03/21/2013 FINDINGS: Cardiomegaly with vascular congestion, pulmonary edema and bilateral effusions. Bibasilar consolidations. Aortic atherosclerosis. No pneumothorax. Possible tracheal deviation to the right. Multifocal osseous sclerosis is suspicious for metastatic disease. IMPRESSION: 1. Cardiomegaly with vascular congestion, pulmonary edema and bilateral effusions. Bibasilar consolidations consistent with atelectasis or pneumonia 2. Multifocal osseous sclerosis, suspicious for metastatic disease. 3. Suspect tracheal deviation to the right, left paratracheal mass cannot be excluded. Suggest correlation with chest CT. Electronically Signed   By: Donavan Foil M.D.   On: 09/25/2021 20:03   DG Tibia/Fibula Right  Result Date: 09/25/2021 CLINICAL DATA:  Lower extremity wound EXAM: RIGHT TIBIA AND FIBULA - 2 VIEW COMPARISON:  None. FINDINGS: No fracture or malalignment. Diffuse soft tissue swelling. Vascular calcifications. No soft tissue emphysema IMPRESSION: No acute osseous abnormality Electronically Signed   By: Donavan Foil M.D.   On: 09/25/2021 20:05   CT CHEST WO  CONTRAST  Result Date: 09/25/2021 CLINICAL DATA:  Evaluate pleural effusion EXAM: CT CHEST WITHOUT CONTRAST TECHNIQUE: Multidetector CT imaging of the chest was performed following the standard protocol without IV contrast. COMPARISON:  Chest x-ray from earlier in the same day FINDINGS: Cardiovascular: Atherosclerotic calcifications are noted without aneurysmal dilatation. Coronary calcifications are seen. No cardiac enlargement is noted. No pericardial effusion is seen. Pulmonary artery is not significantly enlarged. Mediastinum/Nodes: Thoracic inlet is within normal limits. No sizable hilar or mediastinal adenopathy is noted. The esophagus as visualized is within normal limits. Lungs/Pleura: Bilateral large pleural effusions are noted. Associated  basilar atelectasis is noted. Lungs are otherwise well aerated without focal infiltrate or parenchymal nodules. Upper Abdomen: Visualized upper abdomen demonstrates fullness of the renal pelves bilaterally of uncertain significance/chronicity. Musculoskeletal: Multiple sclerotic lesions are identified throughout the visualized bony skeleton consistent with metastatic disease. This raises suspicion for prostate carcinoma. IMPRESSION: Changes consistent with bony metastatic disease likely related to prostate carcinoma. Further workup is recommended. Bilateral pleural effusions with lower lobe atelectasis. No focal mass lesion is noted. Fullness in the collecting systems of the kidneys bilaterally which may be related to bladder outlet obstruction but incompletely evaluated on this exam. Aortic Atherosclerosis (ICD10-I70.0). Electronically Signed   By: Inez Catalina M.D.   On: 09/25/2021 20:41   US RENAL  Result Date: 09/25/2021 CLINICAL DATA:  Acute renal failure EXAM: RENAL / URINARY TRACT ULTRASOUND COMPLETE COMPARISON:  None. FINDINGS: Right Kidney: Renal measurements: 11.5 x 4.7 x 4.2 cm. = volume: 18 mL. Significant hydronephrosis is noted. Continues into the proximal right ureter. Left Kidney: Renal measurements: 11.5 x 5.7 x 4.9 cm. = volume: 169 mL. Significant hydronephrosis is noted similar to that seen on the right. 1.5 cm cyst is noted inferiorly. Bladder: Bladder is well distended. Some mild irregularity of the posterior bladder wall is noted without discrete mass. May represent increased trabeculation possibly related to bladder outlet obstruction. Distal ureters appear dilated. Note is made of a left-sided pleural effusion. Other: None. IMPRESSION: Significant hydronephrosis bilaterally which extends to the level of the urinary bladder. Findings suggestive of bladder outlet obstruction are noted. Repeat imaging following decompression of the bladder may be helpful as clinically indicated. Left  pleural effusion. Left renal cyst. Electronically Signed   By: Inez Catalina M.D.   On: 09/25/2021 22:20   DG Foot Complete Right  Result Date: 09/25/2021 CLINICAL DATA:  Right lower extremity wound infection EXAM: RIGHT FOOT COMPLETE - 3+ VIEW COMPARISON:  None. FINDINGS: No fracture or malalignment. 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 Diffuse soft tissue swelling without emphysema. Vascular calcifications. Small plantar calcaneal spur. IMPRESSION: No acute osseous abnormality. Electronically Signed   By: Donavan Foil M.D.   On: 09/25/2021 20:04   ECHOCARDIOGRAM COMPLETE  Result Date: 09/26/2021    ECHOCARDIOGRAM REPORT   Patient Name:   PAIGE VANDERWOUDE Date of Exam: 09/26/2021 Medical Rec #:  [id number]       Height:       68.0 in Accession #:    [id number]      Weight:       170.0 lb Date of Birth:  09/19/1932       BSA:          1.907 m Patient Age:    62 years        BP:           122/72 mmHg Patient Gender: M  HR:           141 bpm. Exam Location:  Inpatient Procedure: 2D Echo, Color Doppler, Cardiac Doppler and Intracardiac            Opacification Agent Indications:     CHF  History:         Patient has no prior history of Echocardiogram examinations.                  Risk Factors:Hypertension.  Sonographer:     Jyl Heinz Referring Phys:  [id number] Cleaster Corin PATEL Diagnosing Phys: Gwyndolyn Kaufman MD IMPRESSIONS  1. Left ventricular ejection fraction, by estimation, is 25 to 30%. The left ventricle has severely decreased function. The left ventricle demonstrates global hypokinesis. There is mild concentric left ventricular hypertrophy. Left ventricular diastolic  parameters are indeterminate. Elevated left atrial pressure.  2. Right ventricular systolic function is severely reduced. The right ventricular size is normal.  3. Left atrial size was mildly dilated.  4. Moderate pleural effusion in the left lateral region.  5. The mitral valve is degenerative. Trivial mitral valve  regurgitation. Moderate mitral annular calcification.  6. The aortic valve is tricuspid. There is mild calcification of the aortic valve. There is mild thickening of the aortic valve. Aortic valve regurgitation is trivial. Aortic valve sclerosis is present, with no evidence of aortic valve stenosis.  7. The inferior vena cava is dilated in size with <50% respiratory variability, suggesting right atrial pressure of 15 mmHg.  8. Tachycardic during study with HR 130s with no discernable p-waves seen (possible Afib) Comparison(s): No prior Echocardiogram. FINDINGS  Left Ventricle: Left ventricular ejection fraction, by estimation, is 25 to 30%. The left ventricle has severely decreased function. The left ventricle demonstrates global hypokinesis. The left ventricular internal cavity size was normal in size. There is mild concentric left ventricular hypertrophy. Left ventricular diastolic parameters are indeterminate. Elevated left atrial pressure. Right Ventricle: The right ventricular size is normal. No increase in right ventricular wall thickness. Right ventricular systolic function is severely reduced. Left Atrium: Left atrial size was mildly dilated. Right Atrium: Right atrial size was normal in size. Pericardium: There is no evidence of pericardial effusion. Mitral Valve: The mitral valve is degenerative in appearance. There is mild thickening of the mitral valve leaflet(s). There is mild calcification of the mitral valve leaflet(s). Moderate mitral annular calcification. Trivial mitral valve regurgitation. Tricuspid Valve: The tricuspid valve is normal in structure. Tricuspid valve regurgitation is trivial. Aortic Valve: The aortic valve is tricuspid. There is mild calcification of the aortic valve. There is mild thickening of the aortic valve. Aortic valve regurgitation is trivial. Aortic valve sclerosis is present, with no evidence of aortic valve stenosis. Aortic valve peak gradient measures 5.6 mmHg. Pulmonic  Valve: The pulmonic valve was normal in structure. Pulmonic valve regurgitation is trivial. Aorta: The aortic root and ascending aorta are structurally normal, with no evidence of dilitation. Venous: The inferior vena cava is dilated in size with less than 50% respiratory variability, suggesting right atrial pressure of 15 mmHg. IAS/Shunts: No atrial level shunt detected by color flow Doppler. Additional Comments: There is a moderate pleural effusion in the left lateral region.  LEFT VENTRICLE PLAX 2D LVIDd:         4.90 cm      Diastology LVIDs:         4.00 cm      LV e' medial:    5.26 cm/s LV PW:  1.10 cm      LV E/e' medial:  20.2 LV IVS:        1.10 cm      LV e' lateral:   7.07 cm/s LVOT diam:     2.00 cm      LV E/e' lateral: 15.0 LV SV:         30 LV SV Index:   16 LVOT Area:     3.14 cm  LV Volumes (MOD) LV vol d, MOD A2C: 140.0 ml LV vol d, MOD A4C: 135.0 ml LV vol s, MOD A2C: 101.0 ml LV vol s, MOD A4C: 89.8 ml LV SV MOD A2C:     39.0 ml LV SV MOD A4C:     135.0 ml LV SV MOD BP:      44.9 ml RIGHT VENTRICLE            IVC RV Basal diam:  3.60 cm    IVC diam: 2.20 cm RV Mid diam:    3.10 cm RV S prime:     6.92 cm/s TAPSE (M-mode): 0.7 cm LEFT ATRIUM             Index        RIGHT ATRIUM           Index LA diam:        4.10 cm 2.15 cm/m   RA Area:     16.60 cm LA Vol (A2C):   70.3 ml 36.86 ml/m  RA Volume:   42.00 ml  22.02 ml/m LA Vol (A4C):   66.0 ml 34.60 ml/m LA Biplane Vol: 71.6 ml 37.54 ml/m  AORTIC VALVE                 PULMONIC VALVE AV Area (Vmax): 1.86 cm     PR End Diast Vel: 1.56 msec AV Vmax:        118.00 cm/s AV Peak Grad:   5.6 mmHg LVOT Vmax:      70.00 cm/s LVOT Vmean:     49.900 cm/s LVOT VTI:       0.094 m  AORTA Ao Root diam: 2.90 cm Ao Asc diam:  3.20 cm MITRAL VALVE                TRICUSPID VALVE MV Area (PHT): 5.84 cm     TR Peak grad:   19.7 mmHg MV Decel Time: 130 msec     TR Vmax:        222.00 cm/s MV E velocity: 106.00 cm/s                             SHUNTS                              Systemic VTI:  0.09 m                             Systemic Diam: 2.00 cm Gwyndolyn Kaufman MD Electronically signed by Gwyndolyn Kaufman MD Signature Date/Time: 09/26/2021/10:58:34 AM    Final (Updated)    US Abdomen Limited RUQ (LIVER/GB)  Result Date: 09/25/2021 CLINICAL DATA:  Elevated LFT EXAM: ULTRASOUND ABDOMEN LIMITED RIGHT UPPER QUADRANT COMPARISON:  None. FINDINGS: Gallbladder: No gallstones or wall thickening visualized. No sonographic Murphy sign noted by sonographer. Common bile duct: Diameter: 4 mm Liver: No focal lesion  identified. Within normal limits in parenchymal echogenicity. Portal vein is patent on color Doppler imaging with normal direction of blood flow towards the liver. Other: Right pleural effusion. Incompletely evaluated right hydronephrosis IMPRESSION: 1. Ultrasound appearance of the liver is within normal limits. 2. Right pleural effusion 3. Incompletely visualized right hydronephrosis Electronically Signed   By: Donavan Foil M.D.   On: 09/25/2021 22:23        Scheduled Meds:  furosemide  40 mg Intravenous Q12H   heparin  5,000 Units Subcutaneous Q8H   metoprolol tartrate  25 mg Oral BID   sodium chloride flush  3 mL Intravenous Q12H   vancomycin variable dose per unstable renal function (pharmacist dosing)   Does not apply See admin instructions   Continuous Infusions:  ceFEPime (MAXIPIME) IV       LOS: 1 day    Time spent: 49 minutes spent on chart review, discussion with nursing staff, consultants, updating family and interview/physical exam; more than 50% of that time was spent in counseling and/or coordination of care.    Tamecia Mcdougald J [place] DO [facility] Available via Epic secure chat 7am-7pm After these hours, please refer to coverage provider listed on [email or web address] 09/26/2021, 2:12 PM

## 2021-09-26 NOTE — Progress Notes (Signed)
Coude RN attempted to inserted 18 Fr coude catheter. Unsuccessful. Insertion attempt painful for the patient even with lidocaine jelly application. . Blood tinged secretion noted  after coude insertion attempt at opening of urethral. Will make assigned nurse aware and attending MD aware of unsuccessful insertion.

## 2021-09-26 NOTE — ED Notes (Signed)
Pt provided coffee  

## 2021-09-26 NOTE — Progress Notes (Signed)
Pt arrived to unit on floor bed from ED. VSS. Pt sleepy but awakens on calling. Oriented to person, place, year. Unclear on date. Denies pain other than when right leg is touched. Pt oriented to callbell and environment. POC discussed with pt and dtr at bedside.

## 2021-09-26 NOTE — Consult Note (Addendum)
Urology Consult   Physician requesting consult: Zada Finders MD  Reason for consult: Urinary retention, difficult foley, possible prostate malignancy   History of Present Illness: Daniel Andersen is a 87 y.o. who presented to the ED due right lower extremity infection. On work up in the ED he was found to be in renal failure with creatinine of 5.2. Renal ultrasound was obtained which shows bilateral hydronephrosis and a distended bladder concerning for bladder outlet obstruction. Nursing attempted to place regular foley and coude catheter without success so urology was consulted. Patient also had CT chest on admission which shows bony metastatic disease concerning for possible prostate primary malignancy.   Patient is a very poor historian. States that he does not have trouble peeing at home. Denies hematuria. Does not see doctors regularly. Does not know if his PSA has been followed previously. Denies any family history of prostate cancer.   Past Medical History:  Diagnosis Date   Hypertension    Low bone mass 03/2013   Bone Density scan   Obesity    Shingles 2005   Wears glasses    Wears partial dentures     Past Surgical History:  Procedure Laterality Date   COLONOSCOPY W/ BIOPSIES AND POLYPECTOMY  2010   Benign polyps, repeat in 5 years   INGUINAL HERNIA REPAIR Right 11/2004   QUADRICEPS TENDON REPAIR Right 05/19/2017   Procedure: REPAIR QUADRICEP TENDON RIGHT;  Surgeon: Renette Butters, MD;  Location: [place];  Service: Orthopedics;  Laterality: Right;   TONSILLECTOMY      Current Hospital Medications:  Home Meds:  No current facility-administered medications on file prior to encounter.   Current Outpatient Medications on File Prior to Encounter  Medication Sig Dispense Refill   alendronate (FOSAMAX) 70 MG tablet TAKE 1 TABLET BY MOUTH ONCE A WEEK WITH A FULL GLASS OF WATER ON AN EMPTY STOMACH 12 tablet 3   aspirin EC 325 MG tablet Take 1 tablet (325 mg total) by mouth  daily. For 30 days post op for DVT Prophylaxis 30 tablet 0   Multiple Vitamins-Minerals (CENTRUM SILVER ULTRA MENS) TABS Take 1 tablet by mouth daily.       amLODipine (NORVASC) 5 MG tablet TAKE 1 TABLET BY MOUTH EVERY DAY 30 tablet 0   bisoprolol-hydrochlorothiazide (ZIAC) 5-6.25 MG tablet TAKE 1 TABLET BY MOUTH EVERY DAY 30 tablet 0   Cholecalciferol (VITAMIN D) 1000 UNITS capsule Take 1,000 Units by mouth daily.      fish oil-omega-3 fatty acids 1000 MG capsule Take 1 g by mouth daily.        Scheduled Meds:  furosemide  40 mg Intravenous Q12H   heparin  5,000 Units Subcutaneous Q8H   sodium chloride flush  3 mL Intravenous Q12H   vancomycin variable dose per unstable renal function (pharmacist dosing)   Does not apply See admin instructions   Continuous Infusions:  ceFEPime (MAXIPIME) IV     PRN Meds:.acetaminophen **OR** acetaminophen, ondansetron **OR** ondansetron (ZOFRAN) IV, senna-docusate  Allergies: No Known Allergies  History reviewed. No pertinent family history.  Social History:  reports that he has quit smoking. His smoking use included cigarettes. He has a 10.00 pack-year smoking history. He has never used smokeless tobacco. He reports current alcohol use of about 12.0 standard drinks per week. He reports that he does not use drugs.  ROS: A complete review of systems was performed.  All systems are negative except for pertinent findings as noted.  Physical Exam:  Vital  signs in last 24 hours: Temp:  [96.3 F (35.7 C)-96.7 F (35.9 C)] 96.7 F (35.9 C) (12/07 1838) Pulse Rate:  [46-106] 106 (12/08 0015) Resp:  [16-26] 16 (12/07 2300) BP: (120-145)/(73-98) 143/93 (12/08 0015) SpO2:  [94 %-100 %] 100 % (12/08 0015) Weight:  [77.1 kg] 77.1 kg (12/07 1756) Constitutional:  Alert and oriented, No acute distress Cardiovascular: Regular rate and rhythm, No JVD Respiratory: Normal respiratory effort, Lungs clear bilaterally GI: Abdomen is soft, nontender,  nondistended, no abdominal masses GU: DRE with firm prostate with irregular contours, no discrete nodule. Patient did not tolerate DRE well. Normal external genitalia, circumcised phallus.  Lymphatic: No lymphadenopathy Neurologic: Grossly intact, no focal deficits Psychiatric: Normal mood and affect  Laboratory Data:  Recent Labs    09/25/21 1835  WBC 22.6*  HGB 10.6*  HCT 32.7*  PLT 180    Recent Labs    09/25/21 1835  NA 134*  K 5.0  CL 100  GLUCOSE 135*  BUN 115*  CALCIUM 8.4*  CREATININE 5.18*     Results for orders placed or performed during the hospital encounter of 09/25/21 (from the past 24 hour(s))  CBG monitoring, ED     Status: Abnormal   Collection Time: 09/25/21  6:15 PM  Result Value Ref Range   Glucose-Capillary 126 (H) 70 - 99 mg/dL  Brain natriuretic peptide     Status: Abnormal   Collection Time: 09/25/21  6:24 PM  Result Value Ref Range   B Natriuretic Peptide 3,827.9 (H) 0.0 - 100.0 pg/mL  Comprehensive metabolic panel     Status: Abnormal   Collection Time: 09/25/21  6:35 PM  Result Value Ref Range   Sodium 134 (L) 135 - 145 mmol/L   Potassium 5.0 3.5 - 5.1 mmol/L   Chloride 100 98 - 111 mmol/L   CO2 17 (L) 22 - 32 mmol/L   Glucose, Bld 135 (H) 70 - 99 mg/dL   BUN 115 (H) 8 - 23 mg/dL   Creatinine, Ser 5.18 (H) 0.61 - 1.24 mg/dL   Calcium 8.4 (L) 8.9 - 10.3 mg/dL   Total Protein 7.1 6.5 - 8.1 g/dL   Albumin 3.3 (L) 3.5 - 5.0 g/dL   AST 71 (H) 15 - 41 U/L   ALT 157 (H) 0 - 44 U/L   Alkaline Phosphatase 199 (H) 38 - 126 U/L   Total Bilirubin 1.3 (H) 0.3 - 1.2 mg/dL   GFR, Estimated 10 (L) >60 mL/min   Anion gap 17 (H) 5 - 15  Lactic acid, plasma     Status: None   Collection Time: 09/25/21  6:35 PM  Result Value Ref Range   Lactic Acid, Venous 1.5 0.5 - 1.9 mmol/L  CBC with Differential     Status: Abnormal   Collection Time: 09/25/21  6:35 PM  Result Value Ref Range   WBC 22.6 (H) 4.0 - 10.5 K/uL   RBC 3.08 (L) 4.22 - 5.81 MIL/uL    Hemoglobin 10.6 (L) 13.0 - 17.0 g/dL   HCT 32.7 (L) 39.0 - 52.0 %   MCV 106.2 (H) 80.0 - 100.0 fL   MCH 34.4 (H) 26.0 - 34.0 pg   MCHC 32.4 30.0 - 36.0 g/dL   RDW 14.6 11.5 - 15.5 %   Platelets 180 150 - 400 K/uL   nRBC 0.0 0.0 - 0.2 %   Neutrophils Relative % 90 %   Neutro Abs 20.3 (H) 1.7 - 7.7 K/uL   Lymphocytes Relative 2 %  Lymphs Abs 0.4 (L) 0.7 - 4.0 K/uL   Monocytes Relative 7 %   Monocytes Absolute 1.7 (H) 0.1 - 1.0 K/uL   Eosinophils Relative 0 %   Eosinophils Absolute 0.0 0.0 - 0.5 K/uL   Basophils Relative 0 %   Basophils Absolute 0.0 0.0 - 0.1 K/uL   Immature Granulocytes 1 %   Abs Immature Granulocytes 0.19 (H) 0.00 - 0.07 K/uL  Protime-INR     Status: Abnormal   Collection Time: 09/25/21  6:35 PM  Result Value Ref Range   Prothrombin Time 25.8 (H) 11.4 - 15.2 seconds   INR 2.4 (H) 0.8 - 1.2  Type and screen [place]     Status: None   Collection Time: 09/25/21  6:35 PM  Result Value Ref Range   ABO/RH(D) O POS    Antibody Screen NEG    Sample Expiration      09/28/2021,2359 Performed at [facility], [place] [address], [place], [place] [address]   Resp Panel by RT-PCR (Flu A&B, Covid) Peripheral     Status: None   Collection Time: 09/25/21  6:35 PM   Specimen: Peripheral; Nasopharyngeal(NP) swabs in vial transport medium  Result Value Ref Range   SARS Coronavirus 2 by RT PCR NEGATIVE NEGATIVE   Influenza A by PCR NEGATIVE NEGATIVE   Influenza B by PCR NEGATIVE NEGATIVE  Ethanol     Status: None   Collection Time: 09/25/21  6:35 PM  Result Value Ref Range   Alcohol, Ethyl (B) <10 <10 mg/dL  Urinalysis, Routine w reflex microscopic Urine, Clean Catch     Status: Abnormal   Collection Time: 09/25/21  8:11 PM  Result Value Ref Range   Color, Urine YELLOW YELLOW   APPearance CLEAR CLEAR   Specific Gravity, Urine 1.015 1.005 - 1.030   pH 5.5 5.0 - 8.0   Glucose, UA NEGATIVE NEGATIVE mg/dL   Hgb urine dipstick  NEGATIVE NEGATIVE   Bilirubin Urine NEGATIVE NEGATIVE   Ketones, ur NEGATIVE NEGATIVE mg/dL   Protein, ur NEGATIVE NEGATIVE mg/dL   Nitrite NEGATIVE NEGATIVE   Leukocytes,Ua TRACE (A) NEGATIVE   Bacteria, UA RARE (A) NONE SEEN   Mucus PRESENT   Sodium, urine, random     Status: None   Collection Time: 09/25/21  8:11 PM  Result Value Ref Range   Sodium, Ur <10 mmol/L  Creatinine, urine, random     Status: None   Collection Time: 09/25/21  8:11 PM  Result Value Ref Range   Creatinine, Urine 78.96 mg/dL   Recent Results (from the past 240 hour(s))  Resp Panel by RT-PCR (Flu A&B, Covid) Peripheral     Status: None   Collection Time: 09/25/21  6:35 PM   Specimen: Peripheral; Nasopharyngeal(NP) swabs in vial transport medium  Result Value Ref Range Status   SARS Coronavirus 2 by RT PCR NEGATIVE NEGATIVE Final    Comment: (NOTE) SARS-CoV-2 target nucleic acids are NOT DETECTED.  The SARS-CoV-2 RNA is generally detectable in upper respiratory specimens during the acute phase of infection. The lowest concentration of SARS-CoV-2 viral copies this assay can detect is 138 copies/mL. A negative result does not preclude SARS-Cov-2 infection and should not be used as the sole basis for treatment or other patient management decisions. A negative result may occur with  improper specimen collection/handling, submission of specimen other than nasopharyngeal swab, presence of viral mutation(s) within the areas targeted by this assay, and inadequate number of viral copies(<138 copies/mL). A  negative result must be combined with clinical observations, patient history, and epidemiological information. The expected result is Negative.  Fact Sheet for Patients:  EntrepreneurPulse.com.au  Fact Sheet for Healthcare Providers:  IncredibleEmployment.be  This test is no t yet approved or cleared by the [place] FDA and  has been authorized for detection and/or  diagnosis of SARS-CoV-2 by FDA under an Emergency Use Authorization (EUA). This EUA will remain  in effect (meaning this test can be used) for the duration of the COVID-19 declaration under Section 564(b)(1) of the Act, 21 U.S.C.section 360bbb-3(b)(1), unless the authorization is terminated  or revoked sooner.       Influenza A by PCR NEGATIVE NEGATIVE Final   Influenza B by PCR NEGATIVE NEGATIVE Final    Comment: (NOTE) The Xpert Xpress SARS-CoV-2/FLU/RSV plus assay is intended as an aid in the diagnosis of influenza from Nasopharyngeal swab specimens and should not be used as a sole basis for treatment. Nasal washings and aspirates are unacceptable for Xpert Xpress SARS-CoV-2/FLU/RSV testing.  Fact Sheet for Patients: EntrepreneurPulse.com.au  Fact Sheet for Healthcare Providers: IncredibleEmployment.be  This test is not yet approved or cleared by the [place] FDA and has been authorized for detection and/or diagnosis of SARS-CoV-2 by FDA under an Emergency Use Authorization (EUA). This EUA will remain in effect (meaning this test can be used) for the duration of the COVID-19 declaration under Section 564(b)(1) of the Act, 21 U.S.C. section 360bbb-3(b)(1), unless the authorization is terminated or revoked.  Performed at [facility], [place] [address], [place], [place] [address]     Renal Function: Recent Labs    09/25/21 1835  CREATININE 5.18*   Estimated Creatinine Clearance: 9.7 mL/min (A) (by C-G formula based on SCr of 5.18 mg/dL (H)).  Radiologic Imaging: DG Chest 1 View  Result Date: 09/25/2021 CLINICAL DATA:  Dyspnea EXAM: CHEST  1 VIEW COMPARISON:  03/21/2013 FINDINGS: Cardiomegaly with vascular congestion, pulmonary edema and bilateral effusions. Bibasilar consolidations. Aortic atherosclerosis. No pneumothorax. Possible tracheal deviation to the right. Multifocal osseous sclerosis is suspicious for  metastatic disease. IMPRESSION: 1. Cardiomegaly with vascular congestion, pulmonary edema and bilateral effusions. Bibasilar consolidations consistent with atelectasis or pneumonia 2. Multifocal osseous sclerosis, suspicious for metastatic disease. 3. Suspect tracheal deviation to the right, left paratracheal mass cannot be excluded. Suggest correlation with chest CT. Electronically Signed   By: Donavan Foil M.D.   On: 09/25/2021 20:03   DG Tibia/Fibula Right  Result Date: 09/25/2021 CLINICAL DATA:  Lower extremity wound EXAM: RIGHT TIBIA AND FIBULA - 2 VIEW COMPARISON:  None. FINDINGS: No fracture or malalignment. Diffuse soft tissue swelling. Vascular calcifications. No soft tissue emphysema IMPRESSION: No acute osseous abnormality Electronically Signed   By: Donavan Foil M.D.   On: 09/25/2021 20:05   CT CHEST WO CONTRAST  Result Date: 09/25/2021 CLINICAL DATA:  Evaluate pleural effusion EXAM: CT CHEST WITHOUT CONTRAST TECHNIQUE: Multidetector CT imaging of the chest was performed following the standard protocol without IV contrast. COMPARISON:  Chest x-ray from earlier in the same day FINDINGS: Cardiovascular: Atherosclerotic calcifications are noted without aneurysmal dilatation. Coronary calcifications are seen. No cardiac enlargement is noted. No pericardial effusion is seen. Pulmonary artery is not significantly enlarged. Mediastinum/Nodes: Thoracic inlet is within normal limits. No sizable hilar or mediastinal adenopathy is noted. The esophagus as visualized is within normal limits. Lungs/Pleura: Bilateral large pleural effusions are noted. Associated basilar atelectasis is noted. Lungs are otherwise well aerated without focal infiltrate or parenchymal nodules. Upper Abdomen:  Visualized upper abdomen demonstrates fullness of the renal pelves bilaterally of uncertain significance/chronicity. Musculoskeletal: Multiple sclerotic lesions are identified throughout the visualized bony skeleton consistent  with metastatic disease. This raises suspicion for prostate carcinoma. IMPRESSION: Changes consistent with bony metastatic disease likely related to prostate carcinoma. Further workup is recommended. Bilateral pleural effusions with lower lobe atelectasis. No focal mass lesion is noted. Fullness in the collecting systems of the kidneys bilaterally which may be related to bladder outlet obstruction but incompletely evaluated on this exam. Aortic Atherosclerosis (ICD10-I70.0). Electronically Signed   By: Inez Catalina M.D.   On: 09/25/2021 20:41   US RENAL  Result Date: 09/25/2021 CLINICAL DATA:  Acute renal failure EXAM: RENAL / URINARY TRACT ULTRASOUND COMPLETE COMPARISON:  None. FINDINGS: Right Kidney: Renal measurements: 11.5 x 4.7 x 4.2 cm. = volume: 18 mL. Significant hydronephrosis is noted. Continues into the proximal right ureter. Left Kidney: Renal measurements: 11.5 x 5.7 x 4.9 cm. = volume: 169 mL. Significant hydronephrosis is noted similar to that seen on the right. 1.5 cm cyst is noted inferiorly. Bladder: Bladder is well distended. Some mild irregularity of the posterior bladder wall is noted without discrete mass. May represent increased trabeculation possibly related to bladder outlet obstruction. Distal ureters appear dilated. Note is made of a left-sided pleural effusion. Other: None. IMPRESSION: Significant hydronephrosis bilaterally which extends to the level of the urinary bladder. Findings suggestive of bladder outlet obstruction are noted. Repeat imaging following decompression of the bladder may be helpful as clinically indicated. Left pleural effusion. Left renal cyst. Electronically Signed   By: Inez Catalina M.D.   On: 09/25/2021 22:20   DG Foot Complete Right  Result Date: 09/25/2021 CLINICAL DATA:  Right lower extremity wound infection EXAM: RIGHT FOOT COMPLETE - 3+ VIEW COMPARISON:  None. FINDINGS: No fracture or malalignment. 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 Diffuse  soft tissue swelling without emphysema. Vascular calcifications. Small plantar calcaneal spur. IMPRESSION: No acute osseous abnormality. Electronically Signed   By: Donavan Foil M.D.   On: 09/25/2021 20:04   US Abdomen Limited RUQ (LIVER/GB)  Result Date: 09/25/2021 CLINICAL DATA:  Elevated LFT EXAM: ULTRASOUND ABDOMEN LIMITED RIGHT UPPER QUADRANT COMPARISON:  None. FINDINGS: Gallbladder: No gallstones or wall thickening visualized. No sonographic Murphy sign noted by sonographer. Common bile duct: Diameter: 4 mm Liver: No focal lesion identified. Within normal limits in parenchymal echogenicity. Portal vein is patent on color Doppler imaging with normal direction of blood flow towards the liver. Other: Right pleural effusion. Incompletely evaluated right hydronephrosis IMPRESSION: 1. Ultrasound appearance of the liver is within normal limits. 2. Right pleural effusion 3. Incompletely visualized right hydronephrosis Electronically Signed   By: Donavan Foil M.D.   On: 09/25/2021 22:23    I independently reviewed the above imaging studies.  Impression/Recommendation 87 y/o male admitted for cellulitis of RLE found to be in renal failure and urinary retention. CT chest with bony metastasis concerning for prostate cancer with metastasis.   #Urinary retention -Suspect long standing bladder outlet obstruction based on ultrasound imaging  -14 Fr coude catheter placed (see separate procedure note) with >500cc out on placement  -Monitor for post obstructive diuresis  #Renal failure with BL hydronephrosis -Creatinine 5.18 on admission (0.7 in 2019) likely due to bladder outlet obstructing -Anticipate creatinine and hydro with improve with foley decompression  #Abnormal imaging concerning for bony metastatic disease -Bony mets and DRE concerning for prostate cancer primary  -Please obtain PSA -Patient will likely need axial imaging of  abd/pelvis to assess prostate and degree of intra-abdominal disease   -Will need tissue diagnosis with biopsy of prostate or metastatic lesion pending further workup  Aldine Contes 09/26/2021, 1:23 AM   I have spoken with the patient's daughter, Daniel Andersen.  I have also reviewed the case with Dr. Lahoma Crocker.  Patient has picture of metastatic prostate cancer.  PSA is approximately 250.  With the PSA this high as well as obvious osseous metastatic disease, I think it is worthwhile to start the patient on androgen deprivation therapy now to speed his improvement.  We can perform a confirmatory ultrasound and biopsy in the near future, on an outpatient basis.  I agree with the CT scan to rule out obstructive adenopathy.  I will write an order for Firmagon 240 mg to be given in the near future.  I agree with the above plan.

## 2021-09-26 NOTE — ED Notes (Signed)
Initial urine output in foley drainage bag was yellow/clear and now is frankly bloody, no clots noted at this time. Admitting MD notified of change in urine output

## 2021-09-26 NOTE — Consult Note (Addendum)
[facility] Nurse Consult Note: Reason for Consult: Consult requested for RLE.  Performed remotely after review of progress notes and photos in the chart.  Wound type: Right leg with cellulitis and generalized edema and erythremia.  There are some large intact yellow fluid filled bulla to the foot, and other areas the blisters have ruptured and evolved into partial thickness loss with red moist wounds to the anterior foot and large amt yellow drainage. Pt is currently on systemic antibiotics for cellulitis. Dressing procedure/placement/frequency: Topical treatment orders provided for bedside nurses to perform as follows to promote drying and healing: Apply xeroform gauze to blistered and open areas on RLE Q day, then cover with ABD pads and kerlex. Please re-consult if further assistance is needed.  Thank-you,  Julien Girt MSN, [place], [place], [place], [place]

## 2021-09-26 NOTE — Procedures (Signed)
Foley Catheter Placement Note  Indications: 87 y.o. male with urinary retention and RUS with bilateral hydro concerning for bladder outlet obstruction, nursing unable to place foley   Pre-operative Diagnosis: Urinary retention, renal failure, hydronephrosis  Post-operative Diagnosis: Same  Surgeon: Aldine Contes, MD  Assistants: None  Procedure Details  Patient was placed in the supine position, prepped with Betadine and draped in the usual sterile fashion.  We injected lidocaine jelly per urethra prior to the procedure.  We then inserted a 14 [place] coude catheter per urethra which easily passed into the bladder without any resistance at the prostatic urethra.  We achieved return of clear yellow urine and then proceeded to insert 10 mL of sterile water into the Foley balloon.  The catheter was attached to a drainage bag and secured with a StatLock.  Placement of the catheter had return of greater than 500 mL of yellow urine.               Complications: None; patient tolerated the procedure well.  Plan:   1.  See full consult note  Attending Attestation: Dr. Diona Fanti was available.

## 2021-09-26 NOTE — Consult Note (Signed)
CONSULTATION NOTE   Patient Name: Daniel Andersen Date of Encounter: 09/26/2021 Cardiologist: None Electrophysiologist: None Advanced Heart Failure: None   Chief Complaint   Edema, right leg infection  Patient Profile   87 yo male presented with soft tissue infection and lower extremity edema, found to have high BNP of 3827 and echo showed LVEF of 25-30%  HPI   Daniel Andersen is a 87 y.o. male who is being seen today for the evaluation of CHF at the request of Dr. [place] This is an 87 year old male with a history of hypertension and alcohol use who has reportedly had worsening lower extremity edema for about 2 weeks starting around Thanksgiving as well as redness of the right lower extremity and large bullae that developed.  He does note some worsening shortness of breath.  Chest x-ray shows cardiomegaly and vascular congestion with pulmonary edema and bilateral pleural effusions.  BNP initially was very high at 3827 consistent with congestive heart failure.  Additional labs showed a markedly elevated creatinine at 5.18, previously 0.76, bicarb of 17, white count of 22,600, hemoglobin low at 10.6 and elevated lactic acid of 1.5.  CT of the chest shows some changes consistent with possible bony metastatic disease, possibly related to a prostate cancer.  PSA significantly elevated at 246.  Coronary calcification is noted as well as aortic atherosclerosis.  Renal ultrasound performed shows changes suggestive of bladder outlet obstruction.  Right hydronephrosis was noted.  Cardiology is asked to evaluate and manage heart failure.  Of note, urology was involved and placed a coud catheter.  After relieving outlet obstruction, the creatinine is already improving now down to 4.93 from 5.18.  PMHx   Past Medical History:  Diagnosis Date   Hypertension    Low bone mass 03/2013   Bone Density scan   Obesity    Shingles 2005   Wears glasses    Wears partial dentures     Past Surgical  History:  Procedure Laterality Date   COLONOSCOPY W/ BIOPSIES AND POLYPECTOMY  2010   Benign polyps, repeat in 5 years   INGUINAL HERNIA REPAIR Right 11/2004   QUADRICEPS TENDON REPAIR Right 05/19/2017   Procedure: REPAIR QUADRICEP TENDON RIGHT;  Surgeon: Renette Butters, MD;  Location: [facility];  Service: Orthopedics;  Laterality: Right;   TONSILLECTOMY      FAMHx   History reviewed. No pertinent family history.  SOCHx    reports that he has quit smoking. His smoking use included cigarettes. He has a 10.00 pack-year smoking history. He has never used smokeless tobacco. He reports current alcohol use of about 12.0 standard drinks per week. He reports that he does not use drugs.  Outpatient Medications   No current facility-administered medications on file prior to encounter.   Current Outpatient Medications on File Prior to Encounter  Medication Sig Dispense Refill   aspirin EC 81 MG tablet Take 81 mg by mouth daily. Swallow whole.     calcium carbonate (CALCIUM 600) 600 MG TABS tablet Take 600 mg by mouth daily with breakfast.     Cholecalciferol (VITAMIN D) 1000 UNITS capsule Take 1,000 Units by mouth daily.      fish oil-omega-3 fatty acids 1000 MG capsule Take 1 g by mouth daily.      ibuprofen (ADVIL) 200 MG tablet Take 200 mg by mouth every 6 (six) hours as needed for mild pain.     Multiple Vitamins-Minerals (CENTRUM SILVER ULTRA MENS) TABS Take 1 tablet by mouth  daily.       OVER THE COUNTER MEDICATION Take 3 tablets by mouth daily. Force Factor Prostate Advanced tab     oxymetazoline (AFRIN) 0.05 % nasal spray Place 1 spray into both nostrils 2 (two) times daily as needed for congestion.     alendronate (FOSAMAX) 70 MG tablet TAKE 1 TABLET BY MOUTH ONCE A WEEK WITH A FULL GLASS OF WATER ON AN EMPTY STOMACH 12 tablet 3   amLODipine (NORVASC) 5 MG tablet TAKE 1 TABLET BY MOUTH EVERY DAY 30 tablet 0   bisoprolol-hydrochlorothiazide (ZIAC) 5-6.25 MG tablet TAKE 1 TABLET BY MOUTH  EVERY DAY 30 tablet 0    Inpatient Medications    Scheduled Meds:  furosemide  40 mg Intravenous Q12H   heparin  5,000 Units Subcutaneous Q8H   metoprolol tartrate  25 mg Oral BID   sodium chloride flush  3 mL Intravenous Q12H   vancomycin variable dose per unstable renal function (pharmacist dosing)   Does not apply See admin instructions    Continuous Infusions:  ceFEPime (MAXIPIME) IV      PRN Meds: acetaminophen **OR** acetaminophen, melatonin, ondansetron **OR** ondansetron (ZOFRAN) IV, senna-docusate   ALLERGIES   No Known Allergies  ROS   Pertinent items noted in HPI and remainder of comprehensive ROS otherwise negative.  Vitals   Vitals:   09/26/21 1005 09/26/21 1015 09/26/21 1030 09/26/21 1100  BP:   100/84 (!) 88/56  Pulse: (!) 145 (!) 136 60 78  Resp: 19 (!) 26 (!) 23 18  Temp:      TempSrc:      SpO2: 98% 98% 98% 97%  Weight:      Height:        Intake/Output Summary (Last 24 hours) at 09/26/2021 1131 Last data filed at 09/25/2021 2251 Gross per 24 hour  Intake 1200 ml  Output --  Net 1200 ml   Filed Weights   09/25/21 1756  Weight: 77.1 kg    Physical Exam   General appearance: alert, no distress, and pale Neck: JVD - several cm above sternal notch, no carotid bruit, and thyroid not enlarged, symmetric, no tenderness/mass/nodules Lungs: diminished breath sounds bibasilar Heart: irregularly irregular rhythm Abdomen: soft, non-tender; bowel sounds normal; no masses,  no organomegaly Extremities: edema 2-3+ bilateral edema with right lower extremity erythema and bullae Pulses: Faint distal pulses Skin: Pale, warm, dry Neurologic: Mental status: Alert, oriented, thought content appropriate Psych: [place]  Labs   Results for orders placed or performed during the hospital encounter of 09/25/21 (from the past 48 hour(s))  CBG monitoring, ED     Status: Abnormal   Collection Time: 09/25/21  6:15 PM  Result Value Ref Range    Glucose-Capillary 126 (H) 70 - 99 mg/dL    Comment: Glucose reference range applies only to samples taken after fasting for at least 8 hours.  Brain natriuretic peptide     Status: Abnormal   Collection Time: 09/25/21  6:24 PM  Result Value Ref Range   B Natriuretic Peptide 3,827.9 (H) 0.0 - 100.0 pg/mL    Comment: Performed at [facility], [place] [address], [place], [place] [address]  Culture, blood (routine x 2)     Status: None (Preliminary result)   Collection Time: 09/25/21  6:35 PM   Specimen: BLOOD  Result Value Ref Range   Specimen Description      BLOOD SITE NOT SPECIFIED Performed at [facility] [address], [place], [place] [address]  Special Requests      BOTTLES DRAWN AEROBIC AND ANAEROBIC Blood Culture adequate volume Performed at [facility] [address], [place], [place] [address]    Culture      NO GROWTH < 12 HOURS Performed at [facility] [address] [place], [place] [address]    Report Status PENDING   Culture, blood (routine x 2)     Status: None (Preliminary result)   Collection Time: 09/25/21  6:35 PM   Specimen: BLOOD  Result Value Ref Range   Specimen Description      BLOOD BLOOD LEFT HAND Performed at [facility] [address], [place], [place] [address]    Special Requests      BOTTLES DRAWN AEROBIC AND ANAEROBIC Blood Culture results may not be optimal due to an inadequate volume of blood received in culture bottles Performed at [facility], [place] [address] [place], [place] [address]    Culture      NO GROWTH < 12 HOURS Performed at [facility] [address] [place], [place] [address]    Report Status PENDING   Comprehensive metabolic panel     Status: Abnormal   Collection Time: 09/25/21  6:35 PM  Result Value Ref Range   Sodium 134 (L) 135 - 145 mmol/L   Potassium 5.0 3.5 - 5.1 mmol/L   Chloride 100 98 - 111  mmol/L   CO2 17 (L) 22 - 32 mmol/L   Glucose, Bld 135 (H) 70 - 99 mg/dL    Comment: Glucose reference range applies only to samples taken after fasting for at least 8 hours.   BUN 115 (H) 8 - 23 mg/dL    Comment: RESULTS CONFIRMED BY MANUAL DILUTION   Creatinine, Ser 5.18 (H) 0.61 - 1.24 mg/dL   Calcium 8.4 (L) 8.9 - 10.3 mg/dL   Total Protein 7.1 6.5 - 8.1 g/dL   Albumin 3.3 (L) 3.5 - 5.0 g/dL   AST 71 (H) 15 - 41 U/L   ALT 157 (H) 0 - 44 U/L   Alkaline Phosphatase 199 (H) 38 - 126 U/L   Total Bilirubin 1.3 (H) 0.3 - 1.2 mg/dL   GFR, Estimated 10 (L) >60 mL/min    Comment: (NOTE) Calculated using the CKD-EPI Creatinine Equation (2021)    Anion gap 17 (H) 5 - 15    Comment: Performed at [facility], [place] [address] [place], [place] [address]  Lactic acid, plasma     Status: None   Collection Time: 09/25/21  6:35 PM  Result Value Ref Range   Lactic Acid, Venous 1.5 0.5 - 1.9 mmol/L    Comment: Performed at [facility], [place] [address] [place],  [address]  CBC with Differential     Status: Abnormal   Collection Time: 09/25/21  6:35 PM  Result Value Ref Range   WBC 22.6 (H) 4.0 - 10.5 K/uL   RBC 3.08 (L) 4.22 - 5.81 MIL/uL   Hemoglobin 10.6 (L) 13.0 - 17.0 g/dL   HCT 32.7 (L) 39.0 - 52.0 %   MCV 106.2 (H) 80.0 - 100.0 fL   MCH 34.4 (H) 26.0 - 34.0 pg   MCHC 32.4 30.0 - 36.0 g/dL   RDW 14.6 11.5 - 15.5 %   Platelets 180 150 - 400 K/uL   nRBC 0.0 0.0 - 0.2 %   Neutrophils Relative % 90 %   Neutro Abs 20.3 (H) 1.7 -  7.7 K/uL   Lymphocytes Relative 2 %   Lymphs Abs 0.4 (L) 0.7 - 4.0 K/uL   Monocytes Relative 7 %   Monocytes Absolute 1.7 (H) 0.1 - 1.0 K/uL   Eosinophils Relative 0 %   Eosinophils Absolute 0.0 0.0 - 0.5 K/uL   Basophils Relative 0 %   Basophils Absolute 0.0 0.0 - 0.1 K/uL   Immature Granulocytes 1 %   Abs Immature Granulocytes 0.19 (H) 0.00 - 0.07 K/uL    Comment: Performed at [facility], [place] [address], [place], [place] [address]  Protime-INR     Status: Abnormal   Collection Time: 09/25/21  6:35 PM  Result Value Ref Range   Prothrombin Time 25.8 (H) 11.4 - 15.2 seconds   INR 2.4 (H) 0.8 - 1.2    Comment: (NOTE) INR goal varies based on device and disease states. Performed at [facility], [place] [address], [place], [place] [address]   Type and screen [place]     Status: None   Collection Time: 09/25/21  6:35 PM  Result Value Ref Range   ABO/RH(D) O POS    Antibody Screen NEG    Sample Expiration      09/28/2021,2359 Performed at [facility], [place] [address], [place], [place] [address]   Resp Panel by RT-PCR (Flu A&B, Covid) Peripheral     Status: None   Collection Time: 09/25/21  6:35 PM   Specimen: Peripheral; Nasopharyngeal(NP) swabs in vial transport medium  Result Value Ref Range   SARS Coronavirus 2 by RT PCR NEGATIVE NEGATIVE    Comment: (NOTE) SARS-CoV-2 target nucleic acids are NOT DETECTED.  The SARS-CoV-2 RNA is generally detectable in upper respiratory specimens during the acute phase of infection. The lowest concentration of SARS-CoV-2 viral copies this assay can detect is 138 copies/mL. A negative result does not preclude SARS-Cov-2 infection and should not be used as the sole basis for treatment or other patient management decisions. A negative result may occur with  improper specimen collection/handling, submission of specimen other than nasopharyngeal swab, presence of viral mutation(s) within the areas targeted by this assay, and inadequate number of viral copies(<138 copies/mL). A negative result must be combined with clinical observations, patient history, and epidemiological information. The expected result is Negative.  Fact Sheet for Patients:  EntrepreneurPulse.com.au  Fact Sheet for Healthcare Providers:  IncredibleEmployment.be  This test  is no t yet approved or cleared by the [place] FDA and  has been authorized for detection and/or diagnosis of SARS-CoV-2 by FDA under an Emergency Use Authorization (EUA). This EUA will remain  in effect (meaning this test can be used) for the duration of the COVID-19 declaration under Section 564(b)(1) of the Act, 21 U.S.C.section 360bbb-3(b)(1), unless the authorization is terminated  or revoked sooner.       Influenza A by PCR NEGATIVE NEGATIVE   Influenza B by PCR NEGATIVE NEGATIVE    Comment: (NOTE) The Xpert Xpress SARS-CoV-2/FLU/RSV plus assay is intended as an aid in the diagnosis of influenza from Nasopharyngeal swab specimens and should not be used as a sole basis for treatment. Nasal washings and aspirates are unacceptable for Xpert Xpress SARS-CoV-2/FLU/RSV testing.  Fact Sheet for Patients: EntrepreneurPulse.com.au  Fact Sheet for Healthcare Providers: IncredibleEmployment.be  This test is not yet approved or cleared by the [place] FDA and has been authorized for detection and/or diagnosis of SARS-CoV-2 by FDA under an Emergency Use Authorization (EUA). This EUA  will remain in effect (meaning this test can be used) for the duration of the COVID-19 declaration under Section 564(b)(1) of the Act, 21 U.S.C. section 360bbb-3(b)(1), unless the authorization is terminated or revoked.  Performed at [facility], [place] [address] [place], [place] [address]   Ethanol     Status: None   Collection Time: 09/25/21  6:35 PM  Result Value Ref Range   Alcohol, Ethyl (B) <10 <10 mg/dL    Comment: (NOTE) Lowest detectable limit for serum alcohol is 10 mg/dL.  For medical purposes only. Performed at [facility], [place] [address], [place], [place] [address]   Urinalysis, Routine w reflex microscopic Urine, Clean Catch     Status: Abnormal   Collection Time: 09/25/21  8:11 PM  Result Value Ref  Range   Color, Urine YELLOW YELLOW   APPearance CLEAR CLEAR   Specific Gravity, Urine 1.015 1.005 - 1.030   pH 5.5 5.0 - 8.0   Glucose, UA NEGATIVE NEGATIVE mg/dL   Hgb urine dipstick NEGATIVE NEGATIVE   Bilirubin Urine NEGATIVE NEGATIVE   Ketones, ur NEGATIVE NEGATIVE mg/dL   Protein, ur NEGATIVE NEGATIVE mg/dL   Nitrite NEGATIVE NEGATIVE   Leukocytes,Ua TRACE (A) NEGATIVE   Bacteria, UA RARE (A) NONE SEEN   Mucus PRESENT     Comment: Performed at [facility], [place] [address] [place], [place] [address]  Sodium, urine, random     Status: None   Collection Time: 09/25/21  8:11 PM  Result Value Ref Range   Sodium, Ur <10 mmol/L    Comment: Performed at [facility], [place] [address] [place], [place] [address]  Creatinine, urine, random     Status: None   Collection Time: 09/25/21  8:11 PM  Result Value Ref Range   Creatinine, Urine 78.96 mg/dL    Comment: Performed at [facility], [place] [address], [place], [place] [address]  Protime-INR     Status: Abnormal   Collection Time: 09/26/21  6:05 AM  Result Value Ref Range   Prothrombin Time 23.4 (H) 11.4 - 15.2 seconds   INR 2.1 (H) 0.8 - 1.2    Comment: (NOTE) INR goal varies based on device and disease states. Performed at [facility], [place] [address] [place],  [address]   Procalcitonin     Status: None   Collection Time: 09/26/21  6:05 AM  Result Value Ref Range   Procalcitonin 6.98 ng/mL    Comment:        Interpretation: PCT > 2 ng/mL: Systemic infection (sepsis) is likely, unless other causes are known. (NOTE)       Sepsis PCT Algorithm           Lower Respiratory Tract                                      Infection PCT Algorithm    ----------------------------     ----------------------------         PCT < 0.25 ng/mL                PCT < 0.10 ng/mL          Strongly encourage             Strongly discourage   discontinuation of  antibiotics    initiation of antibiotics    ----------------------------     -----------------------------  PCT 0.25 - 0.50 ng/mL            PCT 0.10 - 0.25 ng/mL               OR       >80% decrease in PCT            Discourage initiation of                                            antibiotics      Encourage discontinuation           of antibiotics    ----------------------------     -----------------------------         PCT >= 0.50 ng/mL              PCT 0.26 - 0.50 ng/mL               AND       <80% decrease in PCT              Encourage initiation of                                             antibiotics       Encourage continuation           of antibiotics    ----------------------------     -----------------------------        PCT >= 0.50 ng/mL                  PCT > 0.50 ng/mL               AND         increase in PCT                  Strongly encourage                                      initiation of antibiotics    Strongly encourage escalation           of antibiotics                                     -----------------------------                                           PCT <= 0.25 ng/mL                                                 OR                                        > 80% decrease in PCT  Discontinue / Do not initiate                                             antibiotics  Performed at [facility] [address], [place], [place] [address]   Magnesium     Status: None   Collection Time: 09/26/21  6:05 AM  Result Value Ref Range   Magnesium 2.2 1.7 - 2.4 mg/dL    Comment: Performed at [facility], [place] [address], [place], [place] [address]  Comprehensive metabolic panel     Status: Abnormal   Collection Time: 09/26/21  6:05 AM  Result Value Ref Range   Sodium 134 (L) 135 - 145 mmol/L   Potassium 4.4 3.5 - 5.1 mmol/L   Chloride 103 98 - 111 mmol/L   CO2 15 (L) 22 -  32 mmol/L   Glucose, Bld 108 (H) 70 - 99 mg/dL    Comment: Glucose reference range applies only to samples taken after fasting for at least 8 hours.   BUN 128 (H) 8 - 23 mg/dL    Comment: RESULTS CONFIRMED BY MANUAL DILUTION   Creatinine, Ser 4.93 (H) 0.61 - 1.24 mg/dL   Calcium 8.2 (L) 8.9 - 10.3 mg/dL   Total Protein 6.3 (L) 6.5 - 8.1 g/dL   Albumin 2.6 (L) 3.5 - 5.0 g/dL   AST 56 (H) 15 - 41 U/L   ALT 128 (H) 0 - 44 U/L   Alkaline Phosphatase 171 (H) 38 - 126 U/L   Total Bilirubin 1.2 0.3 - 1.2 mg/dL   GFR, Estimated 11 (L) >60 mL/min    Comment: (NOTE) Calculated using the CKD-EPI Creatinine Equation (2021)    Anion gap 16 (H) 5 - 15    Comment: Performed at [facility], [place] [address], [place], [place] [address]  Vitamin B12     Status: Abnormal   Collection Time: 09/26/21  6:05 AM  Result Value Ref Range   Vitamin B-12 1,404 (H) 180 - 914 pg/mL    Comment: RESULTS CONFIRMED BY MANUAL DILUTION (NOTE) This assay is not validated for testing neonatal or myeloproliferative syndrome specimens for Vitamin B12 levels. Performed at [facility], [place] [address], [place], [place] [address]   Folate     Status: None   Collection Time: 09/26/21  6:05 AM  Result Value Ref Range   Folate 32.3 >5.9 ng/mL    Comment: RESULTS CONFIRMED BY MANUAL DILUTION Performed at [facility] [address] [place], [place] [address]   Iron and TIBC     Status: Abnormal   Collection Time: 09/26/21  6:05 AM  Result Value Ref Range   Iron 15 (L) 45 - 182 ug/dL   TIBC 176 (L) 250 - 450 ug/dL   Saturation Ratios 9 (L) 17.9 - 39.5 %   UIBC 161 ug/dL    Comment: Performed at [facility], [place] [address], [place], [place] [address]  Ferritin     Status: None   Collection Time: 09/26/21  6:05 AM  Result Value Ref Range   Ferritin 322 24 - 336 ng/mL    Comment: Performed at [facility], [place] [address] [place],  [address]  PSA     Status: Abnormal   Collection Time: 09/26/21  6:05 AM  Result Value Ref  Range   Prostatic Specific Antigen 246.08 (H) 0.00 - 4.00 ng/mL    Comment: RESULTS CONFIRMED BY MANUAL DILUTION (NOTE) While PSA levels of <=4.0 ng/ml are reported as reference range, some men with levels below 4.0 ng/ml can have prostate cancer and many men with PSA above 4.0 ng/ml do not have prostate cancer.  Other tests such as free PSA, age specific reference ranges, PSA velocity and PSA doubling time may be helpful especially in men less than 80 years old. Performed at [facility], [place] [address] [place], [place] [address]     ECG   Sinus tachycardia with marked sinus arrhythmia- Personally Reviewed  Telemetry   Sinus tachycardia with marked sinus arrhythmia- Personally Reviewed  Radiology   DG Chest 1 View  Result Date: 09/25/2021 CLINICAL DATA:  Dyspnea EXAM: CHEST  1 VIEW COMPARISON:  03/21/2013 FINDINGS: Cardiomegaly with vascular congestion, pulmonary edema and bilateral effusions. Bibasilar consolidations. Aortic atherosclerosis. No pneumothorax. Possible tracheal deviation to the right. Multifocal osseous sclerosis is suspicious for metastatic disease. IMPRESSION: 1. Cardiomegaly with vascular congestion, pulmonary edema and bilateral effusions. Bibasilar consolidations consistent with atelectasis or pneumonia 2. Multifocal osseous sclerosis, suspicious for metastatic disease. 3. Suspect tracheal deviation to the right, left paratracheal mass cannot be excluded. Suggest correlation with chest CT. Electronically Signed   By: Donavan Foil M.D.   On: 09/25/2021 20:03   DG Tibia/Fibula Right  Result Date: 09/25/2021 CLINICAL DATA:  Lower extremity wound EXAM: RIGHT TIBIA AND FIBULA - 2 VIEW COMPARISON:  None. FINDINGS: No fracture or malalignment. Diffuse soft tissue swelling. Vascular calcifications. No soft tissue emphysema IMPRESSION: No  acute osseous abnormality Electronically Signed   By: Donavan Foil M.D.   On: 09/25/2021 20:05   CT CHEST WO CONTRAST  Result Date: 09/25/2021 CLINICAL DATA:  Evaluate pleural effusion EXAM: CT CHEST WITHOUT CONTRAST TECHNIQUE: Multidetector CT imaging of the chest was performed following the standard protocol without IV contrast. COMPARISON:  Chest x-ray from earlier in the same day FINDINGS: Cardiovascular: Atherosclerotic calcifications are noted without aneurysmal dilatation. Coronary calcifications are seen. No cardiac enlargement is noted. No pericardial effusion is seen. Pulmonary artery is not significantly enlarged. Mediastinum/Nodes: Thoracic inlet is within normal limits. No sizable hilar or mediastinal adenopathy is noted. The esophagus as visualized is within normal limits. Lungs/Pleura: Bilateral large pleural effusions are noted. Associated basilar atelectasis is noted. Lungs are otherwise well aerated without focal infiltrate or parenchymal nodules. Upper Abdomen: Visualized upper abdomen demonstrates fullness of the renal pelves bilaterally of uncertain significance/chronicity. Musculoskeletal: Multiple sclerotic lesions are identified throughout the visualized bony skeleton consistent with metastatic disease. This raises suspicion for prostate carcinoma. IMPRESSION: Changes consistent with bony metastatic disease likely related to prostate carcinoma. Further workup is recommended. Bilateral pleural effusions with lower lobe atelectasis. No focal mass lesion is noted. Fullness in the collecting systems of the kidneys bilaterally which may be related to bladder outlet obstruction but incompletely evaluated on this exam. Aortic Atherosclerosis (ICD10-I70.0). Electronically Signed   By: Inez Catalina M.D.   On: 09/25/2021 20:41   US RENAL  Result Date: 09/25/2021 CLINICAL DATA:  Acute renal failure EXAM: RENAL / URINARY TRACT ULTRASOUND COMPLETE COMPARISON:  None. FINDINGS: Right Kidney: Renal  measurements: 11.5 x 4.7 x 4.2 cm. = volume: 18 mL. Significant hydronephrosis is noted. Continues into the proximal right ureter. Left Kidney: Renal measurements: 11.5 x 5.7 x 4.9 cm. = volume: 169 mL. Significant hydronephrosis is noted similar to that seen on the right.  1.5 cm cyst is noted inferiorly. Bladder: Bladder is well distended. Some mild irregularity of the posterior bladder wall is noted without discrete mass. May represent increased trabeculation possibly related to bladder outlet obstruction. Distal ureters appear dilated. Note is made of a left-sided pleural effusion. Other: None. IMPRESSION: Significant hydronephrosis bilaterally which extends to the level of the urinary bladder. Findings suggestive of bladder outlet obstruction are noted. Repeat imaging following decompression of the bladder may be helpful as clinically indicated. Left pleural effusion. Left renal cyst. Electronically Signed   By: Inez Catalina M.D.   On: 09/25/2021 22:20   DG Foot Complete Right  Result Date: 09/25/2021 CLINICAL DATA:  Right lower extremity wound infection EXAM: RIGHT FOOT COMPLETE - 3+ VIEW COMPARISON:  None. FINDINGS: No fracture or malalignment. 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 Diffuse soft tissue swelling without emphysema. Vascular calcifications. Small plantar calcaneal spur. IMPRESSION: No acute osseous abnormality. Electronically Signed   By: Donavan Foil M.D.   On: 09/25/2021 20:04   ECHOCARDIOGRAM COMPLETE  Result Date: 09/26/2021    ECHOCARDIOGRAM REPORT   Patient Name:   Daniel Andersen Date of Exam: 09/26/2021 Medical Rec #:  [id number]       Height:       68.0 in Accession #:    [id number]      Weight:       170.0 lb Date of Birth:  [birth year]       BSA:          1.907 m Patient Age:    44 years        BP:           122/72 mmHg Patient Gender: M               HR:           141 bpm. Exam Location:  Inpatient Procedure: 2D Echo, Color Doppler, Cardiac Doppler and Intracardiac             Opacification Agent Indications:     CHF  History:         Patient has no prior history of Echocardiogram examinations.                  Risk Factors:Hypertension.  Sonographer:     Jyl Heinz Referring Phys:  [id number] Cleaster Corin PATEL Diagnosing Phys: Gwyndolyn Kaufman MD IMPRESSIONS  1. Left ventricular ejection fraction, by estimation, is 25 to 30%. The left ventricle has severely decreased function. The left ventricle demonstrates global hypokinesis. There is mild concentric left ventricular hypertrophy. Left ventricular diastolic  parameters are indeterminate. Elevated left atrial pressure.  2. Right ventricular systolic function is severely reduced. The right ventricular size is normal.  3. Left atrial size was mildly dilated.  4. Moderate pleural effusion in the left lateral region.  5. The mitral valve is degenerative. Trivial mitral valve regurgitation. Moderate mitral annular calcification.  6. The aortic valve is tricuspid. There is mild calcification of the aortic valve. There is mild thickening of the aortic valve. Aortic valve regurgitation is trivial. Aortic valve sclerosis is present, with no evidence of aortic valve stenosis.  7. The inferior vena cava is dilated in size with <50% respiratory variability, suggesting right atrial pressure of 15 mmHg.  8. Tachycardic during study with HR 130s with no discernable p-waves seen (possible Afib) Comparison(s): No prior Echocardiogram. FINDINGS  Left Ventricle: Left ventricular ejection fraction, by estimation, is 25 to 30%. The left ventricle has severely  decreased function. The left ventricle demonstrates global hypokinesis. The left ventricular internal cavity size was normal in size. There is mild concentric left ventricular hypertrophy. Left ventricular diastolic parameters are indeterminate. Elevated left atrial pressure. Right Ventricle: The right ventricular size is normal. No increase in right ventricular wall thickness. Right ventricular systolic  function is severely reduced. Left Atrium: Left atrial size was mildly dilated. Right Atrium: Right atrial size was normal in size. Pericardium: There is no evidence of pericardial effusion. Mitral Valve: The mitral valve is degenerative in appearance. There is mild thickening of the mitral valve leaflet(s). There is mild calcification of the mitral valve leaflet(s). Moderate mitral annular calcification. Trivial mitral valve regurgitation. Tricuspid Valve: The tricuspid valve is normal in structure. Tricuspid valve regurgitation is trivial. Aortic Valve: The aortic valve is tricuspid. There is mild calcification of the aortic valve. There is mild thickening of the aortic valve. Aortic valve regurgitation is trivial. Aortic valve sclerosis is present, with no evidence of aortic valve stenosis. Aortic valve peak gradient measures 5.6 mmHg. Pulmonic Valve: The pulmonic valve was normal in structure. Pulmonic valve regurgitation is trivial. Aorta: The aortic root and ascending aorta are structurally normal, with no evidence of dilitation. Venous: The inferior vena cava is dilated in size with less than 50% respiratory variability, suggesting right atrial pressure of 15 mmHg. IAS/Shunts: No atrial level shunt detected by color flow Doppler. Additional Comments: There is a moderate pleural effusion in the left lateral region.  LEFT VENTRICLE PLAX 2D LVIDd:         4.90 cm      Diastology LVIDs:         4.00 cm      LV e' medial:    5.26 cm/s LV PW:         1.10 cm      LV E/e' medial:  20.2 LV IVS:        1.10 cm      LV e' lateral:   7.07 cm/s LVOT diam:     2.00 cm      LV E/e' lateral: 15.0 LV SV:         30 LV SV Index:   16 LVOT Area:     3.14 cm  LV Volumes (MOD) LV vol d, MOD A2C: 140.0 ml LV vol d, MOD A4C: 135.0 ml LV vol s, MOD A2C: 101.0 ml LV vol s, MOD A4C: 89.8 ml LV SV MOD A2C:     39.0 ml LV SV MOD A4C:     135.0 ml LV SV MOD BP:      44.9 ml RIGHT VENTRICLE            IVC RV Basal diam:  3.60 cm    IVC  diam: 2.20 cm RV Mid diam:    3.10 cm RV S prime:     6.92 cm/s TAPSE (M-mode): 0.7 cm LEFT ATRIUM             Index        RIGHT ATRIUM           Index LA diam:        4.10 cm 2.15 cm/m   RA Area:     16.60 cm LA Vol (A2C):   70.3 ml 36.86 ml/m  RA Volume:   42.00 ml  22.02 ml/m LA Vol (A4C):   66.0 ml 34.60 ml/m LA Biplane Vol: 71.6 ml 37.54 ml/m  AORTIC VALVE  PULMONIC VALVE AV Area (Vmax): 1.86 cm     PR End Diast Vel: 1.56 msec AV Vmax:        118.00 cm/s AV Peak Grad:   5.6 mmHg LVOT Vmax:      70.00 cm/s LVOT Vmean:     49.900 cm/s LVOT VTI:       0.094 m  AORTA Ao Root diam: 2.90 cm Ao Asc diam:  3.20 cm MITRAL VALVE                TRICUSPID VALVE MV Area (PHT): 5.84 cm     TR Peak grad:   19.7 mmHg MV Decel Time: 130 msec     TR Vmax:        222.00 cm/s MV E velocity: 106.00 cm/s                             SHUNTS                             Systemic VTI:  0.09 m                             Systemic Diam: 2.00 cm Gwyndolyn Kaufman MD Electronically signed by Gwyndolyn Kaufman MD Signature Date/Time: 09/26/2021/10:58:34 AM    Final (Updated)    US Abdomen Limited RUQ (LIVER/GB)  Result Date: 09/25/2021 CLINICAL DATA:  Elevated LFT EXAM: ULTRASOUND ABDOMEN LIMITED RIGHT UPPER QUADRANT COMPARISON:  None. FINDINGS: Gallbladder: No gallstones or wall thickening visualized. No sonographic Murphy sign noted by sonographer. Common bile duct: Diameter: 4 mm Liver: No focal lesion identified. Within normal limits in parenchymal echogenicity. Portal vein is patent on color Doppler imaging with normal direction of blood flow towards the liver. Other: Right pleural effusion. Incompletely evaluated right hydronephrosis IMPRESSION: 1. Ultrasound appearance of the liver is within normal limits. 2. Right pleural effusion 3. Incompletely visualized right hydronephrosis Electronically Signed   By: Donavan Foil M.D.   On: 09/25/2021 22:23    Cardiac Studies   See above  Impression   Principal  Problem:   Sepsis due to cellulitis [facility]) Active Problems:   Essential hypertension   Acute renal failure (ARF) (HCC)   Macrocytic anemia   Pleural effusion, bilateral   Recommendation   Acute systolic congestive heart failure New diagnosis of systolic congestive heart failure.  May be related to longstanding alcohol use or coronary disease as he is noted to have coronary calcifications.  Could also be related to malignancy.  At this point, given elevated creatinine, not a candidate for invasive work-up.  Would recommend medical therapy as tolerated.  Agree with IV diuresis and continue to follow creatinine.  Continue metoprolol.  Rhythm appears to be sinus/sinus arrhythmia.  Not a candidate for ACE inhibitor, ARB or ARNI at this point due to AKI. Acute renal failure secondary to postobstructive uropathy Improved somewhat after catheter placement but found to have significantly elevated PSA and likely metastatic prostate cancer.  Okay to continue with IV diuretics at this point since the obstruction has been relieved.  Creatinine is improving Probable metastatic prostate cancer Had postobstructive uropathy with very high PSA and CT findings in the chest suggestive of probable metastatic prostate cancer.  Urology is following.  The Foley catheter is putting out bloody colored urine.  Has improved somewhat with relief of obstruction. Right lower  extremity cellulitis On antibiotic therapy per hospital medicine, noted large bullae, significant lower extremity edema on diuretics.  Thanks for the consultation.  Cardiology will follow with you.  Time Spent Directly with Patient:  I have spent a total of 65 minutes with the patient reviewing hospital notes, telemetry, EKGs, labs and examining the patient as well as establishing an assessment and plan that was discussed personally with the patient.  > 50% of time was spent in direct patient care.  Length of Stay:  LOS: 1 day   Pixie Casino,  MD, [facility], [place] Director of the Advanced Lipid Disorders &  Cardiovascular Risk Reduction Clinic Diplomate of the American Board of Clinical Lipidology Attending Cardiologist  Direct Dial: [phone]  Fax: [phone]  Website:  [email or web address] 09/26/2021, 11:31 AM

## 2021-09-27 ENCOUNTER — Inpatient Hospital Stay (HOSPITAL_COMMUNITY): Payer: Medicare (Managed Care)

## 2021-09-27 DIAGNOSIS — I4891 Unspecified atrial fibrillation: Secondary | ICD-10-CM

## 2021-09-27 LAB — COMPREHENSIVE METABOLIC PANEL
ALT: 102 U/L — ABNORMAL HIGH (ref 0–44)
AST: 45 U/L — ABNORMAL HIGH (ref 15–41)
Albumin: 2.4 g/dL — ABNORMAL LOW (ref 3.5–5.0)
Alkaline Phosphatase: 140 U/L — ABNORMAL HIGH (ref 38–126)
Anion gap: 14 (ref 5–15)
BUN: 245 mg/dL — ABNORMAL HIGH (ref 8–23)
CO2: 17 mmol/L — ABNORMAL LOW (ref 22–32)
Calcium: 7.6 mg/dL — ABNORMAL LOW (ref 8.9–10.3)
Chloride: 103 mmol/L (ref 98–111)
Creatinine, Ser: 4.79 mg/dL — ABNORMAL HIGH (ref 0.61–1.24)
GFR, Estimated: 11 mL/min — ABNORMAL LOW (ref >60)
Glucose, Bld: 115 mg/dL — ABNORMAL HIGH (ref 70–99)
Potassium: 3.5 mmol/L (ref 3.5–5.1)
Sodium: 134 mmol/L — ABNORMAL LOW (ref 135–145)
Total Bilirubin: 0.8 mg/dL (ref 0.3–1.2)
Total Protein: 5.7 g/dL — ABNORMAL LOW (ref 6.5–8.1)

## 2021-09-27 LAB — CBC
HCT: 29.3 % — ABNORMAL LOW (ref 39.0–52.0)
Hemoglobin: 9.8 g/dL — ABNORMAL LOW (ref 13.0–17.0)
MCH: 33.7 pg (ref 26.0–34.0)
MCHC: 33.4 g/dL (ref 30.0–36.0)
MCV: 100.7 fL — ABNORMAL HIGH (ref 80.0–100.0)
Platelets: 132 10*3/uL — ABNORMAL LOW (ref 150–400)
RBC: 2.91 MIL/uL — ABNORMAL LOW (ref 4.22–5.81)
RDW: 13.9 % (ref 11.5–15.5)
WBC: 17.2 10*3/uL — ABNORMAL HIGH (ref 4.0–10.5)
nRBC: 0 % (ref 0.0–0.2)

## 2021-09-27 LAB — PROCALCITONIN: Procalcitonin: 6 ng/mL

## 2021-09-27 LAB — TSH: TSH: 1.541 u[IU]/mL (ref 0.350–4.500)

## 2021-09-27 LAB — MAGNESIUM: Magnesium: 2 mg/dL (ref 1.7–2.4)

## 2021-09-27 LAB — VANCOMYCIN, RANDOM: Vancomycin Rm: 16

## 2021-09-27 LAB — GLUCOSE, CAPILLARY
Glucose-Capillary: 111 mg/dL — ABNORMAL HIGH (ref 70–99)
Glucose-Capillary: 175 mg/dL — ABNORMAL HIGH (ref 70–99)

## 2021-09-27 MED ORDER — AMIODARONE HCL IN DEXTROSE 360-4.14 MG/200ML-% IV SOLN
30.0000 mg/h | INTRAVENOUS | Status: DC
  Administered 2021-09-27 – 2021-09-29 (×5): 30 mg/h via INTRAVENOUS
  Filled 2021-09-27 (×5): qty 200

## 2021-09-27 MED ORDER — SODIUM CHLORIDE 0.9 % IV SOLN
2.0000 g | INTRAVENOUS | Status: AC
  Administered 2021-09-27 – 2021-10-04 (×8): 2 g via INTRAVENOUS
  Filled 2021-09-27 (×8): qty 20

## 2021-09-27 MED ORDER — NEPRO/CARBSTEADY PO LIQD
237.0000 mL | ORAL | Status: DC
  Administered 2021-09-29 – 2021-10-04 (×5): 237 mL via ORAL
  Filled 2021-09-27 (×8): qty 237

## 2021-09-27 MED ORDER — AMIODARONE HCL IN DEXTROSE 360-4.14 MG/200ML-% IV SOLN
60.0000 mg/h | INTRAVENOUS | Status: DC
  Administered 2021-09-27: 60 mg/h via INTRAVENOUS
  Filled 2021-09-27: qty 200

## 2021-09-27 MED ORDER — VANCOMYCIN HCL 750 MG/150ML IV SOLN
750.0000 mg | Freq: Once | INTRAVENOUS | Status: DC
  Filled 2021-09-27: qty 150

## 2021-09-27 MED ORDER — RENA-VITE PO TABS
1.0000 | ORAL_TABLET | Freq: Every day | ORAL | Status: DC
  Administered 2021-09-27 – 2021-10-03 (×7): 1 via ORAL
  Filled 2021-09-27 (×7): qty 1

## 2021-09-27 MED ORDER — VANCOMYCIN HCL 750 MG/150ML IV SOLN: 750.0000 mg | Freq: Once | INTRAVENOUS | Status: DC

## 2021-09-27 MED ORDER — METOPROLOL TARTRATE 5 MG/5ML IV SOLN
2.5000 mg | Freq: Once | INTRAVENOUS | Status: AC
  Administered 2021-09-27: 2.5 mg via INTRAVENOUS
  Filled 2021-09-27: qty 5

## 2021-09-27 MED ORDER — PROSOURCE PLUS PO LIQD
30.0000 mL | Freq: Two times a day (BID) | ORAL | Status: DC
  Administered 2021-09-27 – 2021-10-05 (×12): 30 mL via ORAL
  Filled 2021-09-27 (×15): qty 30

## 2021-09-27 MED ORDER — AMIODARONE LOAD VIA INFUSION
150.0000 mg | Freq: Once | INTRAVENOUS | Status: AC
  Administered 2021-09-27: 150 mg via INTRAVENOUS
  Filled 2021-09-27: qty 83.34

## 2021-09-27 NOTE — Progress Notes (Signed)
OT Cancellation Note  Patient Details Name: Daniel Andersen MRN: [id number] DOB: 1932/07/19   Cancelled Treatment:    Reason Eval/Treat Not Completed: Medical issues which prohibited therapy. Pt continues with afib with RVR with HR as high as 140's at rest--will hold therapy eval for now.  [place], OTR/L Acute Rehab Services Pager [phone] Office [phone]    Almon Register 09/27/2021, 9:12 AM

## 2021-09-27 NOTE — Progress Notes (Signed)
   09/27/21 2218  Assess: MEWS Score  Pulse Rate (!) 101  ECG Heart Rate (!) 120  Resp (!) 22  Level of Consciousness Alert  SpO2 100 %  O2 Device Room Air  Patient Activity (if Appropriate) In bed  Assess: MEWS Score  MEWS Temp 0  MEWS Systolic 1  MEWS Pulse 2  MEWS RR 1  MEWS LOC 0  MEWS Score 4  MEWS Score Color Red  Assess: if the MEWS score is Yellow or Red  Were vital signs taken at a resting state? Yes  Focused Assessment No change from prior assessment  Does the patient meet 2 or more of the SIRS criteria? Yes  Does the patient have a confirmed or suspected source of infection? Yes  Provider and Rapid Response Notified? Yes  MEWS guidelines implemented *See Row Information* Yes  Treat  MEWS Interventions Escalated (See documentation below)  Pain Scale 0-10  Pain Score 0  Take Vital Signs  Increase Vital Sign Frequency  Red: Q 1hr X 4 then Q 4hr X 4, if remains red, continue Q 4hrs  Escalate  MEWS: Escalate Red: discuss with charge nurse/RN and provider, consider discussing with RRT  Notify: Charge Nurse/RN  Date Charge Nurse/RN Notified 09/27/21  Time Charge Nurse/RN Notified 2223  Notify: Provider  Provider Name/Title Jeannette Corpus, NP  Date Provider Notified 09/27/21  Time Provider Notified 2225  Notification Type Page  Notification Reason Change in status (BP 81/57 MAP 66 HR 113. Patient is now red mews. Amio is still going at 16.60ml/hr)  Provider response Other (Comment) (Stop amio and contact cardiology.)  Date of Provider Response 09/27/21  Time of Provider Response 2226  Notify: Rapid Response  Name of Rapid Response RN Notified Janelle, RN  Date Rapid Response Notified 09/27/21  Time Rapid Response Notified 2224  Document  Patient Outcome Other (Comment) (Patient remains on amio per cardiology ( [place]).)  Progress note created (see row info) Yes  Assess: SIRS CRITERIA  SIRS Temperature  0  SIRS Pulse 1  SIRS Respirations  1  SIRS WBC 0   SIRS Score Sum  2

## 2021-09-27 NOTE — Progress Notes (Addendum)
Pharmacy Antibiotic Note  Daniel Andersen is a 87 y.o. male admitted on 09/25/2021 with cellulitis.  Pharmacy has been consulted for vancomycin and cefepime dosing.  Today, 09/27/21 WBC 22.6>>17.2 PCT 6.98> 6 SCr 5.18> 4.79 . CrCl 10.5 ml/min AKI d/t to bladder outlet obstruction> should improve w/ foley placement Vancomycin random @ 0536 = 16 mcg/ml drawn ~ 22.5 hrs after vancomycin 1500 mg dose  Plan: Continue Cefepime 1 g IV q24h Vancomycin  750 mg IV x 1 dose today  no standing dose given poor renal function. Consider changing vancomycin to zyvox  Monitor renal function, WBC, temp,  Random vancomycin level 12/10   Height: 5\' 8"  (172.7 cm) Weight: 80.4 kg (177 lb 4 oz) IBW/kg (Calculated) : 68.4  Temp (24hrs), Avg:97.7 F (36.5 C), Min:97.5 F (36.4 C), Max:97.9 F (36.6 C)  Recent Labs  Lab 09/25/21 1835 09/26/21 0605 09/27/21 0536  WBC 22.6*  --  17.2*  CREATININE 5.18* 4.93* 4.79*  LATICACIDVEN 1.5  --   --   VANCORANDOM  --   --  16     Estimated Creatinine Clearance: 10.5 mL/min (A) (by C-G formula based on SCr of 4.79 mg/dL (H)).    No Known Allergies Antimicrobials this admission: cefepime 12/7 >>  vancomycin 12/7 >>  Dose adjustments this admission: 12/9 0536 VR: 16 ~ 22.6 hrs after 1500 x 1 Microbiology results: 12/7 BCx2 ngtd 12/8 MRSA - 12/8 Hep A B C NR  Thank you for allowing pharmacy to be a part of this patient's care.  Eudelia Bunch, Pharm.D 09/27/2021 8:25 AM

## 2021-09-27 NOTE — TOC Initial Note (Signed)
Transition of Care [facility]) - Initial/Assessment Note    Patient Details  Name: Daniel Andersen MRN: [id number] Date of Birth: 14-Oct-1932  [facility]) CM/SW Contact:    Dessa Phi, RN Phone Number: 09/27/2021, 9:53 AM  Clinical Narrative:  Referral for med asst-spoke to dtr [name] informed has insurance w/script coverage(obligated co pay);goodrx website;will provide pcp general listing since has insurance;encouraged to contact customer service through insurance for pcp. Continue to monitor.PT eval await recc.                 Expected Discharge Plan:  (TBD) Barriers to Discharge: Continued Medical Work up   Patient Goals and CMS Choice Patient states their goals for this hospitalization and ongoing recovery are:: go home CMS Medicare.gov Compare Post Acute Care list provided to:: Patient Represenative (must comment) [name] dtr [phone]) Choice offered to / list presented to : Adult Children  Expected Discharge Plan and Services Expected Discharge Plan:  (TBD)   Discharge Planning Services: CM Consult   Living arrangements for the past 2 months: Single Family Home                                      Prior Living Arrangements/Services Living arrangements for the past 2 months: Single Family Home Lives with:: Self Patient language and need for interpreter reviewed:: Yes Do you feel safe going back to the place where you live?: Yes      Need for Family Participation in Patient Care: No (Comment) Care giver support system in place?: Yes (comment) Current home services: DME (rw) Criminal Activity/Legal Involvement Pertinent to Current Situation/Hospitalization: No - Comment as needed  Activities of Daily Living Home Assistive Devices/Equipment: Eyeglasses, Environmental consultant (specify type), Dentures (specify type) (dentures but unknown what kind by daughter) ADL Screening (condition at time of admission) Patient's cognitive ability adequate to safely complete  daily activities?: No Is the patient deaf or have difficulty hearing?: Yes Does the patient have difficulty seeing, even when wearing glasses/contacts?: No Does the patient have difficulty concentrating, remembering, or making decisions?: Yes Patient able to express need for assistance with ADLs?: Yes Does the patient have difficulty dressing or bathing?: Yes Independently performs ADLs?: No Communication: Independent Dressing (OT): Needs assistance Is this a change from baseline?: Pre-admission baseline Grooming: Independent Feeding: Independent Bathing: Needs assistance Is this a change from baseline?: Pre-admission baseline Toileting: Needs assistance Is this a change from baseline?: Pre-admission baseline In/Out Bed: Needs assistance Is this a change from baseline?: Pre-admission baseline Walks in Home: Needs assistance Is this a change from baseline?: Pre-admission baseline Does the patient have difficulty walking or climbing stairs?: Yes Weakness of Legs: Both Weakness of Arms/Hands: Both  Permission Sought/Granted Permission sought to share information with : Case Manager Permission granted to share information with : Yes, Verbal Permission Granted  Share Information with NAME: Case manager     Permission granted to share info w Relationship: [name] dtr [phone]     Emotional Assessment Appearance:: Appears stated age Attitude/Demeanor/Rapport: Gracious Affect (typically observed): Accepting Orientation: : Oriented to Self, Oriented to Place, Oriented to  Time, Oriented to Situation Alcohol / Substance Use: Not Applicable Psych Involvement: No (comment)  Admission diagnosis:  Dyspnea [R06.00] Renal insufficiency [N28.9] Elevated LFTs [R79.89] Sepsis due to cellulitis (HCC) [L03.90, A41.9] Leukocytosis, unspecified type [D72.829] Cellulitis, unspecified cellulitis site [L03.90] Patient Active Problem List   Diagnosis Date  Noted   Sepsis due to cellulitis  ([place]) 09/25/2021   Acute renal failure (ARF) ([place]) 09/25/2021   Macrocytic anemia 09/25/2021   Pleural effusion, bilateral 09/25/2021   Decreased mobility 02/24/2018   Leukocytosis 02/24/2018   Vitamin D deficiency 01/12/2018   History of fracture 01/12/2018   History of fall 01/12/2018   Edema 01/12/2018   Traumatic rupture of right quadriceps tendon 05/18/2017   Closed fracture of right patella 05/18/2017   Closed displaced fracture of phalanx of great toe, initial encounter 05/18/2017   Medicare annual wellness visit, subsequent 05/06/2016   Vaccine counseling 05/06/2016   Essential hypertension 04/02/2015   Impaired fasting glucose 04/02/2015   History of colonic polyps 04/02/2015   Osteopenia 04/02/2015   Obesity 04/02/2015   PCP:  Carlena Hurl, PA-C Pharmacy:   [id number] [address], [place] [address] Phone: [phone] Fax: [phone]     Social Determinants of Health (SDOH) Interventions    Readmission Risk Interventions No flowsheet data found.

## 2021-09-27 NOTE — Progress Notes (Signed)
Initial Nutrition Assessment  DOCUMENTATION CODES:   Not applicable  INTERVENTION:  - will order Nepro Shake once/day, each supplement provides 425 kcal and 19 grams protein. - will order 30 ml Prosource Plus BID, each supplement provides 100 kcal and 15 grams protein.  - will order 1 tablet rena-vite/day.  - complete NFPE when feasible.    NUTRITION DIAGNOSIS:   Increased nutrient needs related to acute illness (sepsis) as evidenced by estimated needs.  GOAL:   Patient will meet greater than or equal to 90% of their needs  MONITOR:   PO intake, Supplement acceptance, Labs, Weight trends, Skin, I & O's  REASON FOR ASSESSMENT:   Malnutrition Screening Tool  ASSESSMENT:   87 year old male with medical history of essential HTN and alcohol use. He presented to the ED on 12/7 via EMD due to AMS and worsening RLE wound and BLE edema since Thanksgiving.  Nurse and tech in patient's room at the time of attempted visit. The only documented meal intake since admission was 50% of breakfast this AM (175 kcal and 4 grams protein).  He has not been seen by a [place] [facility] at any time in the past.  Weight today is 177 lb and the most recently documented weight PTA was 189 lb on 02/24/18.   Labs reviewed; CBGs: 111 and 175 mg/dl, Na: 134 mmol/l, BUN: 245 mg/dl, creatinine: 4.79 mg/dl, Ca: 7.6 mg/dl, Alk Phos elevated, GFR: 11 ml/min.  Medications reviewed; 40 mg IV lasix BID.    NUTRITION - FOCUSED PHYSICAL EXAM:  Unable to complete at this time.   Diet Order:   Diet Order             Diet renal with fluid restriction Fluid restriction: 1200 mL Fluid; Room service appropriate? Yes; Fluid consistency: Thin  Diet effective now                   EDUCATION NEEDS:   Not appropriate for education at this time  Skin:  Skin Assessment: Skin Integrity Issues: Skin Integrity Issues:: Other (Comment) Other: MASD to coccyx and bilateral buttocks; non-pressure injury to R  foot  Last BM:  12/8 (type 6)  Height:   Ht Readings from Last 1 Encounters:  09/25/21 5' 8" (1.727 m)    Weight:   Wt Readings from Last 1 Encounters:  09/27/21 80.4 kg     Estimated Nutritional Needs:  Kcal:  1800-2000 kcal Protein:  90-100 grams Fluid:  >/= 1.2 L/day      Jarome Matin, MS, RD, LDN, CNSC Inpatient Clinical Dietitian RD pager # available in [facility]  After hours/weekend pager # available in [facility]

## 2021-09-27 NOTE — Progress Notes (Signed)
PT Cancellation Note  Patient Details Name: Daniel Andersen MRN: [id number] DOB: [birth year]   Cancelled Treatment:    Reason Eval/Treat Not Completed: Medical issues which prohibited therapy (pt is in a-fib with resting HR in 140s. Will follow.)  Philomena Doheny PT 09/27/2021  Acute Rehabilitation Services Pager [phone] Office [phone]

## 2021-09-27 NOTE — Care Management Important Message (Signed)
Important Message  Patient Details IM Letter placed in Patients room. Name: Daniel Andersen MRN: [id number] Date of Birth: [birth year]   Medicare Important Message Given:  Yes     Kerin Salen 09/27/2021, 1:32 PM

## 2021-09-27 NOTE — Progress Notes (Signed)
Subjective: Patient sleeping. Firmagon ordered.  Objective: Vital signs in last 24 hours: Temp:  [97.5 F (36.4 C)-97.9 F (36.6 C)] 97.5 F (36.4 C) (12/09 1425) Pulse Rate:  [73-100] 78 (12/09 1405) Resp:  [20] 20 (12/09 1425) BP: (94-126)/(60-74) 112/74 (12/09 1425) SpO2:  [97 %-100 %] 100 % (12/09 1425) Weight:  [80.4 kg] 80.4 kg (12/09 0125)  Intake/Output from previous day: 12/08 0701 - 12/09 0700 In: 20 [P.O.:660; IV Piggyback:100] Out: 3050 [Urine:3050] Intake/Output this shift: Total I/O In: 240 [P.O.:240] Out: -   Physical Exam:  Constitutional: Vital signs reviewed. WD WN in NAD   Eyes: PERRL, No scleral icterus.   Cardiovascular: RRR Pulmonary/Chest: Normal effort Extremities: No cyanosis or edema   Lab Results: Recent Labs    09/25/21 1835 09/27/21 0536  HGB 10.6* 9.8*  HCT 32.7* 29.3*   BMET Recent Labs    09/26/21 0605 09/27/21 0536  NA 134* 134*  K 4.4 3.5  CL 103 103  CO2 15* 17*  GLUCOSE 108* 115*  BUN 128* 245*  CREATININE 4.93* 4.79*  CALCIUM 8.2* 7.6*   Recent Labs    09/25/21 1835 09/26/21 0605  INR 2.4* 2.1*   No results for input(s): LABURIN in the last 72 hours. Results for orders placed or performed during the hospital encounter of 09/25/21  Culture, blood (routine x 2)     Status: None (Preliminary result)   Collection Time: 09/25/21  6:35 PM   Specimen: BLOOD  Result Value Ref Range Status   Specimen Description   Final    BLOOD SITE NOT SPECIFIED Performed at [facility] [address], [place], [place] [address]    Special Requests   Final    BOTTLES DRAWN AEROBIC AND ANAEROBIC Blood Culture adequate volume Performed at [facility] [address] [place], [place] [address]    Culture   Final    NO GROWTH 2 DAYS Performed at [facility] [address] [place], [place] [address]    Report Status PENDING  Incomplete  Culture, blood (routine x 2)     Status:  None (Preliminary result)   Collection Time: 09/25/21  6:35 PM   Specimen: BLOOD  Result Value Ref Range Status   Specimen Description   Final    BLOOD BLOOD LEFT HAND Performed at [facility] [address], [place], [place] [address]    Special Requests   Final    BOTTLES DRAWN AEROBIC AND ANAEROBIC Blood Culture results may not be optimal due to an inadequate volume of blood received in culture bottles Performed at [facility] [address] [place], [place] [address]    Culture   Final    NO GROWTH 2 DAYS Performed at [facility] [address] [place], [place] [address]    Report Status PENDING  Incomplete  Resp Panel by RT-PCR (Flu A&B, Covid) Peripheral     Status: None   Collection Time: 09/25/21  6:35 PM   Specimen: Peripheral; Nasopharyngeal(NP) swabs in vial transport medium  Result Value Ref Range Status   SARS Coronavirus 2 by RT PCR NEGATIVE NEGATIVE Final    Comment: (NOTE) SARS-CoV-2 target nucleic acids are NOT DETECTED.  The SARS-CoV-2 RNA is generally detectable in upper respiratory specimens during the acute phase of infection. The lowest concentration of SARS-CoV-2 viral copies this assay can detect is 138 copies/mL. A negative result does not preclude SARS-Cov-2 infection and should not  be used as the sole basis for treatment or other patient management decisions. A negative result may occur with  improper specimen collection/handling, submission of specimen other than nasopharyngeal swab, presence of viral mutation(s) within the areas targeted by this assay, and inadequate number of viral copies(<138 copies/mL). A negative result must be combined with clinical observations, patient history, and epidemiological information. The expected result is Negative.  Fact Sheet for Patients:  EntrepreneurPulse.com.au  Fact Sheet for Healthcare Providers:   IncredibleEmployment.be  This test is no t yet approved or cleared by the [place] FDA and  has been authorized for detection and/or diagnosis of SARS-CoV-2 by FDA under an Emergency Use Authorization (EUA). This EUA will remain  in effect (meaning this test can be used) for the duration of the COVID-19 declaration under Section 564(b)(1) of the Act, 21 U.S.C.section 360bbb-3(b)(1), unless the authorization is terminated  or revoked sooner.       Influenza A by PCR NEGATIVE NEGATIVE Final   Influenza B by PCR NEGATIVE NEGATIVE Final    Comment: (NOTE) The Xpert Xpress SARS-CoV-2/FLU/RSV plus assay is intended as an aid in the diagnosis of influenza from Nasopharyngeal swab specimens and should not be used as a sole basis for treatment. Nasal washings and aspirates are unacceptable for Xpert Xpress SARS-CoV-2/FLU/RSV testing.  Fact Sheet for Patients: EntrepreneurPulse.com.au  Fact Sheet for Healthcare Providers: IncredibleEmployment.be  This test is not yet approved or cleared by the [place] FDA and has been authorized for detection and/or diagnosis of SARS-CoV-2 by FDA under an Emergency Use Authorization (EUA). This EUA will remain in effect (meaning this test can be used) for the duration of the COVID-19 declaration under Section 564(b)(1) of the Act, 21 U.S.C. section 360bbb-3(b)(1), unless the authorization is terminated or revoked.  Performed at [facility], [place] [address], [place], [place] [address]   MRSA Next Gen by PCR, Nasal     Status: None   Collection Time: 09/26/21  2:22 PM   Specimen: Nasal Mucosa; Nasal Swab  Result Value Ref Range Status   MRSA by PCR Next Gen NOT DETECTED NOT DETECTED Final    Comment: (NOTE) The GeneXpert MRSA Assay (FDA approved for NASAL specimens only), is one component of a comprehensive MRSA colonization surveillance program. It is not intended  to diagnose MRSA infection nor to guide or monitor treatment for MRSA infections. Test performance is not FDA approved in patients less than 73 years old. Performed at [facility], [place] [address], [place], [place] [address]     Studies/Results: DG Chest 1 View  Result Date: 09/25/2021 CLINICAL DATA:  Dyspnea EXAM: CHEST  1 VIEW COMPARISON:  03/21/2013 FINDINGS: Cardiomegaly with vascular congestion, pulmonary edema and bilateral effusions. Bibasilar consolidations. Aortic atherosclerosis. No pneumothorax. Possible tracheal deviation to the right. Multifocal osseous sclerosis is suspicious for metastatic disease. IMPRESSION: 1. Cardiomegaly with vascular congestion, pulmonary edema and bilateral effusions. Bibasilar consolidations consistent with atelectasis or pneumonia 2. Multifocal osseous sclerosis, suspicious for metastatic disease. 3. Suspect tracheal deviation to the right, left paratracheal mass cannot be excluded. Suggest correlation with chest CT. Electronically Signed   By: Donavan Foil M.D.   On: 09/25/2021 20:03   DG Tibia/Fibula Right  Result Date: 09/25/2021 CLINICAL DATA:  Lower extremity wound EXAM: RIGHT TIBIA AND FIBULA - 2 VIEW COMPARISON:  None. FINDINGS: No fracture or malalignment. Diffuse soft tissue swelling. Vascular calcifications. No soft tissue emphysema IMPRESSION: No acute osseous abnormality Electronically Signed   By: Maudie Mercury  Francoise Ceo M.D.   On: 09/25/2021 20:05   CT CHEST WO CONTRAST  Result Date: 09/25/2021 CLINICAL DATA:  Evaluate pleural effusion EXAM: CT CHEST WITHOUT CONTRAST TECHNIQUE: Multidetector CT imaging of the chest was performed following the standard protocol without IV contrast. COMPARISON:  Chest x-ray from earlier in the same day FINDINGS: Cardiovascular: Atherosclerotic calcifications are noted without aneurysmal dilatation. Coronary calcifications are seen. No cardiac enlargement is noted. No pericardial effusion is seen.  Pulmonary artery is not significantly enlarged. Mediastinum/Nodes: Thoracic inlet is within normal limits. No sizable hilar or mediastinal adenopathy is noted. The esophagus as visualized is within normal limits. Lungs/Pleura: Bilateral large pleural effusions are noted. Associated basilar atelectasis is noted. Lungs are otherwise well aerated without focal infiltrate or parenchymal nodules. Upper Abdomen: Visualized upper abdomen demonstrates fullness of the renal pelves bilaterally of uncertain significance/chronicity. Musculoskeletal: Multiple sclerotic lesions are identified throughout the visualized bony skeleton consistent with metastatic disease. This raises suspicion for prostate carcinoma. IMPRESSION: Changes consistent with bony metastatic disease likely related to prostate carcinoma. Further workup is recommended. Bilateral pleural effusions with lower lobe atelectasis. No focal mass lesion is noted. Fullness in the collecting systems of the kidneys bilaterally which may be related to bladder outlet obstruction but incompletely evaluated on this exam. Aortic Atherosclerosis (ICD10-I70.0). Electronically Signed   By: Inez Catalina M.D.   On: 09/25/2021 20:41   US RENAL  Result Date: 09/25/2021 CLINICAL DATA:  Acute renal failure EXAM: RENAL / URINARY TRACT ULTRASOUND COMPLETE COMPARISON:  None. FINDINGS: Right Kidney: Renal measurements: 11.5 x 4.7 x 4.2 cm. = volume: 18 mL. Significant hydronephrosis is noted. Continues into the proximal right ureter. Left Kidney: Renal measurements: 11.5 x 5.7 x 4.9 cm. = volume: 169 mL. Significant hydronephrosis is noted similar to that seen on the right. 1.5 cm cyst is noted inferiorly. Bladder: Bladder is well distended. Some mild irregularity of the posterior bladder wall is noted without discrete mass. May represent increased trabeculation possibly related to bladder outlet obstruction. Distal ureters appear dilated. Note is made of a left-sided pleural  effusion. Other: None. IMPRESSION: Significant hydronephrosis bilaterally which extends to the level of the urinary bladder. Findings suggestive of bladder outlet obstruction are noted. Repeat imaging following decompression of the bladder may be helpful as clinically indicated. Left pleural effusion. Left renal cyst. Electronically Signed   By: Inez Catalina M.D.   On: 09/25/2021 22:20   DG Foot Complete Right  Result Date: 09/25/2021 CLINICAL DATA:  Right lower extremity wound infection EXAM: RIGHT FOOT COMPLETE - 3+ VIEW COMPARISON:  None. FINDINGS: No fracture or malalignment. 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 Diffuse soft tissue swelling without emphysema. Vascular calcifications. Small plantar calcaneal spur. IMPRESSION: No acute osseous abnormality. Electronically Signed   By: Donavan Foil M.D.   On: 09/25/2021 20:04   ECHOCARDIOGRAM COMPLETE  Result Date: 09/26/2021    ECHOCARDIOGRAM REPORT   Patient Name:   Daniel Andersen Date of Exam: 09/26/2021 Medical Rec #:  [id number]       Height:       68.0 in Accession #:    [id number]      Weight:       170.0 lb Date of Birth:  1932/06/25       BSA:          1.907 m Patient Age:    4 years        BP:           122/72 mmHg Patient  Gender: M               HR:           141 bpm. Exam Location:  Inpatient Procedure: 2D Echo, Color Doppler, Cardiac Doppler and Intracardiac            Opacification Agent Indications:     CHF  History:         Patient has no prior history of Echocardiogram examinations.                  Risk Factors:Hypertension.  Sonographer:     Jyl Heinz Referring Phys:  [id number] Cleaster Corin PATEL Diagnosing Phys: Gwyndolyn Kaufman MD IMPRESSIONS  1. Left ventricular ejection fraction, by estimation, is 25 to 30%. The left ventricle has severely decreased function. The left ventricle demonstrates global hypokinesis. There is mild concentric left ventricular hypertrophy. Left ventricular diastolic  parameters are indeterminate. Elevated  left atrial pressure.  2. Right ventricular systolic function is severely reduced. The right ventricular size is normal.  3. Left atrial size was mildly dilated.  4. Moderate pleural effusion in the left lateral region.  5. The mitral valve is degenerative. Trivial mitral valve regurgitation. Moderate mitral annular calcification.  6. The aortic valve is tricuspid. There is mild calcification of the aortic valve. There is mild thickening of the aortic valve. Aortic valve regurgitation is trivial. Aortic valve sclerosis is present, with no evidence of aortic valve stenosis.  7. The inferior vena cava is dilated in size with <50% respiratory variability, suggesting right atrial pressure of 15 mmHg.  8. Tachycardic during study with HR 130s with no discernable p-waves seen (possible Afib) Comparison(s): No prior Echocardiogram. FINDINGS  Left Ventricle: Left ventricular ejection fraction, by estimation, is 25 to 30%. The left ventricle has severely decreased function. The left ventricle demonstrates global hypokinesis. The left ventricular internal cavity size was normal in size. There is mild concentric left ventricular hypertrophy. Left ventricular diastolic parameters are indeterminate. Elevated left atrial pressure. Right Ventricle: The right ventricular size is normal. No increase in right ventricular wall thickness. Right ventricular systolic function is severely reduced. Left Atrium: Left atrial size was mildly dilated. Right Atrium: Right atrial size was normal in size. Pericardium: There is no evidence of pericardial effusion. Mitral Valve: The mitral valve is degenerative in appearance. There is mild thickening of the mitral valve leaflet(s). There is mild calcification of the mitral valve leaflet(s). Moderate mitral annular calcification. Trivial mitral valve regurgitation. Tricuspid Valve: The tricuspid valve is normal in structure. Tricuspid valve regurgitation is trivial. Aortic Valve: The aortic valve is  tricuspid. There is mild calcification of the aortic valve. There is mild thickening of the aortic valve. Aortic valve regurgitation is trivial. Aortic valve sclerosis is present, with no evidence of aortic valve stenosis. Aortic valve peak gradient measures 5.6 mmHg. Pulmonic Valve: The pulmonic valve was normal in structure. Pulmonic valve regurgitation is trivial. Aorta: The aortic root and ascending aorta are structurally normal, with no evidence of dilitation. Venous: The inferior vena cava is dilated in size with less than 50% respiratory variability, suggesting right atrial pressure of 15 mmHg. IAS/Shunts: No atrial level shunt detected by color flow Doppler. Additional Comments: There is a moderate pleural effusion in the left lateral region.  LEFT VENTRICLE PLAX 2D LVIDd:         4.90 cm      Diastology LVIDs:         4.00 cm  LV e' medial:    5.26 cm/s LV PW:         1.10 cm      LV E/e' medial:  20.2 LV IVS:        1.10 cm      LV e' lateral:   7.07 cm/s LVOT diam:     2.00 cm      LV E/e' lateral: 15.0 LV SV:         30 LV SV Index:   16 LVOT Area:     3.14 cm  LV Volumes (MOD) LV vol d, MOD A2C: 140.0 ml LV vol d, MOD A4C: 135.0 ml LV vol s, MOD A2C: 101.0 ml LV vol s, MOD A4C: 89.8 ml LV SV MOD A2C:     39.0 ml LV SV MOD A4C:     135.0 ml LV SV MOD BP:      44.9 ml RIGHT VENTRICLE            IVC RV Basal diam:  3.60 cm    IVC diam: 2.20 cm RV Mid diam:    3.10 cm RV S prime:     6.92 cm/s TAPSE (M-mode): 0.7 cm LEFT ATRIUM             Index        RIGHT ATRIUM           Index LA diam:        4.10 cm 2.15 cm/m   RA Area:     16.60 cm LA Vol (A2C):   70.3 ml 36.86 ml/m  RA Volume:   42.00 ml  22.02 ml/m LA Vol (A4C):   66.0 ml 34.60 ml/m LA Biplane Vol: 71.6 ml 37.54 ml/m  AORTIC VALVE                 PULMONIC VALVE AV Area (Vmax): 1.86 cm     PR End Diast Vel: 1.56 msec AV Vmax:        118.00 cm/s AV Peak Grad:   5.6 mmHg LVOT Vmax:      70.00 cm/s LVOT Vmean:     49.900 cm/s LVOT VTI:        0.094 m  AORTA Ao Root diam: 2.90 cm Ao Asc diam:  3.20 cm MITRAL VALVE                TRICUSPID VALVE MV Area (PHT): 5.84 cm     TR Peak grad:   19.7 mmHg MV Decel Time: 130 msec     TR Vmax:        222.00 cm/s MV E velocity: 106.00 cm/s                             SHUNTS                             Systemic VTI:  0.09 m                             Systemic Diam: 2.00 cm Gwyndolyn Kaufman MD Electronically signed by Gwyndolyn Kaufman MD Signature Date/Time: 09/26/2021/10:58:34 AM    Final (Updated)    US Abdomen Limited RUQ (LIVER/GB)  Result Date: 09/25/2021 CLINICAL DATA:  Elevated LFT EXAM: ULTRASOUND ABDOMEN LIMITED RIGHT UPPER QUADRANT COMPARISON:  None. FINDINGS: Gallbladder: No gallstones or wall thickening  visualized. No sonographic Murphy sign noted by sonographer. Common bile duct: Diameter: 4 mm Liver: No focal lesion identified. Within normal limits in parenchymal echogenicity. Portal vein is patent on color Doppler imaging with normal direction of blood flow towards the liver. Other: Right pleural effusion. Incompletely evaluated right hydronephrosis IMPRESSION: 1. Ultrasound appearance of the liver is within normal limits. 2. Right pleural effusion 3. Incompletely visualized right hydronephrosis Electronically Signed   By: Donavan Foil M.D.   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 ARF--will need CT AP w/o contrast--have ordered.  If any GU input needed over weekend please call.   LOS: 2 days   Jorja Loa 09/27/2021, 5:22 PM

## 2021-09-27 NOTE — Progress Notes (Addendum)
PROGRESS NOTE    EDIN KON  ZYY:[id number] DOB: 1932-07-05 DOA: 09/25/2021 PCP: Carlena Hurl, PA-C    Brief Narrative:  CADENCE HASLAM is a 87 year old male with past medical history significant for essential hypertension, and alcohol use who presented to [facility] ED on 12/7 from home via EMS due to altered mental status and worsening right lower extremity wound since Thanksgiving.  Patient has also reported worsening lower extremity edema with associated erythema since this timeframe as well.  Patient denies dysuria, no chest pain, no shortness of breath, no cough/congestion, no nausea/vomiting/diarrhea, no abdominal pain.  In the ED, BP 137/73, HR 99, RR 26, temperature 96.3 F, SPO2 96% on room air.  Sodium 134, potassium 5.0, chloride 100, CO2 17, glucose 135, BUN 115, creatinine 5.18, alkaline phosphatase 199, AST 71, ALT 157, total bilirubin 1.3.  BNP 3000 and urine 27.9.  Lactic acid 1.5.  WBC 22.6.  Hemoglobin 10.6, platelet count 180.  COVID-19 PCR negative.  Influenza A/B PCR negative.  EtOH level less than 10.  Chest x-ray with cardiomegaly, vascular congestion, pulmonary edema and bilateral effusions.  Right foot and tibia/fibula x-ray with no acute osseous abnormality.  Renal ultrasound with significant hydronephrosis bilaterally extending to the level of the urinary bladder suggestive of bladder outlet obstruction.  CT chest without contrast showed changes consistent with bony metastatic disease likely related to prostate carcinoma, bilateral pleural effusions with lower lobe atelectasis with no focal mass lesion noted.  Patient was started on IV vancomycin and cefepime and received 500 cc normal saline.  Hospital service consulted for further evaluation and management.   Assessment & Plan:   Principal Problem:   Sepsis due to cellulitis [facility]) Active Problems:   Essential hypertension   Acute renal failure (ARF) (HCC)   Macrocytic anemia   Pleural effusion,  bilateral   Sepsis, POA Right lower extremity cellulitis Patient presenting to the ED with leukocytosis, respiratory rate greater than 20, HR greater than 90 and significant right lower extremity cellulitis with large intact bullae present at the base of the right first toe.  WBC count elevated 22.6 with procalcitonin 6.98.  MRSA PCR negative.  Initially started on empiric antibiotics with vancomycin and cefepime, now transitioned to ceftriaxone. --WBC 22.6>17.2 --Procal 6.98>6.00 --Blood cultures x2: No growth x 2 days --Ceftriaxone 2 g IV q24h --CBC daily  Acute renal failure Anion gap metabolic acidosis Obstructive uropathy Patient presenting with a creatinine of 5.18. FeNa = 0.5%; which is consistent with a prerenal etiology.  Additionally, patient with likely chronic outlet obstruction 2/2 BPH with likely prostate cancer complicating picture.  Renal ultrasound with significant hydronephrosis bilaterally extending to the level of the urinary bladder suggestive of bladder outlet obstruction. Foley catheter was placed. --Urology following, appreciate assistance --Cr 5.18>4.93>4.79 --Continue Foley catheter --Urine output 3 L past 24 hours --Strict I's and O's; monitoring for postobstructive diuresis --Avoid nephrotoxins, renal dose all medications --Monitor renal function daily  Systolic congestive heart failure, new diagnosis with decompensation Patient with large bilateral pleural effusions, significant peripheral edema, and BNP 3827.9.  TTE with LVEF 25-30%, LV severely decreased function with global hypokinesis, mild concentric LVH, LV diastolic parameters indeterminate, LA mildly dilated, RV systolic function severely reduced, moderate pleural effusion left lateral region, trivial MR, IVC dilated. --Cardiology following, appreciate assistance --net negative  2.3L past 24h and net negative 1.0L since admission --Metoprolol tartrate 25 mg p.o. twice daily --Furosemide 40 mg IV  q12h --Fluid restriction 1200 mL/day --Strict I's and O's and  daily weights  Atrial fibrillation with RVR TSH: Pending --Metoprolol tartrate 25 mg p.o. twice daily --Cardiology starting amiodarone drip today --If atrial fibrillation persists greater than 48 hours, will need start anticoagulation --Continue monitor on telemetry  Osseous metastatic lesions concerning for prostate cancer as primary Several osseous lesions noted on imaging, elevated alkaline phosphatase, andelevated PSA 246.08; highly concerning for prostate cancer. --Urology following --Started on androgen deprivation therapy 12/8  Macrocytic anemia Hemoglobin 10.6 with MCV 106.2 on admission.  Anemia panel with iron 15, TIBC 176, ferritin 322, B12 1404, folate 32.3.  Etiology likely secondary to anemia of chronic medical disease.  Elevated LFTs Etiology likely multifactorial, related to sepsis, congestive hepatopathy.  Also with elevated alkaline phosphatase, likely related to osseous lesions from suspected prostate cancer.  EtOH level less than 10.  Right upper quadrant ultrasound with no focal liver lesion identified, parenchymal echogenicity density within normal limits, portal vein patent.  Alcohol cessation.  Acute hepatitis panel negative. --AST 71>56>45 --ALT 157>128>102 --Avoid hepatotoxins --CMP daily  Alcohol use disorder Patient reports drinking 1-2 beers daily, denies any use within the last week.  Counseled on need for cessation --CIWA protocol  DVT prophylaxis: heparin injection 5,000 Units Start: 09/25/21 2200   Code Status: Full Code Family Communication: Updated daughter present at bedside yesterday, no family present at bedside this morning.  Disposition Plan:  Level of care: Telemetry Status is: Inpatient  Remains inpatient appropriate because: Continues on IV antibiotics for sepsis related to severe cellulitis right lower extremity, on IV diuresis for new diagnosis of systolic congestive heart  failure.     Consultants:  Urology Cardiology  Procedures:  Foley catheter placement 12/7 TTE  Antimicrobials:  Vancomycin 12/7 - 12/9 Cefepime 12/7 - 12/9 Ceftriaxone 12/9>>   Subjective: Patient seen examined at bedside, lying in bed.  No family present.  No specific complaints this morning.  Remains in atrial fibrillation with accelerated rate.  Seen by cardiologist morning and starting amiodarone drip.  Creatinine slightly improved.  No other specific questions or concerns at this time.  Denies headache, no visual changes, no chest pain, no shortness of breath, no abdominal pain.  No acute events other than accelerated heart rate overnight per nursing staff.   Objective: Vitals:   09/26/21 2028 09/27/21 0117 09/27/21 0125 09/27/21 0401  BP: 126/66 94/60  111/70  Pulse: 100 82  86  Resp: 20 20    Temp: (!) 97.5 F (36.4 C) 97.9 F (36.6 C)  97.6 F (36.4 C)  TempSrc: Axillary Axillary  Oral  SpO2: 99% 100%  97%  Weight:   80.4 kg   Height:        Intake/Output Summary (Last 24 hours) at 09/27/2021 1044 Last data filed at 09/27/2021 0900 Gross per 24 hour  Intake 1000 ml  Output 3050 ml  Net -2050 ml   Filed Weights   09/25/21 1756 09/27/21 0125  Weight: 77.1 kg 80.4 kg    Examination:  General exam: Appears calm and comfortable, appears older than stated age, chronically ill in appearance Respiratory system: Breath sounds slightly decreased bilateral bases, mild crackles, no wheezing, normal respiratory effort without accessory muscle use, on room air with SPO2 97% at rest Cardiovascular system: S1 & S2 heard, tachycardic, irregularly irregular rhythm. No JVD, murmurs, rubs, gallops or clicks.  Gastrointestinal system: Abdomen is nondistended, soft and nontender. No organomegaly or masses felt. Normal bowel sounds heard. Central nervous system: Alert and oriented. No focal neurological deficits. Extremities: Moves all extremities independently,  3+ pitting  edema bilateral lower extremities up to knee Skin: Right lower extremity with erythema from right foot extending proximally about two thirds up to the knee with large intact bullae dorsal aspect of base of first toe with significant bilateral edema Psychiatry: Judgement and insight appear poor. Mood & affect appropriate.          Data Reviewed: I have personally reviewed following labs and imaging studies  CBC: Recent Labs  Lab 09/25/21 1835 09/27/21 0536  WBC 22.6* 17.2*  NEUTROABS 20.3*  --   HGB 10.6* 9.8*  HCT 32.7* 29.3*  MCV 106.2* 100.7*  PLT 180 176*   Basic Metabolic Panel: Recent Labs  Lab 09/25/21 1835 09/26/21 0605 09/27/21 0536  NA 134* 134* 134*  K 5.0 4.4 3.5  CL 100 103 103  CO2 17* 15* 17*  GLUCOSE 135* 108* 115*  BUN 115* 128* 245*  CREATININE 5.18* 4.93* 4.79*  CALCIUM 8.4* 8.2* 7.6*  MG  --  2.2 2.0   GFR: Estimated Creatinine Clearance: 10.5 mL/min (A) (by C-G formula based on SCr of 4.79 mg/dL (H)). Liver Function Tests: Recent Labs  Lab 09/25/21 1835 09/26/21 0605 09/27/21 0536  AST 71* 56* 45*  ALT 157* 128* 102*  ALKPHOS 199* 171* 140*  BILITOT 1.3* 1.2 0.8  PROT 7.1 6.3* 5.7*  ALBUMIN 3.3* 2.6* 2.4*   No results for input(s): LIPASE, AMYLASE in the last 168 hours. No results for input(s): AMMONIA in the last 168 hours. Coagulation Profile: Recent Labs  Lab 09/25/21 1835 09/26/21 0605  INR 2.4* 2.1*   Cardiac Enzymes: No results for input(s): CKTOTAL, CKMB, CKMBINDEX, TROPONINI in the last 168 hours. BNP (last 3 results) No results for input(s): PROBNP in the last 8760 hours. HbA1C: No results for input(s): HGBA1C in the last 72 hours. CBG: Recent Labs  Lab 09/25/21 1815 09/26/21 2049 09/27/21 0726  GLUCAP 126* 107* 111*   Lipid Profile: No results for input(s): CHOL, HDL, LDLCALC, TRIG, CHOLHDL, LDLDIRECT in the last 72 hours. Thyroid Function Tests: No results for input(s): TSH, T4TOTAL, FREET4, T3FREE,  THYROIDAB in the last 72 hours. Anemia Panel: Recent Labs    09/26/21 0605  VITAMINB12 1,404*  FOLATE 32.3  FERRITIN 322  TIBC 176*  IRON 15*   Sepsis Labs: Recent Labs  Lab 09/25/21 1835 09/26/21 0605 09/27/21 0536  PROCALCITON  --  6.98 6.00  LATICACIDVEN 1.5  --   --     Recent Results (from the past 240 hour(s))  Culture, blood (routine x 2)     Status: None (Preliminary result)   Collection Time: 09/25/21  6:35 PM   Specimen: BLOOD  Result Value Ref Range Status   Specimen Description   Final    BLOOD SITE NOT SPECIFIED Performed at [facility] [address] [place], [place] [address]    Special Requests   Final    BOTTLES DRAWN AEROBIC AND ANAEROBIC Blood Culture adequate volume Performed at [facility] [address] [place], [place] [address]    Culture   Final    NO GROWTH 2 DAYS Performed at [facility] [address] [place], [place] [address]    Report Status PENDING  Incomplete  Culture, blood (routine x 2)     Status: None (Preliminary result)   Collection Time: 09/25/21  6:35 PM   Specimen: BLOOD  Result Value Ref Range Status   Specimen Description   Final    BLOOD BLOOD  LEFT HAND Performed at [facility], [place] [address], [place], [place] [address]    Special Requests   Final    BOTTLES DRAWN AEROBIC AND ANAEROBIC Blood Culture results may not be optimal due to an inadequate volume of blood received in culture bottles Performed at [facility] [address] [place], [place] [address]    Culture   Final    NO GROWTH 2 DAYS Performed at [facility] [address], [place], [place] [address]    Report Status PENDING  Incomplete  Resp Panel by RT-PCR (Flu A&B, Covid) Peripheral     Status: None   Collection Time: 09/25/21  6:35 PM   Specimen: Peripheral; Nasopharyngeal(NP) swabs in vial transport medium  Result Value Ref Range Status   SARS  Coronavirus 2 by RT PCR NEGATIVE NEGATIVE Final    Comment: (NOTE) SARS-CoV-2 target nucleic acids are NOT DETECTED.  The SARS-CoV-2 RNA is generally detectable in upper respiratory specimens during the acute phase of infection. The lowest concentration of SARS-CoV-2 viral copies this assay can detect is 138 copies/mL. A negative result does not preclude SARS-Cov-2 infection and should not be used as the sole basis for treatment or other patient management decisions. A negative result may occur with  improper specimen collection/handling, submission of specimen other than nasopharyngeal swab, presence of viral mutation(s) within the areas targeted by this assay, and inadequate number of viral copies(<138 copies/mL). A negative result must be combined with clinical observations, patient history, and epidemiological information. The expected result is Negative.  Fact Sheet for Patients:  EntrepreneurPulse.com.au  Fact Sheet for Healthcare Providers:  IncredibleEmployment.be  This test is no t yet approved or cleared by the [place] FDA and  has been authorized for detection and/or diagnosis of SARS-CoV-2 by FDA under an Emergency Use Authorization (EUA). This EUA will remain  in effect (meaning this test can be used) for the duration of the COVID-19 declaration under Section 564(b)(1) of the Act, 21 U.S.C.section 360bbb-3(b)(1), unless the authorization is terminated  or revoked sooner.       Influenza A by PCR NEGATIVE NEGATIVE Final   Influenza B by PCR NEGATIVE NEGATIVE Final    Comment: (NOTE) The Xpert Xpress SARS-CoV-2/FLU/RSV plus assay is intended as an aid in the diagnosis of influenza from Nasopharyngeal swab specimens and should not be used as a sole basis for treatment. Nasal washings and aspirates are unacceptable for Xpert Xpress SARS-CoV-2/FLU/RSV testing.  Fact Sheet for  Patients: EntrepreneurPulse.com.au  Fact Sheet for Healthcare Providers: IncredibleEmployment.be  This test is not yet approved or cleared by the [place] FDA and has been authorized for detection and/or diagnosis of SARS-CoV-2 by FDA under an Emergency Use Authorization (EUA). This EUA will remain in effect (meaning this test can be used) for the duration of the COVID-19 declaration under Section 564(b)(1) of the Act, 21 U.S.C. section 360bbb-3(b)(1), unless the authorization is terminated or revoked.  Performed at [facility], [place] [address], [place], [place] [address]   MRSA Next Gen by PCR, Nasal     Status: None   Collection Time: 09/26/21  2:22 PM   Specimen: Nasal Mucosa; Nasal Swab  Result Value Ref Range Status   MRSA by PCR Next Gen NOT DETECTED NOT DETECTED Final    Comment: (NOTE) The GeneXpert MRSA Assay (FDA approved for NASAL specimens only), is one component of a comprehensive MRSA colonization surveillance program. It is not intended to diagnose MRSA  infection nor to guide or monitor treatment for MRSA infections. Test performance is not FDA approved in patients less than 70 years old. Performed at [facility], [place] [address] [place], [place] [address]          Radiology Studies: DG Chest 1 View  Result Date: 09/25/2021 CLINICAL DATA:  Dyspnea EXAM: CHEST  1 VIEW COMPARISON:  03/21/2013 FINDINGS: Cardiomegaly with vascular congestion, pulmonary edema and bilateral effusions. Bibasilar consolidations. Aortic atherosclerosis. No pneumothorax. Possible tracheal deviation to the right. Multifocal osseous sclerosis is suspicious for metastatic disease. IMPRESSION: 1. Cardiomegaly with vascular congestion, pulmonary edema and bilateral effusions. Bibasilar consolidations consistent with atelectasis or pneumonia 2. Multifocal osseous sclerosis, suspicious for metastatic disease. 3.  Suspect tracheal deviation to the right, left paratracheal mass cannot be excluded. Suggest correlation with chest CT. Electronically Signed   By: Donavan Foil M.D.   On: 09/25/2021 20:03   DG Tibia/Fibula Right  Result Date: 09/25/2021 CLINICAL DATA:  Lower extremity wound EXAM: RIGHT TIBIA AND FIBULA - 2 VIEW COMPARISON:  None. FINDINGS: No fracture or malalignment. Diffuse soft tissue swelling. Vascular calcifications. No soft tissue emphysema IMPRESSION: No acute osseous abnormality Electronically Signed   By: Donavan Foil M.D.   On: 09/25/2021 20:05   CT CHEST WO CONTRAST  Result Date: 09/25/2021 CLINICAL DATA:  Evaluate pleural effusion EXAM: CT CHEST WITHOUT CONTRAST TECHNIQUE: Multidetector CT imaging of the chest was performed following the standard protocol without IV contrast. COMPARISON:  Chest x-ray from earlier in the same day FINDINGS: Cardiovascular: Atherosclerotic calcifications are noted without aneurysmal dilatation. Coronary calcifications are seen. No cardiac enlargement is noted. No pericardial effusion is seen. Pulmonary artery is not significantly enlarged. Mediastinum/Nodes: Thoracic inlet is within normal limits. No sizable hilar or mediastinal adenopathy is noted. The esophagus as visualized is within normal limits. Lungs/Pleura: Bilateral large pleural effusions are noted. Associated basilar atelectasis is noted. Lungs are otherwise well aerated without focal infiltrate or parenchymal nodules. Upper Abdomen: Visualized upper abdomen demonstrates fullness of the renal pelves bilaterally of uncertain significance/chronicity. Musculoskeletal: Multiple sclerotic lesions are identified throughout the visualized bony skeleton consistent with metastatic disease. This raises suspicion for prostate carcinoma. IMPRESSION: Changes consistent with bony metastatic disease likely related to prostate carcinoma. Further workup is recommended. Bilateral pleural effusions with lower lobe  atelectasis. No focal mass lesion is noted. Fullness in the collecting systems of the kidneys bilaterally which may be related to bladder outlet obstruction but incompletely evaluated on this exam. Aortic Atherosclerosis (ICD10-I70.0). Electronically Signed   By: Inez Catalina M.D.   On: 09/25/2021 20:41   US RENAL  Result Date: 09/25/2021 CLINICAL DATA:  Acute renal failure EXAM: RENAL / URINARY TRACT ULTRASOUND COMPLETE COMPARISON:  None. FINDINGS: Right Kidney: Renal measurements: 11.5 x 4.7 x 4.2 cm. = volume: 18 mL. Significant hydronephrosis is noted. Continues into the proximal right ureter. Left Kidney: Renal measurements: 11.5 x 5.7 x 4.9 cm. = volume: 169 mL. Significant hydronephrosis is noted similar to that seen on the right. 1.5 cm cyst is noted inferiorly. Bladder: Bladder is well distended. Some mild irregularity of the posterior bladder wall is noted without discrete mass. May represent increased trabeculation possibly related to bladder outlet obstruction. Distal ureters appear dilated. Note is made of a left-sided pleural effusion. Other: None. IMPRESSION: Significant hydronephrosis bilaterally which extends to the level of the urinary bladder. Findings suggestive of bladder outlet obstruction are noted. Repeat imaging following decompression of the bladder may be helpful as clinically indicated.  Left pleural effusion. Left renal cyst. Electronically Signed   By: Inez Catalina M.D.   On: 09/25/2021 22:20   DG Foot Complete Right  Result Date: 09/25/2021 CLINICAL DATA:  Right lower extremity wound infection EXAM: RIGHT FOOT COMPLETE - 3+ VIEW COMPARISON:  None. FINDINGS: No fracture or malalignment. 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 Diffuse soft tissue swelling without emphysema. Vascular calcifications. Small plantar calcaneal spur. IMPRESSION: No acute osseous abnormality. Electronically Signed   By: Donavan Foil M.D.   On: 09/25/2021 20:04   ECHOCARDIOGRAM COMPLETE  Result  Date: 09/26/2021    ECHOCARDIOGRAM REPORT   Patient Name:   NEHAL SHIVES Date of Exam: 09/26/2021 Medical Rec #:  [id number]       Height:       68.0 in Accession #:    [id number]      Weight:       170.0 lb Date of Birth:  30-Oct-1931       BSA:          1.907 m Patient Age:    38 years        BP:           122/72 mmHg Patient Gender: M               HR:           141 bpm. Exam Location:  Inpatient Procedure: 2D Echo, Color Doppler, Cardiac Doppler and Intracardiac            Opacification Agent Indications:     CHF  History:         Patient has no prior history of Echocardiogram examinations.                  Risk Factors:Hypertension.  Sonographer:     Jyl Heinz Referring Phys:  [id number] Cleaster Corin PATEL Diagnosing Phys: Gwyndolyn Kaufman MD IMPRESSIONS  1. Left ventricular ejection fraction, by estimation, is 25 to 30%. The left ventricle has severely decreased function. The left ventricle demonstrates global hypokinesis. There is mild concentric left ventricular hypertrophy. Left ventricular diastolic  parameters are indeterminate. Elevated left atrial pressure.  2. Right ventricular systolic function is severely reduced. The right ventricular size is normal.  3. Left atrial size was mildly dilated.  4. Moderate pleural effusion in the left lateral region.  5. The mitral valve is degenerative. Trivial mitral valve regurgitation. Moderate mitral annular calcification.  6. The aortic valve is tricuspid. There is mild calcification of the aortic valve. There is mild thickening of the aortic valve. Aortic valve regurgitation is trivial. Aortic valve sclerosis is present, with no evidence of aortic valve stenosis.  7. The inferior vena cava is dilated in size with <50% respiratory variability, suggesting right atrial pressure of 15 mmHg.  8. Tachycardic during study with HR 130s with no discernable p-waves seen (possible Afib) Comparison(s): No prior Echocardiogram. FINDINGS  Left Ventricle: Left ventricular  ejection fraction, by estimation, is 25 to 30%. The left ventricle has severely decreased function. The left ventricle demonstrates global hypokinesis. The left ventricular internal cavity size was normal in size. There is mild concentric left ventricular hypertrophy. Left ventricular diastolic parameters are indeterminate. Elevated left atrial pressure. Right Ventricle: The right ventricular size is normal. No increase in right ventricular wall thickness. Right ventricular systolic function is severely reduced. Left Atrium: Left atrial size was mildly dilated. Right Atrium: Right atrial size was normal in size. Pericardium: There is no evidence of pericardial  effusion. Mitral Valve: The mitral valve is degenerative in appearance. There is mild thickening of the mitral valve leaflet(s). There is mild calcification of the mitral valve leaflet(s). Moderate mitral annular calcification. Trivial mitral valve regurgitation. Tricuspid Valve: The tricuspid valve is normal in structure. Tricuspid valve regurgitation is trivial. Aortic Valve: The aortic valve is tricuspid. There is mild calcification of the aortic valve. There is mild thickening of the aortic valve. Aortic valve regurgitation is trivial. Aortic valve sclerosis is present, with no evidence of aortic valve stenosis. Aortic valve peak gradient measures 5.6 mmHg. Pulmonic Valve: The pulmonic valve was normal in structure. Pulmonic valve regurgitation is trivial. Aorta: The aortic root and ascending aorta are structurally normal, with no evidence of dilitation. Venous: The inferior vena cava is dilated in size with less than 50% respiratory variability, suggesting right atrial pressure of 15 mmHg. IAS/Shunts: No atrial level shunt detected by color flow Doppler. Additional Comments: There is a moderate pleural effusion in the left lateral region.  LEFT VENTRICLE PLAX 2D LVIDd:         4.90 cm      Diastology LVIDs:         4.00 cm      LV e' medial:    5.26 cm/s  LV PW:         1.10 cm      LV E/e' medial:  20.2 LV IVS:        1.10 cm      LV e' lateral:   7.07 cm/s LVOT diam:     2.00 cm      LV E/e' lateral: 15.0 LV SV:         30 LV SV Index:   16 LVOT Area:     3.14 cm  LV Volumes (MOD) LV vol d, MOD A2C: 140.0 ml LV vol d, MOD A4C: 135.0 ml LV vol s, MOD A2C: 101.0 ml LV vol s, MOD A4C: 89.8 ml LV SV MOD A2C:     39.0 ml LV SV MOD A4C:     135.0 ml LV SV MOD BP:      44.9 ml RIGHT VENTRICLE            IVC RV Basal diam:  3.60 cm    IVC diam: 2.20 cm RV Mid diam:    3.10 cm RV S prime:     6.92 cm/s TAPSE (M-mode): 0.7 cm LEFT ATRIUM             Index        RIGHT ATRIUM           Index LA diam:        4.10 cm 2.15 cm/m   RA Area:     16.60 cm LA Vol (A2C):   70.3 ml 36.86 ml/m  RA Volume:   42.00 ml  22.02 ml/m LA Vol (A4C):   66.0 ml 34.60 ml/m LA Biplane Vol: 71.6 ml 37.54 ml/m  AORTIC VALVE                 PULMONIC VALVE AV Area (Vmax): 1.86 cm     PR End Diast Vel: 1.56 msec AV Vmax:        118.00 cm/s AV Peak Grad:   5.6 mmHg LVOT Vmax:      70.00 cm/s LVOT Vmean:     49.900 cm/s LVOT VTI:       0.094 m  AORTA Ao Root diam: 2.90 cm Ao Asc diam:  3.20 cm MITRAL VALVE                TRICUSPID VALVE MV Area (PHT): 5.84 cm     TR Peak grad:   19.7 mmHg MV Decel Time: 130 msec     TR Vmax:        222.00 cm/s MV E velocity: 106.00 cm/s                             SHUNTS                             Systemic VTI:  0.09 m                             Systemic Diam: 2.00 cm Gwyndolyn Kaufman MD Electronically signed by Gwyndolyn Kaufman MD Signature Date/Time: 09/26/2021/10:58:34 AM    Final (Updated)    US Abdomen Limited RUQ (LIVER/GB)  Result Date: 09/25/2021 CLINICAL DATA:  Elevated LFT EXAM: ULTRASOUND ABDOMEN LIMITED RIGHT UPPER QUADRANT COMPARISON:  None. FINDINGS: Gallbladder: No gallstones or wall thickening visualized. No sonographic Murphy sign noted by sonographer. Common bile duct: Diameter: 4 mm Liver: No focal lesion identified. Within normal limits  in parenchymal echogenicity. Portal vein is patent on color Doppler imaging with normal direction of blood flow towards the liver. Other: Right pleural effusion. Incompletely evaluated right hydronephrosis IMPRESSION: 1. Ultrasound appearance of the liver is within normal limits. 2. Right pleural effusion 3. Incompletely visualized right hydronephrosis Electronically Signed   By: Donavan Foil M.D.   On: 09/25/2021 22:23        Scheduled Meds:  amiodarone  150 mg Intravenous Once   Chlorhexidine Gluconate Cloth  6 each Topical Daily   degarelix  240 mg Subcutaneous Once   furosemide  40 mg Intravenous Q12H   heparin  5,000 Units Subcutaneous Q8H   metoprolol tartrate  25 mg Oral BID   sodium chloride flush  3 mL Intravenous Q12H   Continuous Infusions:  amiodarone     Followed by   amiodarone     cefTRIAXone (ROCEPHIN)  IV       LOS: 2 days    Time spent: 41 minutes spent on chart review, discussion with nursing staff, consultants, updating family and interview/physical exam; more than 50% of that time was spent in counseling and/or coordination of care.    [place] Dube J [place] DO [facility] Available via Epic secure chat 7am-7pm After these hours, please refer to coverage provider listed on [email or web address] 09/27/2021, 10:44 AM

## 2021-09-27 NOTE — Progress Notes (Signed)
DAILY PROGRESS NOTE   Patient Name: Daniel Andersen Date of Encounter: 09/27/2021 Cardiologist: None  Chief Complaint   No complaints  Patient Profile   87 yo male presented with soft tissue infection and lower extremity edema, found to have high BNP of 3827 and echo showed LVEF of 25-30%  Subjective   Net negative 2.2L overnight - Creatinine is improving. Leukocytosis is improving. Went into afib overnight - still with RVR, rates into the 130's.  Objective   Vitals:   09/26/21 2028 09/27/21 0117 09/27/21 0125 09/27/21 0401  BP: 126/66 94/60  111/70  Pulse: 100 82  86  Resp: 20 20    Temp: (!) 97.5 F (36.4 C) 97.9 F (36.6 C)  97.6 F (36.4 C)  TempSrc: Axillary Axillary  Oral  SpO2: 99% 100%  97%  Weight:   80.4 kg   Height:        Intake/Output Summary (Last 24 hours) at 09/27/2021 0751 Last data filed at 09/27/2021 0535 Gross per 24 hour  Intake 760 ml  Output 3050 ml  Net -2290 ml   Filed Weights   09/25/21 1756 09/27/21 0125  Weight: 77.1 kg 80.4 kg    Physical Exam   General appearance: alert, cachectic, no distress, and pale Neck: JVD - several cm above sternal notch, no carotid bruit, and thyroid not enlarged, symmetric, no tenderness/mass/nodules Lungs: diminished breath sounds bibasilar Heart: irregularly irregular rhythm and tachycardic Abdomen: soft, non-tender; bowel sounds normal; no masses,  no organomegaly Extremities: edema 2-3+ edema with bullae, erythema Pulses: 2+ and symmetric Skin: Skin color, texture, turgor normal. No rashes or lesions Neurologic: Grossly normal Psych: Pleasant  Inpatient Medications    Scheduled Meds:  Chlorhexidine Gluconate Cloth  6 each Topical Daily   degarelix  240 mg Subcutaneous Once   furosemide  40 mg Intravenous Q12H   heparin  5,000 Units Subcutaneous Q8H   metoprolol tartrate  25 mg Oral BID   sodium chloride flush  3 mL Intravenous Q12H   vancomycin variable dose per unstable renal function  (pharmacist dosing)   Does not apply See admin instructions    Continuous Infusions:  ceFEPime (MAXIPIME) IV 1 g (09/26/21 1900)    PRN Meds: acetaminophen **OR** acetaminophen, melatonin, ondansetron **OR** ondansetron (ZOFRAN) IV, senna-docusate   Labs   Results for orders placed or performed during the hospital encounter of 09/25/21 (from the past 48 hour(s))  CBG monitoring, ED     Status: Abnormal   Collection Time: 09/25/21  6:15 PM  Result Value Ref Range   Glucose-Capillary 126 (H) 70 - 99 mg/dL    Comment: Glucose reference range applies only to samples taken after fasting for at least 8 hours.  Brain natriuretic peptide     Status: Abnormal   Collection Time: 09/25/21  6:24 PM  Result Value Ref Range   B Natriuretic Peptide 3,827.9 (H) 0.0 - 100.0 pg/mL    Comment: Performed at [facility], [place] [address] [place], [place] [address]  Culture, blood (routine x 2)     Status: None (Preliminary result)   Collection Time: 09/25/21  6:35 PM   Specimen: BLOOD  Result Value Ref Range   Specimen Description      BLOOD SITE NOT SPECIFIED Performed at [place] [address] [place], [place] [address]    Special Requests      BOTTLES DRAWN AEROBIC AND ANAEROBIC Blood Culture adequate volume Performed at [facility], [address] [place], [place] [address]    Culture      NO GROWTH < 12 HOURS Performed at [place] [address], [place], [place] [address]    Report Status PENDING   Culture, blood (routine x 2)     Status: None (Preliminary result)   Collection Time: 09/25/21  6:35 PM   Specimen: BLOOD  Result Value Ref Range   Specimen Description      BLOOD BLOOD LEFT HAND Performed at [place] [address] [place], [place] [address]    Special Requests      BOTTLES DRAWN AEROBIC AND ANAEROBIC Blood Culture results may not be optimal due to an inadequate volume  of blood received in culture bottles Performed at [facility], [place] [address], [place], [place] [address]    Culture      NO GROWTH < 12 HOURS Performed at [place] [address], [place], [place] [address]    Report Status PENDING   Comprehensive metabolic panel     Status: Abnormal   Collection Time: 09/25/21  6:35 PM  Result Value Ref Range   Sodium 134 (L) 135 - 145 mmol/L   Potassium 5.0 3.5 - 5.1 mmol/L   Chloride 100 98 - 111 mmol/L   CO2 17 (L) 22 - 32 mmol/L   Glucose, Bld 135 (H) 70 - 99 mg/dL    Comment: Glucose reference range applies only to samples taken after fasting for at least 8 hours.   BUN 115 (H) 8 - 23 mg/dL    Comment: RESULTS CONFIRMED BY MANUAL DILUTION   Creatinine, Ser 5.18 (H) 0.61 - 1.24 mg/dL   Calcium 8.4 (L) 8.9 - 10.3 mg/dL   Total Protein 7.1 6.5 - 8.1 g/dL   Albumin 3.3 (L) 3.5 - 5.0 g/dL   AST 71 (H) 15 - 41 U/L   ALT 157 (H) 0 - 44 U/L   Alkaline Phosphatase 199 (H) 38 - 126 U/L   Total Bilirubin 1.3 (H) 0.3 - 1.2 mg/dL   GFR, Estimated 10 (L) >60 mL/min    Comment: (NOTE) Calculated using the CKD-EPI Creatinine Equation (2021)    Anion gap 17 (H) 5 - 15    Comment: Performed at [facility], [place] [address] [place], [place] [address]  Lactic acid, plasma     Status: None   Collection Time: 09/25/21  6:35 PM  Result Value Ref Range   Lactic Acid, Venous 1.5 0.5 - 1.9 mmol/L    Comment: Performed at [facility], [place] [address], [place],  [address]  CBC with Differential     Status: Abnormal   Collection Time: 09/25/21  6:35 PM  Result Value Ref Range   WBC 22.6 (H) 4.0 - 10.5 K/uL   RBC 3.08 (L) 4.22 - 5.81 MIL/uL   Hemoglobin 10.6 (L) 13.0 - 17.0 g/dL   HCT 32.7 (L) 39.0 - 52.0 %   MCV 106.2 (H) 80.0 - 100.0 fL   MCH 34.4 (H) 26.0 - 34.0 pg   MCHC 32.4 30.0 - 36.0 g/dL   RDW 14.6 11.5 - 15.5 %   Platelets 180 150 - 400 K/uL   nRBC 0.0 0.0 - 0.2 %    Neutrophils Relative % 90 %   Neutro Abs 20.3 (H) 1.7 - 7.7 K/uL   Lymphocytes Relative 2 %   Lymphs Abs 0.4 (L) 0.7 - 4.0 K/uL   Monocytes  Relative 7 %   Monocytes Absolute 1.7 (H) 0.1 - 1.0 K/uL   Eosinophils Relative 0 %   Eosinophils Absolute 0.0 0.0 - 0.5 K/uL   Basophils Relative 0 %   Basophils Absolute 0.0 0.0 - 0.1 K/uL   Immature Granulocytes 1 %   Abs Immature Granulocytes 0.19 (H) 0.00 - 0.07 K/uL    Comment: Performed at [facility], [place] [address], [place], [place] [address]  Protime-INR     Status: Abnormal   Collection Time: 09/25/21  6:35 PM  Result Value Ref Range   Prothrombin Time 25.8 (H) 11.4 - 15.2 seconds   INR 2.4 (H) 0.8 - 1.2    Comment: (NOTE) INR goal varies based on device and disease states. Performed at [facility], [place] [address], [place], [place] [address]   Type and screen [place]     Status: None   Collection Time: 09/25/21  6:35 PM  Result Value Ref Range   ABO/RH(D) O POS    Antibody Screen NEG    Sample Expiration      09/28/2021,2359 Performed at [facility], [place] [address], [place], [place] [address]   Resp Panel by RT-PCR (Flu A&B, Covid) Peripheral     Status: None   Collection Time: 09/25/21  6:35 PM   Specimen: Peripheral; Nasopharyngeal(NP) swabs in vial transport medium  Result Value Ref Range   SARS Coronavirus 2 by RT PCR NEGATIVE NEGATIVE    Comment: (NOTE) SARS-CoV-2 target nucleic acids are NOT DETECTED.  The SARS-CoV-2 RNA is generally detectable in upper respiratory specimens during the acute phase of infection. The lowest concentration of SARS-CoV-2 viral copies this assay can detect is 138 copies/mL. A negative result does not preclude SARS-Cov-2 infection and should not be used as the sole basis for treatment or other patient management decisions. A negative result may occur with  improper specimen collection/handling, submission of  specimen other than nasopharyngeal swab, presence of viral mutation(s) within the areas targeted by this assay, and inadequate number of viral copies(<138 copies/mL). A negative result must be combined with clinical observations, patient history, and epidemiological information. The expected result is Negative.  Fact Sheet for Patients:  EntrepreneurPulse.com.au  Fact Sheet for Healthcare Providers:  IncredibleEmployment.be  This test is no t yet approved or cleared by the [place] FDA and  has been authorized for detection and/or diagnosis of SARS-CoV-2 by FDA under an Emergency Use Authorization (EUA). This EUA will remain  in effect (meaning this test can be used) for the duration of the COVID-19 declaration under Section 564(b)(1) of the Act, 21 U.S.C.section 360bbb-3(b)(1), unless the authorization is terminated  or revoked sooner.       Influenza A by PCR NEGATIVE NEGATIVE   Influenza B by PCR NEGATIVE NEGATIVE    Comment: (NOTE) The Xpert Xpress SARS-CoV-2/FLU/RSV plus assay is intended as an aid in the diagnosis of influenza from Nasopharyngeal swab specimens and should not be used as a sole basis for treatment. Nasal washings and aspirates are unacceptable for Xpert Xpress SARS-CoV-2/FLU/RSV testing.  Fact Sheet for Patients: EntrepreneurPulse.com.au  Fact Sheet for Healthcare Providers: IncredibleEmployment.be  This test is not yet approved or cleared by the [place] FDA and has been authorized for detection and/or diagnosis of SARS-CoV-2 by FDA under an Emergency Use Authorization (EUA). This EUA will remain in effect (meaning this test can be used) for the duration of the COVID-19 declaration under Section 564(b)(1) of the  Act, 21 U.S.C. section 360bbb-3(b)(1), unless the authorization is terminated or revoked.  Performed at [facility], [place] [address] [place], [place] [address]   Ethanol     Status: None   Collection Time: 09/25/21  6:35 PM  Result Value Ref Range   Alcohol, Ethyl (B) <10 <10 mg/dL    Comment: (NOTE) Lowest detectable limit for serum alcohol is 10 mg/dL.  For medical purposes only. Performed at [facility], [place] [address] [place], [place] [address]   Urinalysis, Routine w reflex microscopic Urine, Clean Catch     Status: Abnormal   Collection Time: 09/25/21  8:11 PM  Result Value Ref Range   Color, Urine YELLOW YELLOW   APPearance CLEAR CLEAR   Specific Gravity, Urine 1.015 1.005 - 1.030   pH 5.5 5.0 - 8.0   Glucose, UA NEGATIVE NEGATIVE mg/dL   Hgb urine dipstick NEGATIVE NEGATIVE   Bilirubin Urine NEGATIVE NEGATIVE   Ketones, ur NEGATIVE NEGATIVE mg/dL   Protein, ur NEGATIVE NEGATIVE mg/dL   Nitrite NEGATIVE NEGATIVE   Leukocytes,Ua TRACE (A) NEGATIVE   Bacteria, UA RARE (A) NONE SEEN   Mucus PRESENT     Comment: Performed at [facility], [place] [address], [place], [place] [address]  Sodium, urine, random     Status: None   Collection Time: 09/25/21  8:11 PM  Result Value Ref Range   Sodium, Ur <10 mmol/L    Comment: Performed at [facility], [place] [address], [place], [place] [address]  Creatinine, urine, random     Status: None   Collection Time: 09/25/21  8:11 PM  Result Value Ref Range   Creatinine, Urine 78.96 mg/dL    Comment: Performed at [facility], [place] [address], [place], [place] [address]  Protime-INR     Status: Abnormal   Collection Time: 09/26/21  6:05 AM  Result Value Ref Range   Prothrombin Time 23.4 (H) 11.4 - 15.2 seconds   INR 2.1 (H) 0.8 - 1.2    Comment: (NOTE) INR goal varies based on device and disease states. Performed at [facility], [place] [address] [place], [place] [address]   Procalcitonin     Status: None   Collection Time: 09/26/21  6:05 AM  Result Value Ref Range    Procalcitonin 6.98 ng/mL    Comment:        Interpretation: PCT > 2 ng/mL: Systemic infection (sepsis) is likely, unless other causes are known. (NOTE)       Sepsis PCT Algorithm           Lower Respiratory Tract                                      Infection PCT Algorithm    ----------------------------     ----------------------------         PCT < 0.25 ng/mL                PCT < 0.10 ng/mL          Strongly encourage             Strongly discourage   discontinuation of antibiotics    initiation of antibiotics    ----------------------------     -----------------------------       PCT 0.25 - 0.50 ng/mL  PCT 0.10 - 0.25 ng/mL               OR       >80% decrease in PCT            Discourage initiation of                                            antibiotics      Encourage discontinuation           of antibiotics    ----------------------------     -----------------------------         PCT >= 0.50 ng/mL              PCT 0.26 - 0.50 ng/mL               AND       <80% decrease in PCT              Encourage initiation of                                             antibiotics       Encourage continuation           of antibiotics    ----------------------------     -----------------------------        PCT >= 0.50 ng/mL                  PCT > 0.50 ng/mL               AND         increase in PCT                  Strongly encourage                                      initiation of antibiotics    Strongly encourage escalation           of antibiotics                                     -----------------------------                                           PCT <= 0.25 ng/mL                                                 OR                                        > 80% decrease in PCT  Discontinue / Do not initiate                                             antibiotics  Performed at [place] [address], [place], [place] [address]   Magnesium     Status: None   Collection Time: 09/26/21  6:05 AM  Result Value Ref Range   Magnesium 2.2 1.7 - 2.4 mg/dL    Comment: Performed at [facility], [place] [address], [place], [place] [address]  Comprehensive metabolic panel     Status: Abnormal   Collection Time: 09/26/21  6:05 AM  Result Value Ref Range   Sodium 134 (L) 135 - 145 mmol/L   Potassium 4.4 3.5 - 5.1 mmol/L   Chloride 103 98 - 111 mmol/L   CO2 15 (L) 22 - 32 mmol/L   Glucose, Bld 108 (H) 70 - 99 mg/dL    Comment: Glucose reference range applies only to samples taken after fasting for at least 8 hours.   BUN 128 (H) 8 - 23 mg/dL    Comment: RESULTS CONFIRMED BY MANUAL DILUTION   Creatinine, Ser 4.93 (H) 0.61 - 1.24 mg/dL   Calcium 8.2 (L) 8.9 - 10.3 mg/dL   Total Protein 6.3 (L) 6.5 - 8.1 g/dL   Albumin 2.6 (L) 3.5 - 5.0 g/dL   AST 56 (H) 15 - 41 U/L   ALT 128 (H) 0 - 44 U/L   Alkaline Phosphatase 171 (H) 38 - 126 U/L   Total Bilirubin 1.2 0.3 - 1.2 mg/dL   GFR, Estimated 11 (L) >60 mL/min    Comment: (NOTE) Calculated using the CKD-EPI Creatinine Equation (2021)    Anion gap 16 (H) 5 - 15    Comment: Performed at [facility], [place] [address] [place], [place] [address]  Vitamin B12     Status: Abnormal   Collection Time: 09/26/21  6:05 AM  Result Value Ref Range   Vitamin B-12 1,404 (H) 180 - 914 pg/mL    Comment: RESULTS CONFIRMED BY MANUAL DILUTION (NOTE) This assay is not validated for testing neonatal or myeloproliferative syndrome specimens for Vitamin B12 levels. Performed at [facility], [place] [address], [place], [place] [address]   Folate     Status: None   Collection Time: 09/26/21  6:05 AM  Result Value Ref Range   Folate 32.3 >5.9 ng/mL    Comment: RESULTS CONFIRMED BY MANUAL DILUTION Performed at [place] [address], [place], [place] [address]   Iron and TIBC     Status:  Abnormal   Collection Time: 09/26/21  6:05 AM  Result Value Ref Range   Iron 15 (L) 45 - 182 ug/dL   TIBC 176 (L) 250 - 450 ug/dL   Saturation Ratios 9 (L) 17.9 - 39.5 %   UIBC 161 ug/dL    Comment: Performed at [facility], [place] [address] [place], [place] [address]  Ferritin     Status: None   Collection Time: 09/26/21  6:05 AM  Result Value Ref Range   Ferritin 322 24 - 336 ng/mL    Comment: Performed at [facility], [place] [address] [place], [place] [address]  PSA     Status: Abnormal   Collection Time: 09/26/21  6:05 AM  Result Value Ref  Range   Prostatic Specific Antigen 246.08 (H) 0.00 - 4.00 ng/mL    Comment: RESULTS CONFIRMED BY MANUAL DILUTION (NOTE) While PSA levels of <=4.0 ng/ml are reported as reference range, some men with levels below 4.0 ng/ml can have prostate cancer and many men with PSA above 4.0 ng/ml do not have prostate cancer.  Other tests such as free PSA, age specific reference ranges, PSA velocity and PSA doubling time may be helpful especially in men less than 42 years old. Performed at [facility], [place] [address] [place], [place] [address]   Hepatitis panel, acute     Status: None   Collection Time: 09/26/21  6:05 AM  Result Value Ref Range   Hepatitis B Surface Ag NON REACTIVE NON REACTIVE   HCV Ab NON REACTIVE NON REACTIVE    Comment: (NOTE) Nonreactive HCV antibody screen is consistent with no HCV infections,  unless recent infection is suspected or other evidence exists to indicate HCV infection.     Hep A IgM NON REACTIVE NON REACTIVE   Hep B C IgM NON REACTIVE NON REACTIVE    Comment: Performed at [facility], [place] [address] [place], [place] [address]  MRSA Next Gen by PCR, Nasal     Status: None   Collection Time: 09/26/21  2:22 PM   Specimen: Nasal Mucosa; Nasal Swab  Result Value Ref Range   MRSA by PCR Next Gen NOT DETECTED NOT DETECTED    Comment: (NOTE) The  GeneXpert MRSA Assay (FDA approved for NASAL specimens only), is one component of a comprehensive MRSA colonization surveillance program. It is not intended to diagnose MRSA infection nor to guide or monitor treatment for MRSA infections. Test performance is not FDA approved in patients less than 28 years old. Performed at [facility], [place] [address] [place], [place] [address]   Glucose, capillary     Status: Abnormal   Collection Time: 09/26/21  8:49 PM  Result Value Ref Range   Glucose-Capillary 107 (H) 70 - 99 mg/dL    Comment: Glucose reference range applies only to samples taken after fasting for at least 8 hours.  Vancomycin, random     Status: None   Collection Time: 09/27/21  5:36 AM  Result Value Ref Range   Vancomycin Rm 16     Comment:        Random Vancomycin therapeutic range is dependent on dosage and time of specimen collection. A peak range is 20.0-40.0 ug/mL A trough range is 5.0-15.0 ug/mL        Performed at [place] [address], [place], [place] [address]   CBC     Status: Abnormal   Collection Time: 09/27/21  5:36 AM  Result Value Ref Range   WBC 17.2 (H) 4.0 - 10.5 K/uL   RBC 2.91 (L) 4.22 - 5.81 MIL/uL   Hemoglobin 9.8 (L) 13.0 - 17.0 g/dL   HCT 29.3 (L) 39.0 - 52.0 %   MCV 100.7 (H) 80.0 - 100.0 fL   MCH 33.7 26.0 - 34.0 pg   MCHC 33.4 30.0 - 36.0 g/dL   RDW 13.9 11.5 - 15.5 %   Platelets 132 (L) 150 - 400 K/uL   nRBC 0.0 0.0 - 0.2 %    Comment: Performed at [facility], [place] [address] [place], [place] [address]  Comprehensive metabolic panel     Status: Abnormal   Collection Time: 09/27/21  5:36 AM  Result  Value Ref Range   Sodium 134 (L) 135 - 145 mmol/L   Potassium 3.5 3.5 - 5.1 mmol/L    Comment: DELTA CHECK NOTED NO VISIBLE HEMOLYSIS    Chloride 103 98 - 111 mmol/L   CO2 17 (L) 22 - 32 mmol/L   Glucose, Bld 115 (H) 70 - 99 mg/dL    Comment: Glucose reference range  applies only to samples taken after fasting for at least 8 hours.   BUN 245 (H) 8 - 23 mg/dL    Comment: RESULTS CONFIRMED BY MANUAL DILUTION   Creatinine, Ser 4.79 (H) 0.61 - 1.24 mg/dL   Calcium 7.6 (L) 8.9 - 10.3 mg/dL   Total Protein 5.7 (L) 6.5 - 8.1 g/dL   Albumin 2.4 (L) 3.5 - 5.0 g/dL   AST 45 (H) 15 - 41 U/L   ALT 102 (H) 0 - 44 U/L   Alkaline Phosphatase 140 (H) 38 - 126 U/L   Total Bilirubin 0.8 0.3 - 1.2 mg/dL   GFR, Estimated 11 (L) >60 mL/min    Comment: (NOTE) Calculated using the CKD-EPI Creatinine Equation (2021)    Anion gap 14 5 - 15    Comment: Performed at [facility], [place] [address], [place], [place] [address]  Magnesium     Status: None   Collection Time: 09/27/21  5:36 AM  Result Value Ref Range   Magnesium 2.0 1.7 - 2.4 mg/dL    Comment: Performed at [facility], [place] [address] [place], [place] [address]  Procalcitonin     Status: None   Collection Time: 09/27/21  5:36 AM  Result Value Ref Range   Procalcitonin 6.00 ng/mL    Comment:        Interpretation: PCT > 2 ng/mL: Systemic infection (sepsis) is likely, unless other causes are known. (NOTE)       Sepsis PCT Algorithm           Lower Respiratory Tract                                      Infection PCT Algorithm    ----------------------------     ----------------------------         PCT < 0.25 ng/mL                PCT < 0.10 ng/mL          Strongly encourage             Strongly discourage   discontinuation of antibiotics    initiation of antibiotics    ----------------------------     -----------------------------       PCT 0.25 - 0.50 ng/mL            PCT 0.10 - 0.25 ng/mL               OR       >80% decrease in PCT            Discourage initiation of                                            antibiotics      Encourage discontinuation           of antibiotics    ----------------------------     -----------------------------  PCT >= 0.50  ng/mL              PCT 0.26 - 0.50 ng/mL               AND       <80% decrease in PCT              Encourage initiation of                                             antibiotics       Encourage continuation           of antibiotics    ----------------------------     -----------------------------        PCT >= 0.50 ng/mL                  PCT > 0.50 ng/mL               AND         increase in PCT                  Strongly encourage                                      initiation of antibiotics    Strongly encourage escalation           of antibiotics                                     -----------------------------                                           PCT <= 0.25 ng/mL                                                 OR                                        > 80% decrease in PCT                                      Discontinue / Do not initiate                                             antibiotics  Performed at [place] [address], [place], [place] [address]   Glucose, capillary     Status: Abnormal   Collection Time: 09/27/21  7:26 AM  Result Value Ref Range   Glucose-Capillary 111 (H) 70 - 99 mg/dL    Comment: Glucose reference range applies only to samples taken after fasting for at least 8  hours.    ECG   N/A  Telemetry   AFib with RVR - Personally Reviewed  Radiology    DG Chest 1 View  Result Date: 09/25/2021 CLINICAL DATA:  Dyspnea EXAM: CHEST  1 VIEW COMPARISON:  03/21/2013 FINDINGS: Cardiomegaly with vascular congestion, pulmonary edema and bilateral effusions. Bibasilar consolidations. Aortic atherosclerosis. No pneumothorax. Possible tracheal deviation to the right. Multifocal osseous sclerosis is suspicious for metastatic disease. IMPRESSION: 1. Cardiomegaly with vascular congestion, pulmonary edema and bilateral effusions. Bibasilar consolidations consistent with atelectasis or pneumonia 2. Multifocal osseous sclerosis,  suspicious for metastatic disease. 3. Suspect tracheal deviation to the right, left paratracheal mass cannot be excluded. Suggest correlation with chest CT. Electronically Signed   By: Donavan Foil M.D.   On: 09/25/2021 20:03   DG Tibia/Fibula Right  Result Date: 09/25/2021 CLINICAL DATA:  Lower extremity wound EXAM: RIGHT TIBIA AND FIBULA - 2 VIEW COMPARISON:  None. FINDINGS: No fracture or malalignment. Diffuse soft tissue swelling. Vascular calcifications. No soft tissue emphysema IMPRESSION: No acute osseous abnormality Electronically Signed   By: Donavan Foil M.D.   On: 09/25/2021 20:05   CT CHEST WO CONTRAST  Result Date: 09/25/2021 CLINICAL DATA:  Evaluate pleural effusion EXAM: CT CHEST WITHOUT CONTRAST TECHNIQUE: Multidetector CT imaging of the chest was performed following the standard protocol without IV contrast. COMPARISON:  Chest x-ray from earlier in the same day FINDINGS: Cardiovascular: Atherosclerotic calcifications are noted without aneurysmal dilatation. Coronary calcifications are seen. No cardiac enlargement is noted. No pericardial effusion is seen. Pulmonary artery is not significantly enlarged. Mediastinum/Nodes: Thoracic inlet is within normal limits. No sizable hilar or mediastinal adenopathy is noted. The esophagus as visualized is within normal limits. Lungs/Pleura: Bilateral large pleural effusions are noted. Associated basilar atelectasis is noted. Lungs are otherwise well aerated without focal infiltrate or parenchymal nodules. Upper Abdomen: Visualized upper abdomen demonstrates fullness of the renal pelves bilaterally of uncertain significance/chronicity. Musculoskeletal: Multiple sclerotic lesions are identified throughout the visualized bony skeleton consistent with metastatic disease. This raises suspicion for prostate carcinoma. IMPRESSION: Changes consistent with bony metastatic disease likely related to prostate carcinoma. Further workup is recommended. Bilateral  pleural effusions with lower lobe atelectasis. No focal mass lesion is noted. Fullness in the collecting systems of the kidneys bilaterally which may be related to bladder outlet obstruction but incompletely evaluated on this exam. Aortic Atherosclerosis (ICD10-I70.0). Electronically Signed   By: Inez Catalina M.D.   On: 09/25/2021 20:41   US RENAL  Result Date: 09/25/2021 CLINICAL DATA:  Acute renal failure EXAM: RENAL / URINARY TRACT ULTRASOUND COMPLETE COMPARISON:  None. FINDINGS: Right Kidney: Renal measurements: 11.5 x 4.7 x 4.2 cm. = volume: 18 mL. Significant hydronephrosis is noted. Continues into the proximal right ureter. Left Kidney: Renal measurements: 11.5 x 5.7 x 4.9 cm. = volume: 169 mL. Significant hydronephrosis is noted similar to that seen on the right. 1.5 cm cyst is noted inferiorly. Bladder: Bladder is well distended. Some mild irregularity of the posterior bladder wall is noted without discrete mass. May represent increased trabeculation possibly related to bladder outlet obstruction. Distal ureters appear dilated. Note is made of a left-sided pleural effusion. Other: None. IMPRESSION: Significant hydronephrosis bilaterally which extends to the level of the urinary bladder. Findings suggestive of bladder outlet obstruction are noted. Repeat imaging following decompression of the bladder may be helpful as clinically indicated. Left pleural effusion. Left renal cyst. Electronically Signed   By: Inez Catalina M.D.   On: 09/25/2021 22:20   DG Foot  Complete Right  Result Date: 09/25/2021 CLINICAL DATA:  Right lower extremity wound infection EXAM: RIGHT FOOT COMPLETE - 3+ VIEW COMPARISON:  None. FINDINGS: No fracture or malalignment. 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 Diffuse soft tissue swelling without emphysema. Vascular calcifications. Small plantar calcaneal spur. IMPRESSION: No acute osseous abnormality. Electronically Signed   By: Donavan Foil M.D.   On: 09/25/2021 20:04    ECHOCARDIOGRAM COMPLETE  Result Date: 09/26/2021    ECHOCARDIOGRAM REPORT   Patient Name:   Daniel Andersen Date of Exam: 09/26/2021 Medical Rec #:  [id number]       Height:       68.0 in Accession #:    [id number]      Weight:       170.0 lb Date of Birth:  June 24, 1932       BSA:          1.907 m Patient Age:    62 years        BP:           122/72 mmHg Patient Gender: M               HR:           141 bpm. Exam Location:  Inpatient Procedure: 2D Echo, Color Doppler, Cardiac Doppler and Intracardiac            Opacification Agent Indications:     CHF  History:         Patient has no prior history of Echocardiogram examinations.                  Risk Factors:Hypertension.  Sonographer:     Jyl Heinz Referring Phys:  [id number] Cleaster Corin PATEL Diagnosing Phys: Gwyndolyn Kaufman MD IMPRESSIONS  1. Left ventricular ejection fraction, by estimation, is 25 to 30%. The left ventricle has severely decreased function. The left ventricle demonstrates global hypokinesis. There is mild concentric left ventricular hypertrophy. Left ventricular diastolic  parameters are indeterminate. Elevated left atrial pressure.  2. Right ventricular systolic function is severely reduced. The right ventricular size is normal.  3. Left atrial size was mildly dilated.  4. Moderate pleural effusion in the left lateral region.  5. The mitral valve is degenerative. Trivial mitral valve regurgitation. Moderate mitral annular calcification.  6. The aortic valve is tricuspid. There is mild calcification of the aortic valve. There is mild thickening of the aortic valve. Aortic valve regurgitation is trivial. Aortic valve sclerosis is present, with no evidence of aortic valve stenosis.  7. The inferior vena cava is dilated in size with <50% respiratory variability, suggesting right atrial pressure of 15 mmHg.  8. Tachycardic during study with HR 130s with no discernable p-waves seen (possible Afib) Comparison(s): No prior Echocardiogram. FINDINGS   Left Ventricle: Left ventricular ejection fraction, by estimation, is 25 to 30%. The left ventricle has severely decreased function. The left ventricle demonstrates global hypokinesis. The left ventricular internal cavity size was normal in size. There is mild concentric left ventricular hypertrophy. Left ventricular diastolic parameters are indeterminate. Elevated left atrial pressure. Right Ventricle: The right ventricular size is normal. No increase in right ventricular wall thickness. Right ventricular systolic function is severely reduced. Left Atrium: Left atrial size was mildly dilated. Right Atrium: Right atrial size was normal in size. Pericardium: There is no evidence of pericardial effusion. Mitral Valve: The mitral valve is degenerative in appearance. There is mild thickening of the mitral valve leaflet(s). There is mild calcification of  the mitral valve leaflet(s). Moderate mitral annular calcification. Trivial mitral valve regurgitation. Tricuspid Valve: The tricuspid valve is normal in structure. Tricuspid valve regurgitation is trivial. Aortic Valve: The aortic valve is tricuspid. There is mild calcification of the aortic valve. There is mild thickening of the aortic valve. Aortic valve regurgitation is trivial. Aortic valve sclerosis is present, with no evidence of aortic valve stenosis. Aortic valve peak gradient measures 5.6 mmHg. Pulmonic Valve: The pulmonic valve was normal in structure. Pulmonic valve regurgitation is trivial. Aorta: The aortic root and ascending aorta are structurally normal, with no evidence of dilitation. Venous: The inferior vena cava is dilated in size with less than 50% respiratory variability, suggesting right atrial pressure of 15 mmHg. IAS/Shunts: No atrial level shunt detected by color flow Doppler. Additional Comments: There is a moderate pleural effusion in the left lateral region.  LEFT VENTRICLE PLAX 2D LVIDd:         4.90 cm      Diastology LVIDs:         4.00 cm       LV e' medial:    5.26 cm/s LV PW:         1.10 cm      LV E/e' medial:  20.2 LV IVS:        1.10 cm      LV e' lateral:   7.07 cm/s LVOT diam:     2.00 cm      LV E/e' lateral: 15.0 LV SV:         30 LV SV Index:   16 LVOT Area:     3.14 cm  LV Volumes (MOD) LV vol d, MOD A2C: 140.0 ml LV vol d, MOD A4C: 135.0 ml LV vol s, MOD A2C: 101.0 ml LV vol s, MOD A4C: 89.8 ml LV SV MOD A2C:     39.0 ml LV SV MOD A4C:     135.0 ml LV SV MOD BP:      44.9 ml RIGHT VENTRICLE            IVC RV Basal diam:  3.60 cm    IVC diam: 2.20 cm RV Mid diam:    3.10 cm RV S prime:     6.92 cm/s TAPSE (M-mode): 0.7 cm LEFT ATRIUM             Index        RIGHT ATRIUM           Index LA diam:        4.10 cm 2.15 cm/m   RA Area:     16.60 cm LA Vol (A2C):   70.3 ml 36.86 ml/m  RA Volume:   42.00 ml  22.02 ml/m LA Vol (A4C):   66.0 ml 34.60 ml/m LA Biplane Vol: 71.6 ml 37.54 ml/m  AORTIC VALVE                 PULMONIC VALVE AV Area (Vmax): 1.86 cm     PR End Diast Vel: 1.56 msec AV Vmax:        118.00 cm/s AV Peak Grad:   5.6 mmHg LVOT Vmax:      70.00 cm/s LVOT Vmean:     49.900 cm/s LVOT VTI:       0.094 m  AORTA Ao Root diam: 2.90 cm Ao Asc diam:  3.20 cm MITRAL VALVE                TRICUSPID VALVE MV Area (  PHT): 5.84 cm     TR Peak grad:   19.7 mmHg MV Decel Time: 130 msec     TR Vmax:        222.00 cm/s MV E velocity: 106.00 cm/s                             SHUNTS                             Systemic VTI:  0.09 m                             Systemic Diam: 2.00 cm Gwyndolyn Kaufman MD Electronically signed by Gwyndolyn Kaufman MD Signature Date/Time: 09/26/2021/10:58:34 AM    Final (Updated)    US Abdomen Limited RUQ (LIVER/GB)  Result Date: 09/25/2021 CLINICAL DATA:  Elevated LFT EXAM: ULTRASOUND ABDOMEN LIMITED RIGHT UPPER QUADRANT COMPARISON:  None. FINDINGS: Gallbladder: No gallstones or wall thickening visualized. No sonographic Murphy sign noted by sonographer. Common bile duct: Diameter: 4 mm Liver: No focal lesion  identified. Within normal limits in parenchymal echogenicity. Portal vein is patent on color Doppler imaging with normal direction of blood flow towards the liver. Other: Right pleural effusion. Incompletely evaluated right hydronephrosis IMPRESSION: 1. Ultrasound appearance of the liver is within normal limits. 2. Right pleural effusion 3. Incompletely visualized right hydronephrosis Electronically Signed   By: Donavan Foil M.D.   On: 09/25/2021 22:23    Cardiac Studies   See echo above  Assessment   Principal Problem:   Sepsis due to cellulitis [facility]) Active Problems:   Essential hypertension   Acute renal failure (ARF) (HCC)   Macrocytic anemia   Pleural effusion, bilateral   Plan   Good diuresis overnight - would continue IV diuretics, creatinine is improving. Unfortunately he went into afib with RVR overnight - will start IV amiodarone given low EF for rate control. Will need to consider heparinization if he persists in afib more than 48 hours.  Time Spent Directly with Patient:  I have spent a total of 35 minutes with the patient reviewing hospital notes, telemetry, EKGs, labs and examining the patient as well as establishing an assessment and plan that was discussed personally with the patient.  > 50% of time was spent in direct patient care.  Length of Stay:  LOS: 2 days   Pixie Casino, MD, [facility], [place] Director of the [facility] &  Card[facility] Diplomate of the American Board of Clinical Lipidology Attending Cardiologist  Direct Dial: [phone]  Fax: [phone]  Website:  [email or web address] Adasha Boehme 09/27/2021, 7:51 AM

## 2021-09-28 DIAGNOSIS — I480 Paroxysmal atrial fibrillation: Secondary | ICD-10-CM

## 2021-09-28 DIAGNOSIS — I420 Dilated cardiomyopathy: Secondary | ICD-10-CM

## 2021-09-28 LAB — COMPREHENSIVE METABOLIC PANEL
ALT: 95 U/L — ABNORMAL HIGH (ref 0–44)
AST: 52 U/L — ABNORMAL HIGH (ref 15–41)
Albumin: 2.4 g/dL — ABNORMAL LOW (ref 3.5–5.0)
Alkaline Phosphatase: 151 U/L — ABNORMAL HIGH (ref 38–126)
Anion gap: 17 — ABNORMAL HIGH (ref 5–15)
BUN: 231 mg/dL — ABNORMAL HIGH (ref 8–23)
CO2: 17 mmol/L — ABNORMAL LOW (ref 22–32)
Calcium: 7.3 mg/dL — ABNORMAL LOW (ref 8.9–10.3)
Chloride: 98 mmol/L (ref 98–111)
Creatinine, Ser: 4.55 mg/dL — ABNORMAL HIGH (ref 0.61–1.24)
GFR, Estimated: 12 mL/min — ABNORMAL LOW (ref >60)
Glucose, Bld: 134 mg/dL — ABNORMAL HIGH (ref 70–99)
Potassium: 3.4 mmol/L — ABNORMAL LOW (ref 3.5–5.1)
Sodium: 132 mmol/L — ABNORMAL LOW (ref 135–145)
Total Bilirubin: 0.6 mg/dL (ref 0.3–1.2)
Total Protein: 6.1 g/dL — ABNORMAL LOW (ref 6.5–8.1)

## 2021-09-28 LAB — CBC
HCT: 30.2 % — ABNORMAL LOW (ref 39.0–52.0)
Hemoglobin: 10.1 g/dL — ABNORMAL LOW (ref 13.0–17.0)
MCH: 33.9 pg (ref 26.0–34.0)
MCHC: 33.4 g/dL (ref 30.0–36.0)
MCV: 101.3 fL — ABNORMAL HIGH (ref 80.0–100.0)
Platelets: 142 10*3/uL — ABNORMAL LOW (ref 150–400)
RBC: 2.98 MIL/uL — ABNORMAL LOW (ref 4.22–5.81)
RDW: 13.7 % (ref 11.5–15.5)
WBC: 14.3 10*3/uL — ABNORMAL HIGH (ref 4.0–10.5)
nRBC: 0.3 % — ABNORMAL HIGH (ref 0.0–0.2)

## 2021-09-28 LAB — PROCALCITONIN: Procalcitonin: 4.54 ng/mL

## 2021-09-28 MED ORDER — MIDODRINE HCL 5 MG PO TABS
5.0000 mg | ORAL_TABLET | Freq: Three times a day (TID) | ORAL | Status: DC
  Administered 2021-09-28 – 2021-10-05 (×20): 5 mg via ORAL
  Filled 2021-09-28 (×20): qty 1

## 2021-09-28 MED ORDER — ALBUMIN HUMAN 25 % IV SOLN
25.0000 g | Freq: Once | INTRAVENOUS | Status: AC
  Administered 2021-09-28: 25 g via INTRAVENOUS
  Filled 2021-09-28: qty 100

## 2021-09-28 NOTE — Progress Notes (Signed)
Pt BP 84/50 manually, HR 106, resp 22. MD notified. New orders received. Will continue to monitor.

## 2021-09-28 NOTE — Progress Notes (Signed)
OT Cancellation Note  Patient Details Name: Daniel Andersen MRN: [id number] DOB: 1931-12-22   Cancelled Treatment:    Reason Eval/Treat Not Completed: Medical issues which prohibited therapy Patient continues to have increased HR at rest with Afib. Nursing is asking for therapy to hold off. OT to continue to follow and check back as schedule will allow.  Jackelyn Poling OTR/L, [place] [facility] Department Office# [phone] Pager# [phone]   09/28/2021, 11:50 AM

## 2021-09-28 NOTE — Progress Notes (Addendum)
PROGRESS NOTE    Daniel Andersen  VPX:[id number] DOB: [birth year] DOA: 09/25/2021 PCP: Carlena Hurl, PA-C    Brief Narrative:  Daniel Andersen is a 87 year old male with past medical history significant for essential hypertension, and alcohol use who presented to [facility] ED on 12/7 from home via EMS due to altered mental status and worsening right lower extremity wound since Thanksgiving.  Patient has also reported worsening lower extremity edema with associated erythema since this timeframe as well.  Patient denies dysuria, no chest pain, no shortness of breath, no cough/congestion, no nausea/vomiting/diarrhea, no abdominal pain.  In the ED, BP 137/73, HR 99, RR 26, temperature 96.3 F, SPO2 96% on room air.  Sodium 134, potassium 5.0, chloride 100, CO2 17, glucose 135, BUN 115, creatinine 5.18, alkaline phosphatase 199, AST 71, ALT 157, total bilirubin 1.3.  BNP 3000 and urine 27.9.  Lactic acid 1.5.  WBC 22.6.  Hemoglobin 10.6, platelet count 180.  COVID-19 PCR negative.  Influenza A/B PCR negative.  EtOH level less than 10.  Chest x-ray with cardiomegaly, vascular congestion, pulmonary edema and bilateral effusions.  Right foot and tibia/fibula x-ray with no acute osseous abnormality.  Renal ultrasound with significant hydronephrosis bilaterally extending to the level of the urinary bladder suggestive of bladder outlet obstruction.  CT chest without contrast showed changes consistent with bony metastatic disease likely related to prostate carcinoma, bilateral pleural effusions with lower lobe atelectasis with no focal mass lesion noted.  Patient was started on IV vancomycin and cefepime and received 500 cc normal saline.  Hospital service consulted for further evaluation and management.   Assessment & Plan:   Principal Problem:   Sepsis due to cellulitis [facility]) Active Problems:   Essential hypertension   Acute renal failure (ARF) (HCC)   Macrocytic anemia   Pleural effusion,  bilateral   Addendum 1515: Paged by RN regarding drop in blood pressure.  Went to bedside, discussed with patient and daughter who is present now.  MAP currently greater than 65. Ultimately very poor prognosis given multiple insults including renal failure, obstructive uropathy, metastatic prostate cancer, decompensated systolic congestive heart failure and sepsis secondary to cellulitis.  Patient remains alert and oriented.  Discussed once again CODE STATUS, remains full code per his and his daughter's wishes.  Will give IV albumin and started on midodrine 5 mg p.o. 3 times daily.  Sepsis, POA Right lower extremity cellulitis Patient presenting to the ED with leukocytosis, respiratory rate greater than 20, HR greater than 90 and significant right lower extremity cellulitis with large intact bullae present at the base of the right first toe.  WBC count elevated 22.6 with procalcitonin 6.98.  MRSA PCR negative.  Initially started on empiric antibiotics with vancomycin and cefepime, now transitioned to ceftriaxone. --WBC 22.6>17.2>14.3 --Procal 6.98>6.00>4.54 --Blood cultures x2: No growth x 3 days --Ceftriaxone 2 g IV q24h --CBC daily  Acute renal failure Anion gap metabolic acidosis Obstructive uropathy Patient presenting with a creatinine of 5.18. FeNa = 0.5%; which is consistent with a prerenal etiology.  Additionally, patient with likely chronic outlet obstruction 2/2 BPH with likely prostate cancer complicating picture.  Renal ultrasound with significant hydronephrosis bilaterally extending to the level of the urinary bladder suggestive of bladder outlet obstruction. Foley catheter was placed.  CT abdomen/pelvis 12/9 with moderate bilateral hydroureteronephrosis to the level of UVJ. --Urology following, appreciate assistance --BUN 115>128>245>231 --Cr 5.18>4.93>4.79>4.55 --Continue Foley catheter --Urine output 256mL past 24 hours --Strict I's and O's --Avoid nephrotoxins, renally dose  all medications --Monitor renal function daily -- N.p.o. after midnight for possible need of ureteral stent placement by urology tomorrow  Systolic congestive heart failure, new diagnosis with decompensation Patient with large bilateral pleural effusions, significant peripheral edema, and BNP 3827.9.  TTE with LVEF 25-30%, LV severely decreased function with global hypokinesis, mild concentric LVH, LV diastolic parameters indeterminate, LA mildly dilated, RV systolic function severely reduced, moderate pleural effusion left lateral region, trivial MR, IVC dilated. --Cardiology following, appreciate assistance --net negative  + 1L past 24h and net positive 28mL since admission --Metoprolol tartrate 25 mg p.o. twice daily --Furosemide 40 mg IV q12h --Fluid restriction 1200 mL/day --Strict I's and O's and daily weights  Atrial fibrillation with RVR TSH: 1.541 --Metoprolol tartrate 25 mg p.o. twice daily --amiodarone drip today -- Heparin 5000u Timpson q8h - started 10/10 --Continue monitor on telemetry  Osseous metastatic lesions concerning for prostate cancer as primary Several osseous lesions noted on imaging, elevated alkaline phosphatase, andelevated PSA 246.08; highly concerning for prostate cancer.  CT abdomen/pelvis with multifocal sclerotic osseous metastatic lesions of the spine likely secondary to underlying prostate cancer. --Urology following --Started on androgen deprivation therapy 12/8 --Palliative care consulted for assistance with goals of care and medical decision making.  Macrocytic anemia Hemoglobin 10.6 with MCV 106.2 on admission.  Anemia panel with iron 15, TIBC 176, ferritin 322, B12 1404, folate 32.3.  Etiology likely secondary to anemia of chronic medical disease.  Elevated LFTs Etiology likely multifactorial, related to sepsis, congestive hepatopathy.  Also with elevated alkaline phosphatase, likely related to osseous lesions from suspected prostate cancer.  EtOH  level less than 10.  Right upper quadrant ultrasound with no focal liver lesion identified, parenchymal echogenicity density within normal limits, portal vein patent.  Alcohol cessation.  Acute hepatitis panel negative. --AST 71>56>45>52 --ALT 157>128>102>95 --Avoid hepatotoxins --CMP daily  Alcohol use disorder Patient reports drinking 1-2 beers daily, denies any use within the last week.  Counseled on need for cessation --CIWA protocol  DVT prophylaxis: heparin injection 5,000 Units Start: 09/25/21 2200   Code Status: Full Code Family Communication: No family present at bedside this morning Disposition Plan:  Level ofcare: Telemetry Status is: Inpatient  Remains inpatient appropriate because: Continues on IV antibiotics for sepsis related to severe cellulitis right lower extremity, on IV diuresis for new diagnosis of systolic congestive heart failure, amiodarone drip for A. fib with RVR.     Consultants:  Urology Cardiology Palliative care  Procedures:  Foley catheter placement 12/7 TTE  Antimicrobials:  Vancomycin 12/7 - 12/9 Cefepime 12/7 - 12/9 Ceftriaxone 12/9>>   Subjective: Patient seen examined at bedside, lying in bed.  No family present.  Patient somnolence, awake but falls back asleep.  Nursing reports very poor sleep overnight.  Also with episode of hypotension and patient's metoprolol and furosemide were held overnight.  Patient continues to be tachycardic and remains on amiodarone drip.  Seen by cardiology this morning.  Discussed case with urology as well.  Creatinine slightly improved but CT abdomen/pelvis yesterday with moderate bilateral hydroureteronephrosis at the level of UVJ.  Creatinine continues to slowly improve but with decreased urine output past 24 hours.  Appears likely poor prognosis given continued decline and will consult palliative care for assistance with goals of care and medical decision making.  Denies headache, no visual changes, no chest  pain, no shortness of breath, no abdominal pain.  No acute events other than accelerated heart rate overnight per nursing staff.   Objective: Vitals:  09/28/21 0500 09/28/21 0600 09/28/21 0610 09/28/21 0900  BP: (!) 84/61 97/67 (!) 107/96   Pulse:   (!) 129   Resp: 20 20 18    Temp:    (!) 97.4 F (36.3 C)  TempSrc:      SpO2: 99% 100% 98%   Weight:      Height:        Intake/Output Summary (Last 24 hours) at 09/28/2021 1043 Last data filed at 09/28/2021 0500 Gross per 24 hour  Intake 1302.96 ml  Output 250 ml  Net 1052.96 ml   Filed Weights   09/25/21 1756 09/27/21 0125 09/28/21 0339  Weight: 77.1 kg 80.4 kg 77.7 kg    Examination:  General exam: Appears calm and comfortable, appears older than stated age, chronically ill in appearance, somnolent this morning Respiratory system: Breath sounds slightly decreased bilateral bases, mild crackles, no wheezing, normal respiratory effort without accessory muscle use, on room air with SPO2 97% at rest Cardiovascular system: S1 & S2 heard, tachycardic, irregularly irregular rhythm. No JVD, murmurs, rubs, gallops or clicks.  Gastrointestinal system: Abdomen is nondistended, soft and nontender. No organomegaly or masses felt. Normal bowel sounds heard. Central nervous system: . No focal neurological deficits. Extremities: Moves all extremities independently, 3+ pitting edema bilateral lower extremities up to knee with erythema to right lower extremity; currently right lower extremity with dressing in place, clean/dry/intact Skin: Right lower extremity with erythema from right foot extending proximally about two thirds up to the knee with large intact bullae dorsal aspect of base of first toe with significant bilateral edema Psychiatry: Judgement and insight appear poor. Mood & affect appropriate.          Data Reviewed: I have personally reviewed following labs and imaging studies  CBC: Recent Labs  Lab 09/25/21 1835  09/27/21 0536 09/28/21 0522  WBC 22.6* 17.2* 14.3*  NEUTROABS 20.3*  --   --   HGB 10.6* 9.8* 10.1*  HCT 32.7* 29.3* 30.2*  MCV 106.2* 100.7* 101.3*  PLT 180 132* 825*   Basic Metabolic Panel: Recent Labs  Lab 09/25/21 1835 09/26/21 0605 09/27/21 0536 09/28/21 0522  NA 134* 134* 134* 132*  K 5.0 4.4 3.5 3.4*  CL 100 103 103 98  CO2 17* 15* 17* 17*  GLUCOSE 135* 108* 115* 134*  BUN 115* 128* 245* 231*  CREATININE 5.18* 4.93* 4.79* 4.55*  CALCIUM 8.4* 8.2* 7.6* 7.3*  MG  --  2.2 2.0  --    GFR: Estimated Creatinine Clearance: 11.1 mL/min (A) (by C-G formula based on SCr of 4.55 mg/dL (H)). Liver Function Tests: Recent Labs  Lab 09/25/21 1835 09/26/21 0605 09/27/21 0536 09/28/21 0522  AST 71* 56* 45* 52*  ALT 157* 128* 102* 95*  ALKPHOS 199* 171* 140* 151*  BILITOT 1.3* 1.2 0.8 0.6  PROT 7.1 6.3* 5.7* 6.1*  ALBUMIN 3.3* 2.6* 2.4* 2.4*   No results for input(s): LIPASE, AMYLASE in the last 168 hours. No results for input(s): AMMONIA in the last 168 hours. Coagulation Profile: Recent Labs  Lab 09/25/21 1835 09/26/21 0605  INR 2.4* 2.1*   Cardiac Enzymes: No results for input(s): CKTOTAL, CKMB, CKMBINDEX, TROPONINI in the last 168 hours. BNP (last 3 results) No results for input(s): PROBNP in the last 8760 hours. HbA1C: No results for input(s): HGBA1C in the last 72 hours. CBG: Recent Labs  Lab 09/25/21 1815 09/26/21 2049 09/27/21 0726 09/27/21 1142  GLUCAP 126* 107* 111* 175*   Lipid Profile: No results for input(s): CHOL,  HDL, LDLCALC, TRIG, CHOLHDL, LDLDIRECT in the last 72 hours. Thyroid Function Tests: Recent Labs    09/27/21 0535  TSH 1.541   Anemia Panel: Recent Labs    09/26/21 0605  VITAMINB12 1,404*  FOLATE 32.3  FERRITIN 322  TIBC 176*  IRON 15*   Sepsis Labs: Recent Labs  Lab 09/25/21 1835 09/26/21 0605 09/27/21 0536 09/28/21 0522  PROCALCITON  --  6.98 6.00 4.54  LATICACIDVEN 1.5  --   --   --     Recent Results  (from the past 240 hour(s))  Culture, blood (routine x 2)     Status: None (Preliminary result)   Collection Time: 09/25/21  6:35 PM   Specimen: BLOOD  Result Value Ref Range Status   Specimen Description   Final    BLOOD SITE NOT SPECIFIED Performed at [facility] [address] [place], [place] [address]    Special Requests   Final    BOTTLES DRAWN AEROBIC AND ANAEROBIC Blood Culture adequate volume Performed at [facility] [address], [place], [place] [address]    Culture   Final    NO GROWTH 3 DAYS Performed at [facility], [place] [address], [place], [place] [address]    Report Status PENDING  Incomplete  Culture, blood (routine x 2)     Status: None (Preliminary result)   Collection Time: 09/25/21  6:35 PM   Specimen: BLOOD  Result Value Ref Range Status   Specimen Description   Final    BLOOD BLOOD LEFT HAND Performed at [facility] [address] [place], [place] [address]    Special Requests   Final    BOTTLES DRAWN AEROBIC AND ANAEROBIC Blood Culture results may not be optimal due to an inadequate volume of blood received in culture bottles Performed at [facility] [address], [place], [place] [address]    Culture   Final    NO GROWTH 3 DAYS Performed at [facility], [place] [address] [place], [place] [address]    Report Status PENDING  Incomplete  Resp Panel by RT-PCR (Flu A&B, Covid) Peripheral     Status: None   Collection Time: 09/25/21  6:35 PM   Specimen: Peripheral; Nasopharyngeal(NP) swabs in vial transport medium  Result Value Ref Range Status   SARS Coronavirus 2 by RT PCR NEGATIVE NEGATIVE Final    Comment: (NOTE) SARS-CoV-2 target nucleic acids are NOT DETECTED.  The SARS-CoV-2 RNA is generally detectable in upper respiratory specimens during the acute phase of infection. The lowest concentration of SARS-CoV-2 viral copies this assay can detect  is 138 copies/mL. A negative result does not preclude SARS-Cov-2 infection and should not be used as the sole basis for treatment or other patient management decisions. A negative result may occur with  improper specimen collection/handling, submission of specimen other than nasopharyngeal swab, presence of viral mutation(s) within the areas targeted by this assay, and inadequate number of viral copies(<138 copies/mL). A negative result must be combined with clinical observations, patient history, and epidemiological information. The expected result is Negative.  Fact Sheet for Patients:  EntrepreneurPulse.com.au  Fact Sheet for Healthcare Providers:  IncredibleEmployment.be  This test is no t yet approved or cleared by the [place] FDA and  has been authorized for detection and/or diagnosis of SARS-CoV-2 by FDA under an Emergency Use Authorization (EUA). This EUA will remain  in effect (meaning this test can be used) for the  duration of the COVID-19 declaration under Section 564(b)(1) of the Act, 21 U.S.C.section 360bbb-3(b)(1), unless the authorization is terminated  or revoked sooner.       Influenza A by PCR NEGATIVE NEGATIVE Final   Influenza B by PCR NEGATIVE NEGATIVE Final    Comment: (NOTE) The Xpert Xpress SARS-CoV-2/FLU/RSV plus assay is intended as an aid in the diagnosis of influenza from Nasopharyngeal swab specimens and should not be used as a sole basis for treatment. Nasal washings and aspirates are unacceptable for Xpert Xpress SARS-CoV-2/FLU/RSV testing.  Fact Sheet for Patients: EntrepreneurPulse.com.au  Fact Sheet for Healthcare Providers: IncredibleEmployment.be  This test is not yet approved or cleared by the [place] FDA and has been authorized for detection and/or diagnosis of SARS-CoV-2 by FDA under an Emergency Use Authorization (EUA). This EUA will remain in effect  (meaning this test can be used) for the duration of the COVID-19 declaration under Section 564(b)(1) of the Act, 21 U.S.C. section 360bbb-3(b)(1), unless the authorization is terminated or revoked.  Performed at [facility], [place] [address] [place], [place] [address]   MRSA Next Gen by PCR, Nasal     Status: None   Collection Time: 09/26/21  2:22 PM   Specimen: Nasal Mucosa; Nasal Swab  Result Value Ref Range Status   MRSA by PCR Next Gen NOT DETECTED NOT DETECTED Final    Comment: (NOTE) The GeneXpert MRSA Assay (FDA approved for NASAL specimens only), is one component of a comprehensive MRSA colonization surveillance program. It is not intended to diagnose MRSA infection nor to guide or monitor treatment for MRSA infections. Test performance is not FDA approved in patients less than 84 years old. Performed at [facility], [place] [address] [place], [place] [address]          Radiology Studies: CT ABDOMEN PELVIS WO CONTRAST  Result Date: 09/27/2021 CLINICAL DATA:  Hydronephrosis EXAM: CT ABDOMEN AND PELVIS WITHOUT CONTRAST TECHNIQUE: Multidetector CT imaging of the abdomen and pelvis was performed following the standard protocol without IV contrast. COMPARISON:  Renal ultrasound dated 09/25/2021 FINDINGS: Motion degraded images. Lower chest: Moderate bilateral pleural effusions with compressive atelectasis. Hepatobiliary: Unenhanced liver is unremarkable. Gallbladder is unremarkable. No intrahepatic or extrahepatic ductal dilatation. Pancreas: Within normal limits. Spleen: Within normal limits. Adrenals/Urinary Tract: Adrenal glands are within normal limits. Kidneys are notable for a 17 mm left lower pole renal cyst (series 2/image 33). Punctate nonobstructing left lower pole renal calculus (series 2/image 28). Moderate bilateral hydroureteronephrosis to the level of the UVJ. Concentric but irregular masslike bladder wall thickening (series  2/image 62). Bladder is decompressed by an indwelling Foley catheter. Differential considerations include chronic bladder outlet obstruction/neurogenic bladder or infiltrating urothelial neoplasm. Stomach/Bowel: Stomach is within normal limits. No evidence of bowel obstruction. Normal appendix (series 2/image 47). Mild left colonic diverticulosis, without evidence of diverticulitis. Vascular/Lymphatic: No evidence of abdominal aortic aneurysm. Atherosclerotic calcifications of the abdominal aorta and branch vessels. No suspicious abdominopelvic lymphadenopathy. Reproductive: Prostate is notable for dystrophic calcifications and mild enlargement of the central gland, indenting the base of the bladder, suggesting BPH Other: No abdominopelvic ascites. Musculoskeletal: Multifocal sclerotic osseous metastases throughout the visualized axial and appendicular skeleton. This raises concern for nonvisualized primary malignancy such as prostate cancer. IMPRESSION: Multifocal sclerotic osseous metastases throughout the visualized axial hepatic color skeleton. This raises concern for nonvisualized primary malignancy such as prostate cancer. Correlate with PSA. Prostate is poorly evaluated on unenhanced CT.  Suspected BPH. Bladder decompressed by indwelling Foley  catheter. Masslike concentric bladder wall thickening may reflect chronic bladder outlet obstruction/neurogenic bladder, but infiltrating urothelial neoplasm is not excluded. Consider cystoscopy as clinically warranted. Moderate bilateral hydroureteronephrosis to the level of the UVJ, presumably secondary to the findings above. Moderate bilateral pleural effusions, incompletely visualized. Electronically Signed   By: Julian Hy M.D.   On: 09/27/2021 19:52        Scheduled Meds:  (feeding supplement) PROSource Plus  30 mL Oral BID BM   Chlorhexidine Gluconate Cloth  6 each Topical Daily   feeding supplement (NEPRO CARB STEADY)  237 mL Oral Q24H    furosemide  40 mg Intravenous Q12H   heparin  5,000 Units Subcutaneous Q8H   metoprolol tartrate  25 mg Oral BID   multivitamin  1 tablet Oral QHS   sodium chloride flush  3 mL Intravenous Q12H   Continuous Infusions:  amiodarone 30 mg/hr (09/28/21 0411)   cefTRIAXone (ROCEPHIN)  IV 2 g (09/27/21 1345)     LOS: 3 days    Time spent: 43 minutes spent on chart review, discussion with nursing staff, consultants, updating family and interview/physical exam; more than 50% of that time was spent in counseling and/or coordination of care.    Tyquavious Gamel J [place] DO [facility] Available via Epic secure chat 7am-7pm After these hours, please refer to coverage provider listed on [email or web address] 09/28/2021, 10:43 AM

## 2021-09-28 NOTE — Progress Notes (Signed)
PT Cancellation Note  Patient Details Name: Daniel Andersen MRN: [id number] DOB: [birth year]   Cancelled Treatment:    Reason Eval/Treat Not Completed: Medical issues which prohibited therapy (HR 129 at rest, will follow.)   Philomena Doheny PT 09/28/2021  Acute Rehabilitation Services Pager [phone] Office [phone]

## 2021-09-28 NOTE — Progress Notes (Signed)
Subjective/Chief Complaint:  1 - Widely Metastatic Prostate Cancer - diffuse bone mets, PSA 246 on eval acute renal failure. Firmagon 240mg  given 09/27/21 as initial androgen deprivation.  2 - Malignant Renal Obstruction / Acute Renal Failure - Cr 5's on admit 09/2021 up from <1 1019, CT with bilateral hydro to distal ureters. He is very poor surgical candidate. Some but incomplete GFR recovery with catheter only  3 - Urinary Retention - s/p 61F coude catheter placement at presentation.   4 - End of Life Care - pt with profound heart failure, kidney failure, metastatic / incurable cancer, and terrible functional baseline. Per pt's daughter / Daniel Andersen [phone] he was living independently prior to admission but becoming unable to care for himself, overdue bills, etc...  Today "Daniel Andersen" is seen in f/u above. His renal function remains poor and hydro persists despite foley. He is unable to provide any meaningful history.    Objective: Vital signs in last 24 hours: Temp:  [97.4 F (36.3 C)-97.8 F (36.6 C)] 97.4 F (36.3 C) (12/10 0900) Pulse Rate:  [63-129] 129 (12/10 0610) Resp:  [18-28] 18 (12/10 0610) BP: (71-112)/(56-96) 107/96 (12/10 0610) SpO2:  [98 %-100 %] 98 % (12/10 0610) Weight:  [77.7 kg] 77.7 kg (12/10 0339) Last BM Date: 09/26/21  Intake/Output from previous day: 12/09 0701 - 12/10 0700 In: 1543 [P.O.:1110; I.V.:333; IV Piggyback:100] Out: 250 [Urine:250] Intake/Output this shift: No intake/output data recorded.  EXAM: AOx0, awake, very frail Mild increased WOB HR 110's by monitor Mild truncal obestiy, no CVAT, no r/g Mostly buried penis. Foley in place with non-foul urine Very large LE symmetric edema   Lab Results:  Recent Labs    09/27/21 0536 09/28/21 0522  WBC 17.2* 14.3*  HGB 9.8* 10.1*  HCT 29.3* 30.2*  PLT 132* 142*   BMET Recent Labs    09/27/21 0536 09/28/21 0522  NA 134* 132*  K 3.5 3.4*  CL 103 98  CO2 17* 17*  GLUCOSE 115*  134*  BUN 245* 231*  CREATININE 4.79* 4.55*  CALCIUM 7.6* 7.3*   PT/INR Recent Labs    09/25/21 1835 09/26/21 0605  LABPROT 25.8* 23.4*  INR 2.4* 2.1*   ABG No results for input(s): PHART, HCO3 in the last 72 hours.  Invalid input(s): PCO2, PO2  Studies/Results: CT ABDOMEN PELVIS WO CONTRAST  Result Date: 09/27/2021 CLINICAL DATA:  Hydronephrosis EXAM: CT ABDOMEN AND PELVIS WITHOUT CONTRAST TECHNIQUE: Multidetector CT imaging of the abdomen and pelvis was performed following the standard protocol without IV contrast. COMPARISON:  Renal ultrasound dated 09/25/2021 FINDINGS: Motion degraded images. Lower chest: Moderate bilateral pleural effusions with compressive atelectasis. Hepatobiliary: Unenhanced liver is unremarkable. Gallbladder is unremarkable. No intrahepatic or extrahepatic ductal dilatation. Pancreas: Within normal limits. Spleen: Within normal limits. Adrenals/Urinary Tract: Adrenal glands are within normal limits. Kidneys are notable for a 17 mm left lower pole renal cyst (series 2/image 33). Punctate nonobstructing left lower pole renal calculus (series 2/image 28). Moderate bilateral hydroureteronephrosis to the level of the UVJ. Concentric but irregular masslike bladder wall thickening (series 2/image 62). Bladder is decompressed by an indwelling Foley catheter. Differential considerations include chronic bladder outlet obstruction/neurogenic bladder or infiltrating urothelial neoplasm. Stomach/Bowel: Stomach is within normal limits. No evidence of bowel obstruction. Normal appendix (series 2/image 47). Mild left colonic diverticulosis, without evidence of diverticulitis. Vascular/Lymphatic: No evidence of abdominal aortic aneurysm. Atherosclerotic calcifications of the abdominal aorta and branch vessels. No suspicious abdominopelvic lymphadenopathy. Reproductive: Prostate is notable for dystrophic calcifications and  mild enlargement of the central gland, indenting the base of the  bladder, suggesting BPH Other: No abdominopelvic ascites. Musculoskeletal: Multifocal sclerotic osseous metastases throughout the visualized axial and appendicular skeleton. This raises concern for nonvisualized primary malignancy such as prostate cancer. IMPRESSION: Multifocal sclerotic osseous metastases throughout the visualized axial hepatic color skeleton. This raises concern for nonvisualized primary malignancy such as prostate cancer. Correlate with PSA. Prostate is poorly evaluated on unenhanced CT.  Suspected BPH. Bladder decompressed by indwelling Foley catheter. Masslike concentric bladder wall thickening may reflect chronic bladder outlet obstruction/neurogenic bladder, but infiltrating urothelial neoplasm is not excluded. Consider cystoscopy as clinically warranted. Moderate bilateral hydroureteronephrosis to the level of the UVJ, presumably secondary to the findings above. Moderate bilateral pleural effusions, incompletely visualized. Electronically Signed   By: Julian Hy M.D.   On: 09/27/2021 19:52    Anti-infectives: Anti-infectives (From admission, onward)    Start     Dose/Rate Route Frequency Ordered Stop   09/27/21 1200  vancomycin (VANCOREADY) IVPB 750 mg/150 mL  Status:  Discontinued        750 mg 150 mL/hr over 60 Minutes Intravenous  Once 09/27/21 0818 09/27/21 0831   09/27/21 1000  vancomycin (VANCOREADY) IVPB 750 mg/150 mL  Status:  Discontinued        750 mg 150 mL/hr over 60 Minutes Intravenous  Once 09/27/21 0806 09/27/21 0818   09/27/21 1000  cefTRIAXone (ROCEPHIN) 2 g in sodium chloride 0.9 % 100 mL IVPB        2 g 200 mL/hr over 30 Minutes Intravenous Every 24 hours 09/27/21 0831     09/26/21 1800  ceFEPIme (MAXIPIME) 1 g in sodium chloride 0.9 % 100 mL IVPB  Status:  Discontinued        1 g 200 mL/hr over 30 Minutes Intravenous Every 24 hours 09/25/21 2122 09/27/21 0831   09/25/21 2122  vancomycin variable dose per unstable renal function (pharmacist dosing)   Status:  Discontinued         Does not apply See admin instructions 09/25/21 2123 09/27/21 0831   09/25/21 1815  vancomycin (VANCOREADY) IVPB 1500 mg/300 mL        1,500 mg 150 mL/hr over 120 Minutes Intravenous  Once 09/25/21 1804 09/25/21 2106   09/25/21 1815  ceFEPIme (MAXIPIME) 2 g in sodium chloride 0.9 % 100 mL IVPB        2 g 200 mL/hr over 30 Minutes Intravenous  Once 09/25/21 1804 09/25/21 2035       Assessment/Plan:  Discussed frankly with daughter by phone his very poor overall prognosis and options of max aggressive care (proceed with attempt ureteral stents or neph tubes) v. Palliative transition (recommended as minimal chance of meaningful functional recovery). Daughter is overwhealmed and undecided. This is understandable. As K acceptable and making urine, will hold off any additional decompression. Daughter voiced understanding of competing risks.  Continue foley for now.  Agree with palliative consult. I estimate his live expectance <56mos, likely much less.    Alexis Frock 09/28/2021

## 2021-09-28 NOTE — Evaluation (Signed)
Physical Therapy Evaluation Patient Details Name: Daniel Andersen MRN: [id number] DOB: 05-23-1932 Today's Date: 09/28/2021  History of Present Illness  Daniel Andersen is a 87 y.o. male presented to the ED for evaluation of right lower extremity infection--found to have sepsis due to RLE cellulitis and acute renal failure.  PHMx: hypertension and alcohol use  Clinical Impression  Pt admitted with above diagnosis. Pt soiled in BM. Assisted NT and RN with rolling pt to L and R for pericare and linen change. +2 total assist for rolling. BP 76/57 supine so did not attempt further mobility. He will likely need SNF.  Pt currently with functional limitations due to the deficits listed below (see PT Problem List). Pt will benefit from skilled PT to increase their independence and safety with mobility to allow discharge to the venue listed below.          Recommendations for follow up therapy are one component of a multi-disciplinary discharge planning process, led by the attending physician.  Recommendations may be updated based on patient status, additional functional criteria and insurance authorization.  Follow Up Recommendations Skilled nursing-short term rehab (<3 hours/day)    Assistance Recommended at Discharge Frequent or constant Supervision/Assistance  Functional Status Assessment Patient has had a recent decline in their functional status and demonstrates the ability to make significant improvements in function in a reasonable and predictable amount of time.  Equipment Recommendations  None recommended by PT    Recommendations for Other Services       Precautions / Restrictions Precautions Precautions: Fall Precaution Comments: pt denies h/o falls in past 6 months Restrictions Weight Bearing Restrictions: No      Mobility  Bed Mobility Overal bed mobility: Needs Assistance Bed Mobility: Rolling Rolling: Total assist;+2 for physical assistance         General bed mobility  comments: rolling L and R for pericare and linen change, pt soiled with BM, +2 total assist (pt 5%), pt resistant to all movement, required max encouragement    Transfers                   General transfer comment: deferred 2* BP 76/57    Ambulation/Gait                  Stairs            Wheelchair Mobility    Modified Rankin (Stroke Patients Only)       Balance                                             Pertinent Vitals/Pain Pain Assessment: Faces Faces Pain Scale: Hurts even more Pain Location: BLEs with movement Pain Descriptors / Indicators: Grimacing Pain Intervention(s): Limited activity within patient's tolerance;Monitored during session;Repositioned    Home Living Family/patient expects to be discharged to:: Private residence Living Arrangements: Alone Available Help at Discharge: Family;Available PRN/intermittently Type of Home: House Home Access: Stairs to enter Entrance Stairs-Rails: Right;Left;Can reach both Entrance Stairs-Number of Steps: 5   Home Layout: 1/2 bath on main level;Bed/bath upstairs Home Equipment: Rolling Walker (2 wheels)      Prior Function Prior Level of Function : Independent/Modified Independent;Driving             Mobility Comments: walks without AD, drove until around Thanksgiving per daughter       Hand Dominance  Extremity/Trunk Assessment   Upper Extremity Assessment Upper Extremity Assessment: Defer to OT evaluation    Lower Extremity Assessment Lower Extremity Assessment: Generalized weakness;RLE deficits/detail;LLE deficits/detail RLE Deficits / Details: assisted pt with rolling for pericare/linen change, pt moaning in pain with rolling RLE: Unable to fully assess due to pain LLE Deficits / Details: assisted pt with rolling for pericare/linen change, pt moaning in pain with rolling LLE: Unable to fully assess due to pain       Communication    Communication: HOH  Cognition Arousal/Alertness: Awake/alert Behavior During Therapy: Anxious Overall Cognitive Status: Difficult to assess                                          General Comments      Exercises     Assessment/Plan    PT Assessment Patient needs continued PT services  PT Problem List Decreased strength;Decreased mobility;Decreased activity tolerance;Pain       PT Treatment Interventions Therapeutic activities;Therapeutic exercise;Gait training;Patient/family education;Functional mobility training    PT Goals (Current goals can be found in the Care Plan section)  Acute Rehab PT Goals Patient Stated Goal: to get stronger PT Goal Formulation: With patient/family Time For Goal Achievement: 25-Oct-2021 Potential to Achieve Goals: Fair    Frequency Min 2X/week   Barriers to discharge        Co-evaluation               AM-PAC PT "6 Clicks" Mobility  Outcome Measure Help needed turning from your back to your side while in a flat bed without using bedrails?: Total Help needed moving from lying on your back to sitting on the side of a flat bed without using bedrails?: Total Help needed moving to and from a bed to a chair (including a wheelchair)?: Total Help needed standing up from a chair using your arms (e.g., wheelchair or bedside chair)?: Total Help needed to walk in hospital room?: Total Help needed climbing 3-5 steps with a railing? : Total 6 Click Score: 6    End of Session   Activity Tolerance: Patient limited by pain Patient left: in bed;with nursing/sitter in room;with call bell/phone within reach;with family/visitor present Nurse Communication: Mobility status;Need for lift equipment PT Visit Diagnosis: Pain;Other abnormalities of gait and mobility (R26.89);Muscle weakness (generalized) (M62.81);Difficulty in walking, not elsewhere classified (R26.2) Pain - Right/Left: Right Pain - part of body: Leg    Time: 1950-9326 PT  Time Calculation (min) (ACUTE ONLY): 14 min   Charges:   PT Evaluation $PT Eval Moderate Complexity: 1 Mod         Philomena Doheny PT 09/28/2021  Acute Rehabilitation Services Pager [phone] Office [phone]

## 2021-09-28 NOTE — Progress Notes (Addendum)
DAILY PROGRESS NOTE   Patient Name: Daniel Andersen Date of Encounter: 09/28/2021 Cardiologist: Hilty  Chief Complaint   Feels miserable eating breakfast   Patient Profile   87 yo male presented with soft tissue infection and lower extremity edema, found to have high BNP of 3827 and echo showed LVEF of 25-30%  Objective   Vitals:   09/28/21 0405 09/28/21 0500 09/28/21 0600 09/28/21 0610  BP: 91/71 (!) 84/61 97/67 (!) 107/96  Pulse: (!) 121   (!) 129  Resp: 18 20 20 18   Temp:      TempSrc:      SpO2: 99% 99% 100% 98%  Weight:      Height:        Intake/Output Summary (Last 24 hours) at 09/28/2021 2836 Last data filed at 09/28/2021 0500 Gross per 24 hour  Intake 1542.96 ml  Output 250 ml  Net 1292.96 ml   Filed Weights   09/25/21 1756 09/27/21 0125 09/28/21 0339  Weight: 77.1 kg 80.4 kg 77.7 kg    Physical Exam   Chronically ill elderly male Lungs clear No murmur Plus 2 edema with bullae and erythema Palpable DP bialterally   Inpatient Medications    Scheduled Meds:  (feeding supplement) PROSource Plus  30 mL Oral BID BM   Chlorhexidine Gluconate Cloth  6 each Topical Daily   feeding supplement (NEPRO CARB STEADY)  237 mL Oral Q24H   furosemide  40 mg Intravenous Q12H   heparin  5,000 Units Subcutaneous Q8H   metoprolol tartrate  25 mg Oral BID   multivitamin  1 tablet Oral QHS   sodium chloride flush  3 mL Intravenous Q12H    Continuous Infusions:  amiodarone 30 mg/hr (09/28/21 0411)   cefTRIAXone (ROCEPHIN)  IV 2 g (09/27/21 1345)    PRN Meds: acetaminophen **OR** acetaminophen, melatonin, ondansetron **OR** ondansetron (ZOFRAN) IV, senna-docusate   Labs   Results for orders placed or performed during the hospital encounter of 09/25/21 (from the past 48 hour(s))  MRSA Next Gen by PCR, Nasal     Status: None   Collection Time: 09/26/21  2:22 PM   Specimen: Nasal Mucosa; Nasal Swab  Result Value Ref Range   MRSA by PCR Next Gen NOT  DETECTED NOT DETECTED    Comment: (NOTE) The GeneXpert MRSA Assay (FDA approved for NASAL specimens only), is one component of a comprehensive MRSA colonization surveillance program. It is not intended to diagnose MRSA infection nor to guide or monitor treatment for MRSA infections. Test performance is not FDA approved in patients less than 34 years old. Performed at [facility], [place] [address] [place], [place] [address]   Glucose, capillary     Status: Abnormal   Collection Time: 09/26/21  8:49 PM  Result Value Ref Range   Glucose-Capillary 107 (H) 70 - 99 mg/dL    Comment: Glucose reference range applies only to samples taken after fasting for at least 8 hours.  TSH     Status: None   Collection Time: 09/27/21  5:35 AM  Result Value Ref Range   TSH 1.541 0.350 - 4.500 uIU/mL    Comment: Performed by a 3rd Generation assay with a functional sensitivity of <=0.01 uIU/mL. Performed at [facility], [place] [address], [place], [place] [address]   Vancomycin, random     Status: None   Collection Time: 09/27/21  5:36 AM  Result Value Ref Range   Vancomycin Rm 16  Comment:        Random Vancomycin therapeutic range is dependent on dosage and time of specimen collection. A peak range is 20.0-40.0 ug/mL A trough range is 5.0-15.0 ug/mL        Performed at [place] [address], [place], [place] [address]   CBC     Status: Abnormal   Collection Time: 09/27/21  5:36 AM  Result Value Ref Range   WBC 17.2 (H) 4.0 - 10.5 K/uL   RBC 2.91 (L) 4.22 - 5.81 MIL/uL   Hemoglobin 9.8 (L) 13.0 - 17.0 g/dL   HCT 29.3 (L) 39.0 - 52.0 %   MCV 100.7 (H) 80.0 - 100.0 fL   MCH 33.7 26.0 - 34.0 pg   MCHC 33.4 30.0 - 36.0 g/dL   RDW 13.9 11.5 - 15.5 %   Platelets 132 (L) 150 - 400 K/uL   nRBC 0.0 0.0 - 0.2 %    Comment: Performed at [facility], [place] [address] [place], [place] [address]  Comprehensive metabolic  panel     Status: Abnormal   Collection Time: 09/27/21  5:36 AM  Result Value Ref Range   Sodium 134 (L) 135 - 145 mmol/L   Potassium 3.5 3.5 - 5.1 mmol/L    Comment: DELTA CHECK NOTED NO VISIBLE HEMOLYSIS    Chloride 103 98 - 111 mmol/L   CO2 17 (L) 22 - 32 mmol/L   Glucose, Bld 115 (H) 70 - 99 mg/dL    Comment: Glucose reference range applies only to samples taken after fasting for at least 8 hours.   BUN 245 (H) 8 - 23 mg/dL    Comment: RESULTS CONFIRMED BY MANUAL DILUTION   Creatinine, Ser 4.79 (H) 0.61 - 1.24 mg/dL   Calcium 7.6 (L) 8.9 - 10.3 mg/dL   Total Protein 5.7 (L) 6.5 - 8.1 g/dL   Albumin 2.4 (L) 3.5 - 5.0 g/dL   AST 45 (H) 15 - 41 U/L   ALT 102 (H) 0 - 44 U/L   Alkaline Phosphatase 140 (H) 38 - 126 U/L   Total Bilirubin 0.8 0.3 - 1.2 mg/dL   GFR, Estimated 11 (L) >60 mL/min    Comment: (NOTE) Calculated using the CKD-EPI Creatinine Equation (2021)    Anion gap 14 5 - 15    Comment: Performed at [facility], [place] [address], [place], [place] [address]  Magnesium     Status: None   Collection Time: 09/27/21  5:36 AM  Result Value Ref Range   Magnesium 2.0 1.7 - 2.4 mg/dL    Comment: Performed at [facility], [place] [address] [place],  [address]  Procalcitonin     Status: None   Collection Time: 09/27/21  5:36 AM  Result Value Ref Range   Procalcitonin 6.00 ng/mL    Comment:        Interpretation: PCT > 2 ng/mL: Systemic infection (sepsis) is likely, unless other causes are known. (NOTE)       Sepsis PCT Algorithm           Lower Respiratory Tract                                      Infection PCT Algorithm    ----------------------------     ----------------------------         PCT < 0.25 ng/mL  PCT < 0.10 ng/mL          Strongly encourage             Strongly discourage   discontinuation of antibiotics    initiation of antibiotics    ----------------------------      -----------------------------       PCT 0.25 - 0.50 ng/mL            PCT 0.10 - 0.25 ng/mL               OR       >80% decrease in PCT            Discourage initiation of                                            antibiotics      Encourage discontinuation           of antibiotics    ----------------------------     -----------------------------         PCT >= 0.50 ng/mL              PCT 0.26 - 0.50 ng/mL               AND       <80% decrease in PCT              Encourage initiation of                                             antibiotics       Encourage continuation           of antibiotics    ----------------------------     -----------------------------        PCT >= 0.50 ng/mL                  PCT > 0.50 ng/mL               AND         increase in PCT                  Strongly encourage                                      initiation of antibiotics    Strongly encourage escalation           of antibiotics                                     -----------------------------                                           PCT <= 0.25 ng/mL                                                 OR                                        >  80% decrease in PCT                                      Discontinue / Do not initiate                                             antibiotics  Performed at [place] [address] [place], [place] [address]   Glucose, capillary     Status: Abnormal   Collection Time: 09/27/21  7:26 AM  Result Value Ref Range   Glucose-Capillary 111 (H) 70 - 99 mg/dL    Comment: Glucose reference range applies only to samples taken after fasting for at least 8 hours.  Glucose, capillary     Status: Abnormal   Collection Time: 09/27/21 11:42 AM  Result Value Ref Range   Glucose-Capillary 175 (H) 70 - 99 mg/dL    Comment: Glucose reference range applies only to samples taken after fasting for at least 8 hours.  CBC     Status: Abnormal    Collection Time: 09/28/21  5:22 AM  Result Value Ref Range   WBC 14.3 (H) 4.0 - 10.5 K/uL   RBC 2.98 (L) 4.22 - 5.81 MIL/uL   Hemoglobin 10.1 (L) 13.0 - 17.0 g/dL   HCT 30.2 (L) 39.0 - 52.0 %   MCV 101.3 (H) 80.0 - 100.0 fL   MCH 33.9 26.0 - 34.0 pg   MCHC 33.4 30.0 - 36.0 g/dL   RDW 13.7 11.5 - 15.5 %   Platelets 142 (L) 150 - 400 K/uL   nRBC 0.3 (H) 0.0 - 0.2 %    Comment: Performed at [facility], [place] [address] [place], [place] [address]  Comprehensive metabolic panel     Status: Abnormal   Collection Time: 09/28/21  5:22 AM  Result Value Ref Range   Sodium 132 (L) 135 - 145 mmol/L   Potassium 3.4 (L) 3.5 - 5.1 mmol/L   Chloride 98 98 - 111 mmol/L   CO2 17 (L) 22 - 32 mmol/L   Glucose, Bld 134 (H) 70 - 99 mg/dL    Comment: Glucose reference range applies only to samples taken after fasting for at least 8 hours.   BUN 231 (H) 8 - 23 mg/dL    Comment: RESULTS CONFIRMED BY MANUAL DILUTION   Creatinine, Ser 4.55 (H) 0.61 - 1.24 mg/dL   Calcium 7.3 (L) 8.9 - 10.3 mg/dL   Total Protein 6.1 (L) 6.5 - 8.1 g/dL   Albumin 2.4 (L) 3.5 - 5.0 g/dL   AST 52 (H) 15 - 41 U/L   ALT 95 (H) 0 - 44 U/L   Alkaline Phosphatase 151 (H) 38 - 126 U/L   Total Bilirubin 0.6 0.3 - 1.2 mg/dL   GFR, Estimated 12 (L) >60 mL/min    Comment: (NOTE) Calculated using the CKD-EPI Creatinine Equation (2021)    Anion gap 17 (H) 5 - 15    Comment: Performed at [facility], [place] [address] [place], [place] [address]    ECG   N/A  Telemetry   AFib with RVR - Personally Reviewed  Radiology    CT ABDOMEN PELVIS WO CONTRAST  Result Date: 09/27/2021 CLINICAL DATA:  Hydronephrosis EXAM: CT ABDOMEN AND PELVIS WITHOUT CONTRAST  TECHNIQUE: Multidetector CT imaging of the abdomen and pelvis was performed following the standard protocol without IV contrast. COMPARISON:  Renal ultrasound dated 09/25/2021 FINDINGS: Motion degraded images. Lower chest: Moderate bilateral  pleural effusions with compressive atelectasis. Hepatobiliary: Unenhanced liver is unremarkable. Gallbladder is unremarkable. No intrahepatic or extrahepatic ductal dilatation. Pancreas: Within normal limits. Spleen: Within normal limits. Adrenals/Urinary Tract: Adrenal glands are within normal limits. Kidneys are notable for a 17 mm left lower pole renal cyst (series 2/image 33). Punctate nonobstructing left lower pole renal calculus (series 2/image 28). Moderate bilateral hydroureteronephrosis to the level of the UVJ. Concentric but irregular masslike bladder wall thickening (series 2/image 62). Bladder is decompressed by an indwelling Foley catheter. Differential considerations include chronic bladder outlet obstruction/neurogenic bladder or infiltrating urothelial neoplasm. Stomach/Bowel: Stomach is within normal limits. No evidence of bowel obstruction. Normal appendix (series 2/image 47). Mild left colonic diverticulosis, without evidence of diverticulitis. Vascular/Lymphatic: No evidence of abdominal aortic aneurysm. Atherosclerotic calcifications of the abdominal aorta and branch vessels. No suspicious abdominopelvic lymphadenopathy. Reproductive: Prostate is notable for dystrophic calcifications and mild enlargement of the central gland, indenting the base of the bladder, suggesting BPH Other: No abdominopelvic ascites. Musculoskeletal: Multifocal sclerotic osseous metastases throughout the visualized axial and appendicular skeleton. This raises concern for nonvisualized primary malignancy such as prostate cancer. IMPRESSION: Multifocal sclerotic osseous metastases throughout the visualized axial hepatic color skeleton. This raises concern for nonvisualized primary malignancy such as prostate cancer. Correlate with PSA. Prostate is poorly evaluated on unenhanced CT.  Suspected BPH. Bladder decompressed by indwelling Foley catheter. Masslike concentric bladder wall thickening may reflect chronic bladder  outlet obstruction/neurogenic bladder, but infiltrating urothelial neoplasm is not excluded. Consider cystoscopy as clinically warranted. Moderate bilateral hydroureteronephrosis to the level of the UVJ, presumably secondary to the findings above. Moderate bilateral pleural effusions, incompletely visualized. Electronically Signed   By: Julian Hy M.D.   On: 09/27/2021 19:52    Cardiac Studies   See echo above  Assessment   Principal Problem:   Sepsis due to cellulitis [facility]) Active Problems:   Essential hypertension   Acute renal failure (ARF) (HCC)   Macrocytic anemia   Pleural effusion, bilateral   Plan   PAF:  in setting of age, cellulitis and likely metastatic prostate CA Continue amiodarone and lopressor BP soft trial of low dose heparin concern for hematuria developing  DCM:  TTE with EF 25-30% GDMT limited by low BP I/O's positive concern for elevated Cr 4.55 today moderate bilateral hydronephrosis to level of UVJ Lasix 40 mg bid Not clear if he would be candidate for stent Urology has seen     Jenkins Rouge 09/28/2021, 8:52 AM

## 2021-09-29 DIAGNOSIS — Z515 Encounter for palliative care: Secondary | ICD-10-CM

## 2021-09-29 DIAGNOSIS — J90 Pleural effusion, not elsewhere classified: Secondary | ICD-10-CM

## 2021-09-29 DIAGNOSIS — Z66 Do not resuscitate: Secondary | ICD-10-CM

## 2021-09-29 DIAGNOSIS — R0600 Dyspnea, unspecified: Secondary | ICD-10-CM

## 2021-09-29 DIAGNOSIS — N179 Acute kidney failure, unspecified: Secondary | ICD-10-CM

## 2021-09-29 DIAGNOSIS — Z7189 Other specified counseling: Secondary | ICD-10-CM

## 2021-09-29 DIAGNOSIS — R7989 Other specified abnormal findings of blood chemistry: Secondary | ICD-10-CM

## 2021-09-29 LAB — COMPREHENSIVE METABOLIC PANEL
ALT: 67 U/L — ABNORMAL HIGH (ref 0–44)
AST: 33 U/L (ref 15–41)
Albumin: 2.7 g/dL — ABNORMAL LOW (ref 3.5–5.0)
Alkaline Phosphatase: 126 U/L (ref 38–126)
Anion gap: 18 — ABNORMAL HIGH (ref 5–15)
BUN: 239 mg/dL — ABNORMAL HIGH (ref 8–23)
CO2: 18 mmol/L — ABNORMAL LOW (ref 22–32)
Calcium: 7.1 mg/dL — ABNORMAL LOW (ref 8.9–10.3)
Chloride: 98 mmol/L (ref 98–111)
Creatinine, Ser: 4.46 mg/dL — ABNORMAL HIGH (ref 0.61–1.24)
GFR, Estimated: 12 mL/min — ABNORMAL LOW (ref >60)
Glucose, Bld: 131 mg/dL — ABNORMAL HIGH (ref 70–99)
Potassium: 2.8 mmol/L — ABNORMAL LOW (ref 3.5–5.1)
Sodium: 134 mmol/L — ABNORMAL LOW (ref 135–145)
Total Bilirubin: 0.3 mg/dL (ref 0.3–1.2)
Total Protein: 6 g/dL — ABNORMAL LOW (ref 6.5–8.1)

## 2021-09-29 LAB — CBC
HCT: 28.2 % — ABNORMAL LOW (ref 39.0–52.0)
Hemoglobin: 9.5 g/dL — ABNORMAL LOW (ref 13.0–17.0)
MCH: 34.2 pg — ABNORMAL HIGH (ref 26.0–34.0)
MCHC: 33.7 g/dL (ref 30.0–36.0)
MCV: 101.4 fL — ABNORMAL HIGH (ref 80.0–100.0)
Platelets: 129 10*3/uL — ABNORMAL LOW (ref 150–400)
RBC: 2.78 MIL/uL — ABNORMAL LOW (ref 4.22–5.81)
RDW: 13.8 % (ref 11.5–15.5)
WBC: 13.5 10*3/uL — ABNORMAL HIGH (ref 4.0–10.5)
nRBC: 0 % (ref 0.0–0.2)

## 2021-09-29 LAB — MAGNESIUM: Magnesium: 2 mg/dL (ref 1.7–2.4)

## 2021-09-29 MED ORDER — AMIODARONE HCL 200 MG PO TABS
200.0000 mg | ORAL_TABLET | Freq: Every day | ORAL | Status: DC
  Administered 2021-09-29 – 2021-10-05 (×7): 200 mg via ORAL
  Filled 2021-09-29 (×7): qty 1

## 2021-09-29 MED ORDER — POTASSIUM CHLORIDE 10 MEQ/100ML IV SOLN
10.0000 meq | INTRAVENOUS | Status: AC
  Administered 2021-09-29 (×6): 10 meq via INTRAVENOUS
  Filled 2021-09-29 (×6): qty 100

## 2021-09-29 NOTE — Progress Notes (Signed)
DAILY PROGRESS NOTE   Patient Name: Daniel Andersen Date of Encounter: 09/29/2021 Cardiologist: Hilty  Chief Complaint   Generalized fatigue and pubic pain   Patient Profile   87 yo male presented with soft tissue infection and lower extremity edema, found to have high BNP of 3827 and echo showed LVEF of 25-30%  Objective   Vitals:   09/29/21 0200 09/29/21 0300 09/29/21 0400 09/29/21 0500  BP: 95/65 103/71 110/66 107/67  Pulse: 87 98 99 (!) 104  Resp: 17 15 19 15   Temp:    98.2 F (36.8 C)  TempSrc:    Oral  SpO2: 100% 100% 100% 100%  Weight:    75.7 kg  Height:        Intake/Output Summary (Last 24 hours) at 09/29/2021 0837 Last data filed at 09/29/2021 0830 Gross per 24 hour  Intake 1153.04 ml  Output 2080 ml  Net -926.96 ml   Filed Weights   09/27/21 0125 09/28/21 0339 09/29/21 0500  Weight: 80.4 kg 77.7 kg 75.7 kg    Physical Exam   Chronically ill elderly male Lungs clear No murmur Plus 2 edema with bullae and erythema Palpable DP bialterally   Inpatient Medications    Scheduled Meds:  (feeding supplement) PROSource Plus  30 mL Oral BID BM   Chlorhexidine Gluconate Cloth  6 each Topical Daily   feeding supplement (NEPRO CARB STEADY)  237 mL Oral Q24H   furosemide  40 mg Intravenous Q12H   heparin  5,000 Units Subcutaneous Q8H   metoprolol tartrate  25 mg Oral BID   midodrine  5 mg Oral TID WC   multivitamin  1 tablet Oral QHS   sodium chloride flush  3 mL Intravenous Q12H    Continuous Infusions:  amiodarone 30 mg/hr (09/29/21 0800)   cefTRIAXone (ROCEPHIN)  IV 2 g (09/28/21 1240)   potassium chloride      PRN Meds: acetaminophen **OR** acetaminophen, melatonin, ondansetron **OR** ondansetron (ZOFRAN) IV, senna-docusate   Labs   Results for orders placed or performed during the hospital encounter of 09/25/21 (from the past 48 hour(s))  Glucose, capillary     Status: Abnormal   Collection Time: 09/27/21 11:42 AM  Result Value Ref  Range   Glucose-Capillary 175 (H) 70 - 99 mg/dL    Comment: Glucose reference range applies only to samples taken after fasting for at least 8 hours.  CBC     Status: Abnormal   Collection Time: 09/28/21  5:22 AM  Result Value Ref Range   WBC 14.3 (H) 4.0 - 10.5 K/uL   RBC 2.98 (L) 4.22 - 5.81 MIL/uL   Hemoglobin 10.1 (L) 13.0 - 17.0 g/dL   HCT 30.2 (L) 39.0 - 52.0 %   MCV 101.3 (H) 80.0 - 100.0 fL   MCH 33.9 26.0 - 34.0 pg   MCHC 33.4 30.0 - 36.0 g/dL   RDW 13.7 11.5 - 15.5 %   Platelets 142 (L) 150 - 400 K/uL   nRBC 0.3 (H) 0.0 - 0.2 %    Comment: Performed at [facility], [place] [address] [place], [place] [address]  Comprehensive metabolic panel     Status: Abnormal   Collection Time: 09/28/21  5:22 AM  Result Value Ref Range   Sodium 132 (L) 135 - 145 mmol/L   Potassium 3.4 (L) 3.5 - 5.1 mmol/L   Chloride 98 98 - 111 mmol/L   CO2 17 (L) 22 - 32 mmol/L   Glucose, Bld 134 (  H) 70 - 99 mg/dL    Comment: Glucose reference range applies only to samples taken after fasting for at least 8 hours.   BUN 231 (H) 8 - 23 mg/dL    Comment: RESULTS CONFIRMED BY MANUAL DILUTION   Creatinine, Ser 4.55 (H) 0.61 - 1.24 mg/dL   Calcium 7.3 (L) 8.9 - 10.3 mg/dL   Total Protein 6.1 (L) 6.5 - 8.1 g/dL   Albumin 2.4 (L) 3.5 - 5.0 g/dL   AST 52 (H) 15 - 41 U/L   ALT 95 (H) 0 - 44 U/L   Alkaline Phosphatase 151 (H) 38 - 126 U/L   Total Bilirubin 0.6 0.3 - 1.2 mg/dL   GFR, Estimated 12 (L) >60 mL/min    Comment: (NOTE) Calculated using the CKD-EPI Creatinine Equation (2021)    Anion gap 17 (H) 5 - 15    Comment: Performed at [facility], [place] [address], [place], [place] [address]  Procalcitonin     Status: None   Collection Time: 09/28/21  5:22 AM  Result Value Ref Range   Procalcitonin 4.54 ng/mL    Comment:        Interpretation: PCT > 2 ng/mL: Systemic infection (sepsis) is likely, unless other causes are known. (NOTE)       Sepsis PCT Algorithm            Lower Respiratory Tract                                      Infection PCT Algorithm    ----------------------------     ----------------------------         PCT < 0.25 ng/mL                PCT < 0.10 ng/mL          Strongly encourage             Strongly discourage   discontinuation of antibiotics    initiation of antibiotics    ----------------------------     -----------------------------       PCT 0.25 - 0.50 ng/mL            PCT 0.10 - 0.25 ng/mL               OR       >80% decrease in PCT            Discourage initiation of                                            antibiotics      Encourage discontinuation           of antibiotics    ----------------------------     -----------------------------         PCT >= 0.50 ng/mL              PCT 0.26 - 0.50 ng/mL               AND       <80% decrease in PCT              Encourage initiation of  antibiotics       Encourage continuation           of antibiotics    ----------------------------     -----------------------------        PCT >= 0.50 ng/mL                  PCT > 0.50 ng/mL               AND         increase in PCT                  Strongly encourage                                      initiation of antibiotics    Strongly encourage escalation           of antibiotics                                     -----------------------------                                           PCT <= 0.25 ng/mL                                                 OR                                        > 80% decrease in PCT                                      Discontinue / Do not initiate                                             antibiotics  Performed at [place] [address], [place], [place] [address]   CBC     Status: Abnormal   Collection Time: 09/29/21  5:18 AM  Result Value Ref Range   WBC 13.5 (H) 4.0 - 10.5 K/uL   RBC 2.78 (L) 4.22 - 5.81 MIL/uL    Hemoglobin 9.5 (L) 13.0 - 17.0 g/dL   HCT 28.2 (L) 39.0 - 52.0 %   MCV 101.4 (H) 80.0 - 100.0 fL   MCH 34.2 (H) 26.0 - 34.0 pg   MCHC 33.7 30.0 - 36.0 g/dL   RDW 13.8 11.5 - 15.5 %   Platelets 129 (L) 150 - 400 K/uL   nRBC 0.0 0.0 - 0.2 %    Comment: Performed at [facility], [place] [address] [place], [place] [address]  Comprehensive metabolic panel     Status: Abnormal   Collection Time: 09/29/21  5:18 AM  Result Value Ref Range   Sodium 134 (L) 135 - 145  mmol/L   Potassium 2.8 (L) 3.5 - 5.1 mmol/L    Comment: DELTA CHECK NOTED   Chloride 98 98 - 111 mmol/L   CO2 18 (L) 22 - 32 mmol/L   Glucose, Bld 131 (H) 70 - 99 mg/dL    Comment: Glucose reference range applies only to samples taken after fasting for at least 8 hours.   BUN 239 (H) 8 - 23 mg/dL    Comment: RESULTS CONFIRMED BY MANUAL DILUTION   Creatinine, Ser 4.46 (H) 0.61 - 1.24 mg/dL   Calcium 7.1 (L) 8.9 - 10.3 mg/dL   Total Protein 6.0 (L) 6.5 - 8.1 g/dL   Albumin 2.7 (L) 3.5 - 5.0 g/dL   AST 33 15 - 41 U/L   ALT 67 (H) 0 - 44 U/L   Alkaline Phosphatase 126 38 - 126 U/L   Total Bilirubin 0.3 0.3 - 1.2 mg/dL   GFR, Estimated 12 (L) >60 mL/min    Comment: (NOTE) Calculated using the CKD-EPI Creatinine Equation (2021)    Anion gap 18 (H) 5 - 15    Comment: Performed at [facility], [place] [address], [place], [place] [address]  Magnesium     Status: None   Collection Time: 09/29/21  5:18 AM  Result Value Ref Range   Magnesium 2.0 1.7 - 2.4 mg/dL    Comment: Performed at [facility], [place] [address], [place], [place] [address]    ECG   N/A  Telemetry   AFib with RVR - Personally Reviewed  Radiology    CT ABDOMEN PELVIS WO CONTRAST  Result Date: 09/27/2021 CLINICAL DATA:  Hydronephrosis EXAM: CT ABDOMEN AND PELVIS WITHOUT CONTRAST TECHNIQUE: Multidetector CT imaging of the abdomen and pelvis was performed following the standard protocol without IV  contrast. COMPARISON:  Renal ultrasound dated 09/25/2021 FINDINGS: Motion degraded images. Lower chest: Moderate bilateral pleural effusions with compressive atelectasis. Hepatobiliary: Unenhanced liver is unremarkable. Gallbladder is unremarkable. No intrahepatic or extrahepatic ductal dilatation. Pancreas: Within normal limits. Spleen: Within normal limits. Adrenals/Urinary Tract: Adrenal glands are within normal limits. Kidneys are notable for a 17 mm left lower pole renal cyst (series 2/image 33). Punctate nonobstructing left lower pole renal calculus (series 2/image 28). Moderate bilateral hydroureteronephrosis to the level of the UVJ. Concentric but irregular masslike bladder wall thickening (series 2/image 62). Bladder is decompressed by an indwelling Foley catheter. Differential considerations include chronic bladder outlet obstruction/neurogenic bladder or infiltrating urothelial neoplasm. Stomach/Bowel: Stomach is within normal limits. No evidence of bowel obstruction. Normal appendix (series 2/image 47). Mild left colonic diverticulosis, without evidence of diverticulitis. Vascular/Lymphatic: No evidence of abdominal aortic aneurysm. Atherosclerotic calcifications of the abdominal aorta and branch vessels. No suspicious abdominopelvic lymphadenopathy. Reproductive: Prostate is notable for dystrophic calcifications and mild enlargement of the central gland, indenting the base of the bladder, suggesting BPH Other: No abdominopelvic ascites. Musculoskeletal: Multifocal sclerotic osseous metastases throughout the visualized axial and appendicular skeleton. This raises concern for nonvisualized primary malignancy such as prostate cancer. IMPRESSION: Multifocal sclerotic osseous metastases throughout the visualized axial hepatic color skeleton. This raises concern for nonvisualized primary malignancy such as prostate cancer. Correlate with PSA. Prostate is poorly evaluated on unenhanced CT.  Suspected BPH.  Bladder decompressed by indwelling Foley catheter. Masslike concentric bladder wall thickening may reflect chronic bladder outlet obstruction/neurogenic bladder, but infiltrating urothelial neoplasm is not excluded. Consider cystoscopy as clinically warranted. Moderate bilateral hydroureteronephrosis to the level of the UVJ, presumably secondary to the findings above. Moderate bilateral pleural effusions,  incompletely visualized. Electronically Signed   By: Julian Hy M.D.   On: 09/27/2021 19:52    Cardiac Studies   See echo above  Assessment   Principal Problem:   Sepsis due to cellulitis [facility]) Active Problems:   Essential hypertension   Acute renal failure (ARF) (HCC)   Macrocytic anemia   Pleural effusion, bilateral   Plan   PAF:  in setting of age, cellulitis and likely metastatic prostate CA Continue amiodarone but change to PO  and lopressor BP soft  Given palliative prognosis would not use anticoagulaiton  DCM:  TTE with EF 25-30% GDMT limited by low BP I/O's positive concern for elevated Cr 4.55 today moderate bilateral hydronephrosis to level of UVJ Lasix 40 mg bid Not clear if he would be candidate for stent Urology has seen  Cancer:  widely metastatic prostate with hydronephrosis and obstruction See note Dr Tresa Moore Agree with Palliative consult   Cardiology will sign off     Jenkins Rouge 09/29/2021, 8:37 AM

## 2021-09-29 NOTE — Progress Notes (Signed)
PROGRESS NOTE    Daniel Andersen  OZD:[id number] DOB: Oct 02, 1932 DOA: 09/25/2021 PCP: Carlena Hurl, PA-C    Brief Narrative:  Daniel Andersen is a 87 year old male with past medical history significant for essential hypertension, and alcohol use who presented to [facility] ED on 12/7 from home via EMS due to altered mental status and worsening right lower extremity wound since Thanksgiving.  Patient has also reported worsening lower extremity edema with associated erythema since this timeframe as well.  Patient denies dysuria, no chest pain, no shortness of breath, no cough/congestion, no nausea/vomiting/diarrhea, no abdominal pain.  In the ED, BP 137/73, HR 99, RR 26, temperature 96.3 F, SPO2 96% on room air.  Sodium 134, potassium 5.0, chloride 100, CO2 17, glucose 135, BUN 115, creatinine 5.18, alkaline phosphatase 199, AST 71, ALT 157, total bilirubin 1.3.  BNP 3000 and urine 27.9.  Lactic acid 1.5.  WBC 22.6.  Hemoglobin 10.6, platelet count 180.  COVID-19 PCR negative.  Influenza A/B PCR negative.  EtOH level less than 10.  Chest x-ray with cardiomegaly, vascular congestion, pulmonary edema and bilateral effusions.  Right foot and tibia/fibula x-ray with no acute osseous abnormality.  Renal ultrasound with significant hydronephrosis bilaterally extending to the level of the urinary bladder suggestive of bladder outlet obstruction.  CT chest without contrast showed changes consistent with bony metastatic disease likely related to prostate carcinoma, bilateral pleural effusions with lower lobe atelectasis with no focal mass lesion noted.  Patient was started on IV vancomycin and cefepime and received 500 cc normal saline.  Hospital service consulted for further evaluation and management.   Assessment & Plan:   Principal Problem:   Sepsis due to cellulitis [facility]) Active Problems:   Essential hypertension   Acute renal failure (ARF) (HCC)   Macrocytic anemia   Pleural effusion,  bilateral   Sepsis, POA Right lower extremity cellulitis Patient presenting to the ED with leukocytosis, respiratory rate greater than 20, HR greater than 90 and significant right lower extremity cellulitis with large intact bullae present at the base of the right first toe.  WBC count elevated 22.6 with procalcitonin 6.98.  MRSA PCR negative.  Initially started on empiric antibiotics with vancomycin and cefepime, now transitioned to ceftriaxone. --WBC 22.6>17.2>14.3>13.5 --Procal 6.98>6.00>4.54 --Blood cultures x2: No growth x 4 days --Ceftriaxone 2 g IV q24h --CBC daily  Acute renal failure Anion gap metabolic acidosis Obstructive uropathy Patient presenting with a creatinine of 5.18. FeNa = 0.5%; which is consistent with a prerenal etiology.  Additionally, patient with likely chronic outlet obstruction 2/2 BPH with likely prostate cancer complicating picture.  Renal ultrasound with significant hydronephrosis bilaterally extending to the level of the urinary bladder suggestive of bladder outlet obstruction. Foley catheter was placed.  CT abdomen/pelvis 12/9 with moderate bilateral hydroureteronephrosis to the level of UVJ.  Urology currently recommending voiding further interventions such as stent placement or nephrostomy tubes given his very poor prognosis and minimal chance of meaningful functional recovery. --Urology following, appreciate assistance --BUN 115>128>245>231>239 --Cr 5.18>4.93>4.79>4.55>4.46 --Continue Foley catheter --Urine output 1.8L past 24 hours --Strict I's and O's --Avoid nephrotoxins, renally dose all medications --Monitor renal function daily  Systolic congestive heart failure, new diagnosis with decompensation Patient with large bilateral pleural effusions, significant peripheral edema, and BNP 3827.9.  TTE with LVEF 25-30%, LV severely decreased function with global hypokinesis, mild concentric LVH, LV diastolic parameters indeterminate, LA mildly dilated, RV  systolic function severely reduced, moderate pleural effusion left lateral region, trivial MR, IVC dilated.  Cardiology initially consulted now signed off as of 09/29/2021. --Cardiology following, appreciate assistance --net negative 652 mL past 24h  --Metoprolol tartrate 25 mg p.o. twice daily --Furosemide 40 mg IV q12h --Fluid restriction 1200 mL/day --Strict I's and O's and daily weights  Atrial fibrillation with RVR TSH: 1.541 --Metoprolol tartrate 25 mg p.o. twice daily --amiodarone 200 mg p.o. daily --Heparin 5000u [place] q8h - started 10/10; cardiology recommends against anticoagulation given widely metastatic disease and need for likely a more palliative approach --Continue monitor on telemetry  Osseous metastatic lesions concerning for prostate cancer as primary Several osseous lesions noted on imaging, elevated alkaline phosphatase, andelevated PSA 246.08; highly concerning for prostate cancer.  CT abdomen/pelvis with multifocal sclerotic osseous metastatic lesions of the spine likely secondary to underlying prostate cancer. --Urology following --Started on androgen deprivation therapy 12/8 --Palliative care consulted for assistance with goals of care and medical decision making.  Macrocytic anemia Hemoglobin 10.6 with MCV 106.2 on admission.  Anemia panel with iron 15, TIBC 176, ferritin 322, B12 1404, folate 32.3.  Etiology likely secondary to anemia of chronic medical disease.  Elevated LFTs Etiology likely multifactorial, related to sepsis, congestive hepatopathy.  Also with elevated alkaline phosphatase, likely related to osseous lesions from suspected prostate cancer.  EtOH level less than 10.  Right upper quadrant ultrasound with no focal liver lesion identified, parenchymal echogenicity density within normal limits, portal vein patent.  Alcohol cessation.  Acute hepatitis panel negative. --AST 71>56>45>52>33 --ALT 157>128>102>95>67 --Avoid hepatotoxins --CMP  daily  Hypokalemia Potassium 2.8, magnesium 2.0.  Will replete potassium today. --Repeat electrolytes in a.m.  Alcohol use disorder Patient reports drinking 1-2 beers daily, denies any use within the last week.  Counseled on need for cessation --CIWA protocol  DVT prophylaxis: heparin injection 5,000 Units Start: 09/25/21 2200   Code Status: Full Code Family Communication: No family present at bedside this morning Disposition Plan:  Level ofcare: Telemetry Status is: Inpatient  Remains inpatient appropriate because: Continues on IV antibiotics for sepsis related to severe cellulitis right lower extremity, on IV diuresis for new diagnosis of systolic congestive heart failure,.  Awaiting palliative care consultation given overall extremely poor prognosis with less than 6 months of life remaining.     Consultants:  Urology Cardiology -signed off 09/30/19/2022 Palliative care  Procedures:  Foley catheter placement 12/7 TTE  Antimicrobials:  Vancomycin 12/7 - 12/9 Cefepime 12/7 - 12/9 Ceftriaxone 12/9>>   Subjective: Patient seen examined at bedside, lying in bed.  Eating breakfast.  Patient's less lethargic this morning.  No family present.  Discussed with urology, Dr. Tresa Moore this morning, will hold off on any further intervention such as stents or percutaneous nephrostomy tubes given his overall poor prognosis.  Continues to make urine, creatinine slightly decreased today.  Also seen by cardiology and now signed off.  Denies headache, no chest pain, no shortness of breath, no abdominal pain.  No acute events overnight per nursing staff.   Objective: Vitals:   09/29/21 0500 09/29/21 0800 09/29/21 1200 09/29/21 1217  BP: 107/67 108/66 100/63 100/63  Pulse: (!) 104 (!) 122 79 88  Resp: 15 16 18 20   Temp: 98.2 F (36.8 C)   97.6 F (36.4 C)  TempSrc: Oral   Oral  SpO2: 100% 99% 100% 100%  Weight: 75.7 kg     Height:        Intake/Output Summary (Last 24 hours) at  09/29/2021 1338 Last data filed at 09/29/2021 1300 Gross per 24 hour  Intake 1926.08 ml  Output 2320 ml  Net -393.92 ml   Filed Weights   09/27/21 0125 09/28/21 0339 09/29/21 0500  Weight: 80.4 kg 77.7 kg 75.7 kg    Examination:  General exam: Appears calm and comfortable, appears older than stated age, chronically ill in appearance Respiratory system: Breath sounds slightly decreased bilateral bases, mild crackles, no wheezing, normal respiratory effort without accessory muscle use, on room air with SPO2 100% at rest Cardiovascular system: S1 & S2 heard, tachycardic, irregularly irregular rhythm. No JVD, murmurs, rubs, gallops or clicks.  Gastrointestinal system: Abdomen is nondistended, soft and nontender. No organomegaly or masses felt. Normal bowel sounds heard. Central nervous system: . No focal neurological deficits. Extremities: Moves all extremities independently, 3+ pitting edema bilateral lower extremities up to knee with erythema to right lower extremity; currently right lower extremity with dressing in place, clean/dry/intact Skin: Right lower extremity with erythema from right foot extending proximally about two thirds up to the knee with large intact bullae dorsal aspect of base of first toe with significant bilateral edema Psychiatry: Judgement and insight appear poor. Mood & affect appropriate.          Data Reviewed: I have personally reviewed following labs and imaging studies  CBC: Recent Labs  Lab 09/25/21 1835 09/27/21 0536 09/28/21 0522 09/29/21 0518  WBC 22.6* 17.2* 14.3* 13.5*  NEUTROABS 20.3*  --   --   --   HGB 10.6* 9.8* 10.1* 9.5*  HCT 32.7* 29.3* 30.2* 28.2*  MCV 106.2* 100.7* 101.3* 101.4*  PLT 180 132* 142* 166*   Basic Metabolic Panel: Recent Labs  Lab 09/25/21 1835 09/26/21 0605 09/27/21 0536 09/28/21 0522 09/29/21 0518  NA 134* 134* 134* 132* 134*  K 5.0 4.4 3.5 3.4* 2.8*  CL 100 103 103 98 98  CO2 17* 15* 17* 17* 18*   GLUCOSE 135* 108* 115* 134* 131*  BUN 115* 128* 245* 231* 239*  CREATININE 5.18* 4.93* 4.79* 4.55* 4.46*  CALCIUM 8.4* 8.2* 7.6* 7.3* 7.1*  MG  --  2.2 2.0  --  2.0   GFR: Estimated Creatinine Clearance: 11.3 mL/min (A) (by C-G formula based on SCr of 4.46 mg/dL (H)). Liver Function Tests: Recent Labs  Lab 09/25/21 1835 09/26/21 0605 09/27/21 0536 09/28/21 0522 09/29/21 0518  AST 71* 56* 45* 52* 33  ALT 157* 128* 102* 95* 67*  ALKPHOS 199* 171* 140* 151* 126  BILITOT 1.3* 1.2 0.8 0.6 0.3  PROT 7.1 6.3* 5.7* 6.1* 6.0*  ALBUMIN 3.3* 2.6* 2.4* 2.4* 2.7*   No results for input(s): LIPASE, AMYLASE in the last 168 hours. No results for input(s): AMMONIA in the last 168 hours. Coagulation Profile: Recent Labs  Lab 09/25/21 1835 09/26/21 0605  INR 2.4* 2.1*   Cardiac Enzymes: No results for input(s): CKTOTAL, CKMB, CKMBINDEX, TROPONINI in the last 168 hours. BNP (last 3 results) No results for input(s): PROBNP in the last 8760 hours. HbA1C: No results for input(s): HGBA1C in the last 72 hours. CBG: Recent Labs  Lab 09/25/21 1815 09/26/21 2049 09/27/21 0726 09/27/21 1142  GLUCAP 126* 107* 111* 175*   Lipid Profile: No results for input(s): CHOL, HDL, LDLCALC, TRIG, CHOLHDL, LDLDIRECT in the last 72 hours. Thyroid Function Tests: Recent Labs    09/27/21 0535  TSH 1.541   Anemia Panel: No results for input(s): VITAMINB12, FOLATE, FERRITIN, TIBC, IRON, RETICCTPCT in the last 72 hours.  Sepsis Labs: Recent Labs  Lab 09/25/21 1835 09/26/21 0605 09/27/21 0536 09/28/21 0522  PROCALCITON  --  6.98  6.00 4.54  LATICACIDVEN 1.5  --   --   --     Recent Results (from the past 240 hour(s))  Culture, blood (routine x 2)     Status: None (Preliminary result)   Collection Time: 09/25/21  6:35 PM   Specimen: BLOOD  Result Value Ref Range Status   Specimen Description   Final    BLOOD SITE NOT SPECIFIED Performed at [facility] [address] [place], [place] [address]    Special Requests   Final    BOTTLES DRAWN AEROBIC AND ANAEROBIC Blood Culture adequate volume Performed at [facility] [address] [place], [place] [address]    Culture   Final    NO GROWTH 4 DAYS Performed at [facility], [place] [address] [place], [place] [address]    Report Status PENDING  Incomplete  Culture, blood (routine x 2)     Status: None (Preliminary result)   Collection Time: 09/25/21  6:35 PM   Specimen: BLOOD  Result Value Ref Range Status   Specimen Description   Final    BLOOD BLOOD LEFT HAND Performed at [facility] [address], [place], [place] [address]    Special Requests   Final    BOTTLES DRAWN AEROBIC AND ANAEROBIC Blood Culture results may not be optimal due to an inadequate volume of blood received in culture bottles Performed at [facility] [address] [place], [place] [address]    Culture   Final    NO GROWTH 4 DAYS Performed at [facility], [place] [address], [place],  [address]    Report Status PENDING  Incomplete  Resp Panel by RT-PCR (Flu A&B, Covid) Peripheral     Status: None   Collection Time: 09/25/21  6:35 PM   Specimen: Peripheral; Nasopharyngeal(NP) swabs in vial transport medium  Result Value Ref Range Status   SARS Coronavirus 2 by RT PCR NEGATIVE NEGATIVE Final    Comment: (NOTE) SARS-CoV-2 target nucleic acids are NOT DETECTED.  The SARS-CoV-2 RNA is generally detectable in upper respiratory specimens during the acute phase of infection. The lowest concentration of SARS-CoV-2 viral copies this assay can detect is 138 copies/mL. A negative result does not preclude SARS-Cov-2 infection and should not be used as the sole basis for treatment or other patient management decisions. A negative result may occur with  improper specimen collection/handling, submission of specimen other than nasopharyngeal swab, presence of  viral mutation(s) within the areas targeted by this assay, and inadequate number of viral copies(<138 copies/mL). A negative result must be combined with clinical observations, patient history, and epidemiological information. The expected result is Negative.  Fact Sheet for Patients:  EntrepreneurPulse.com.au  Fact Sheet for Healthcare Providers:  IncredibleEmployment.be  This test is no t yet approved or cleared by the [place] FDA and  has been authorized for detection and/or diagnosis of SARS-CoV-2 by FDA under an Emergency Use Authorization (EUA). This EUA will remain  in effect (meaning this test can be used) for the duration of the COVID-19 declaration under Section 564(b)(1) of the Act, 21 U.S.C.section 360bbb-3(b)(1), unless the authorization is terminated  or revoked sooner.       Influenza A by PCR NEGATIVE NEGATIVE Final   Influenza B by PCR NEGATIVE NEGATIVE Final    Comment: (NOTE) The Xpert Xpress SARS-CoV-2/FLU/RSV plus assay is intended as an aid in the diagnosis of influenza from Nasopharyngeal swab specimens and  should not be used as a sole basis for treatment. Nasal washings and aspirates are unacceptable for Xpert Xpress SARS-CoV-2/FLU/RSV testing.  Fact Sheet for Patients: EntrepreneurPulse.com.au  Fact Sheet for Healthcare Providers: IncredibleEmployment.be  This test is not yet approved or cleared by the [place] FDA and has been authorized for detection and/or diagnosis of SARS-CoV-2 by FDA under an Emergency Use Authorization (EUA). This EUA will remain in effect (meaning this test can be used) for the duration of the COVID-19 declaration under Section 564(b)(1) of the Act, 21 U.S.C. section 360bbb-3(b)(1), unless the authorization is terminated or revoked.  Performed at [facility] [place] [address] [place], [place] [address]   MRSA Next Gen by  PCR, Nasal     Status: None   Collection Time: 09/26/21  2:22 PM   Specimen: Nasal Mucosa; Nasal Swab  Result Value Ref Range Status   MRSA by PCR Next Gen NOT DETECTED NOT DETECTED Final    Comment: (NOTE) The GeneXpert MRSA Assay (FDA approved for NASAL specimens only), is one component of a comprehensive MRSA colonization surveillance program. It is not intended to diagnose MRSA infection nor to guide or monitor treatment for MRSA infections. Test performance is not FDA approved in patients less than 51 years old. Performed at [facility], [place] [address], [place], [place] [address]          Radiology Studies: CT ABDOMEN PELVIS WO CONTRAST  Result Date: 09/27/2021 CLINICAL DATA:  Hydronephrosis EXAM: CT ABDOMEN AND PELVIS WITHOUT CONTRAST TECHNIQUE: Multidetector CT imaging of the abdomen and pelvis was performed following the standard protocol without IV contrast. COMPARISON:  Renal ultrasound dated 09/25/2021 FINDINGS: Motion degraded images. Lower chest: Moderate bilateral pleural effusions with compressive atelectasis. Hepatobiliary: Unenhanced liver is unremarkable. Gallbladder is unremarkable. No intrahepatic or extrahepatic ductal dilatation. Pancreas: Within normal limits. Spleen: Within normal limits. Adrenals/Urinary Tract: Adrenal glands are within normal limits. Kidneys are notable for a 17 mm left lower pole renal cyst (series 2/image 33). Punctate nonobstructing left lower pole renal calculus (series 2/image 28). Moderate bilateral hydroureteronephrosis to the level of the UVJ. Concentric but irregular masslike bladder wall thickening (series 2/image 62). Bladder is decompressed by an indwelling Foley catheter. Differential considerations include chronic bladder outlet obstruction/neurogenic bladder or infiltrating urothelial neoplasm. Stomach/Bowel: Stomach is within normal limits. No evidence of bowel obstruction. Normal appendix (series 2/image 47). Mild  left colonic diverticulosis, without evidence of diverticulitis. Vascular/Lymphatic: No evidence of abdominal aortic aneurysm. Atherosclerotic calcifications of the abdominal aorta and branch vessels. No suspicious abdominopelvic lymphadenopathy. Reproductive: Prostate is notable for dystrophic calcifications and mild enlargement of the central gland, indenting the base of the bladder, suggesting BPH Other: No abdominopelvic ascites. Musculoskeletal: Multifocal sclerotic osseous metastases throughout the visualized axial and appendicular skeleton. This raises concern for nonvisualized primary malignancy such as prostate cancer. IMPRESSION: Multifocal sclerotic osseous metastases throughout the visualized axial hepatic color skeleton. This raises concern for nonvisualized primary malignancy such as prostate cancer. Correlate with PSA. Prostate is poorly evaluated on unenhanced CT.  Suspected BPH. Bladder decompressed by indwelling Foley catheter. Masslike concentric bladder wall thickening may reflect chronic bladder outlet obstruction/neurogenic bladder, but infiltrating urothelial neoplasm is not excluded. Consider cystoscopy as clinically warranted. Moderate bilateral hydroureteronephrosis to the level of the UVJ, presumably secondary to the findings above. Moderate bilateral pleural effusions, incompletely visualized. Electronically Signed   By: Julian Hy M.D.   On: 09/27/2021 19:52        Scheduled Meds:  (feeding  supplement) PROSource Plus  30 mL Oral BID BM   amiodarone  200 mg Oral Daily   Chlorhexidine Gluconate Cloth  6 each Topical Daily   feeding supplement (NEPRO CARB STEADY)  237 mL Oral Q24H   furosemide  40 mg Intravenous Q12H   heparin  5,000 Units Subcutaneous Q8H   metoprolol tartrate  25 mg Oral BID   midodrine  5 mg Oral TID WC   multivitamin  1 tablet Oral QHS   sodium chloride flush  3 mL Intravenous Q12H   Continuous Infusions:  cefTRIAXone (ROCEPHIN)  IV Stopped  (09/29/21 1035)   potassium chloride 100 mL/hr at 09/29/21 1300     LOS: 4 days    Time spent: 43 minutes spent on chart review, discussion with nursing staff, consultants, updating family and interview/physical exam; more than 50% of that time was spent in counseling and/or coordination of care.    Junice Fei J [place] DO [facility] Available via Epic secure chat 7am-7pm After these hours, please refer to coverage provider listed on [email or web address] 09/29/2021, 1:38 PM

## 2021-09-29 NOTE — Progress Notes (Signed)
OT Cancellation Note  Patient Details Name: Daniel Andersen MRN: [id number] DOB: [birth year]   Cancelled Treatment:    Reason Eval/Treat Not Completed: Patient declined, no reason specified Patient declined to participate in skilled OT services on this date. Daughter was present in room. OT to continue to follow and check back as able.  Jackelyn Poling OTR/L, [facility] Office# [phone] Pager# [phone]   09/29/2021, 1:57 PM

## 2021-09-29 NOTE — Consult Note (Signed)
Palliative Care Consult Note                                  Date: 09/29/2021   Patient Name: Daniel Andersen  DOB: [birth year]  MRN: [id number]  Age / Sex: 87 y.o., male  PCP: Glade Lloyd Camelia Eng, PA-C Referring Physician: [place] Eric J, DO  Reason for Consultation: Establishing goals of care  HPI/Patient Profile: Palliative Care consult requested for goals of care discussion in this 87 y.o. male  with past medical history of  alcohol abuse and hypertension. He was admitted on 09/25/2021 from home with worsening lower extremity edema and erythema. Chest x-ray showed cardiomegaly, vascular congestion, and bilateral effusions. Renal ultrasound showed significant hydronephrosis extending into the bladder. CT of chest showed bony metastatic disease secondary to his prostate cancer. He is being followed by Urology.   Past Medical History:  Diagnosis Date   Hypertension    Low bone mass 03/2013   Bone Density scan   Obesity    Shingles 2005   Wears glasses    Wears partial dentures      Subjective:   This NP Osborne Oman reviewed medical records, received report from team, assessed the patient and then met at the patient's bedside with Mr. [facility] and his daughter, [name] to discuss diagnosis, prognosis, GOC, EOL wishes disposition and options.   Concept of Palliative Care was introduced as specialized medical care for people and their families living with serious illness.  It focuses on providing relief from the symptoms and stress of a serious illness.  The goal is to improve quality of life for both the patient and the family. Values and goals of care important to patient and family were attempted to be elicited.  Daniel Andersen is sitting up in bed. He is awake, alert, and oriented to all questions. Is able to engage in discussions appropriate but does refer to his daughter for confirmation and input.   I created space and opportunity for  patient and family to explore state of health prior to admission, thoughts, and feelings. Daniel Andersen shares he lives alone in the home. He is widowed with one daughter. Shares she is his only family and Daniel Andersen confirms they have no other family support.   Prior to admission he was ambulatory in the home without assistive device.  Reports he was driving.  Appetite has decreased over the past 2 days 4 months.  Daughter endorses some weight loss but unsure of exact amount.  She shares patient seems to be having some difficulty in maintaining his home over the past several months.  She does go by and check on him occasionally but he has always been a private man.  She reports his house is unsanitary with cobwebs and bugs, items ordered, nonworking water, spoiled food everywhere, bills piled up on his table with many overdue.  Also states his cable and lights are scheduled to be disconnected on tomorrow.   We discussed His current illness and what it means in the larger context of His on-going co-morbidities. Natural disease trajectory and expectations were discussed.  Both patient and his daughter verbalized understanding of current illness and co-morbidities. Mr. Joss ask if he was dying.  I answer his question openly and honestly explaining to him that his prognosis is poor with a life expectancy of 6 months or less with 6 months being optimistic.  He verbalized understanding expressing "well  then it is what it is".  Daughter states she has a lot of preparations to do as she is trying to work a full-time job and "clean up things regarding his bills".  She is anxious expressing she does not have access to his accounts or the ability to do so other things that need to be done on his behalf.  She is asking about completing a will however advise the hospital does not assist with [place] only healthcare/advanced directives.  She verbalized understanding and appreciation.  I discussed the  importance of continued conversation with family and their medical providers regarding overall plan of care and treatment options, ensuring decisions are within the context of the patients values and GOCs.  Questions and concerns were addressed.  Hard Choices booklet left for review. The family was encouraged to call with questions or concerns.  PMT will continue to support holistically as needed.  Life Review: Patient was previously living alone in the home however seems to have been declining and unable to manage.  He is widowed with 1 daughter, Daniel Andersen.  They are the only family each other has.  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    Objective:   Primary Diagnoses: Present on Admission:  Sepsis due to cellulitis [facility])  Essential hypertension  Acute renal failure (ARF) (HCC)  Macrocytic anemia  Pleural effusion, bilateral   Scheduled Meds:  (feeding supplement) PROSource Plus  30 mL Oral BID BM   amiodarone  200 mg Oral Daily   Chlorhexidine Gluconate Cloth  6 each Topical Daily   feeding supplement (NEPRO CARB STEADY)  237 mL Oral Q24H   furosemide  40 mg Intravenous Q12H   heparin  5,000 Units Subcutaneous Q8H   metoprolol tartrate  25 mg Oral BID   midodrine  5 mg Oral TID WC   multivitamin  1 tablet Oral QHS   sodium chloride flush  3 mL Intravenous Q12H    Continuous Infusions:  cefTRIAXone (ROCEPHIN)  IV 2 g (09/29/21 1002)   potassium chloride 10 mEq (09/29/21 1100)    PRN Meds: acetaminophen **OR** acetaminophen, melatonin, ondansetron **OR** ondansetron (ZOFRAN) IV, senna-docusate  No Known Allergies  Review of Systems  Constitutional:  Positive for appetite change.  Cardiovascular:  Positive for leg swelling.  Musculoskeletal:  Positive for arthralgias.  Neurological:  Positive for weakness.  Unless otherwise noted, a complete review of systems is negative.  Physical Exam General: NAD, frail -ill  appearing Cardiovascular: regular rate and rhythm, hypotensive Pulmonary: diminished bilaterally  Abdomen: soft, nontender, + bowel sounds Extremities: bilateral lower extremity edema R>L with leg wrap dry and in place Skin: warm and dry  Neurological: Alert and oriented x3, with occasional confusion noted but will recognize and answer appropriately   Vital Signs:  BP 107/67   Pulse (!) 104   Temp 98.2 F (36.8 C) (Oral)   Resp 15   Ht '5\' 8"'  (1.727 m)   Wt 75.7 kg   SpO2 100%   BMI 25.38 kg/m  Pain Scale: 0-10   Pain Score: 0-No pain  SpO2: SpO2: 100 % O2 Device:SpO2: 100 % O2 Flow Rate: .   IO: Intake/output summary:  Intake/Output Summary (Last 24 hours) at 09/29/2021 1134 Last data filed at 09/29/2021 1030 Gross per 24 hour  Intake 1633.04 ml  Output 2320 ml  Net -686.96 ml    LBM: Last BM Date: 09/28/21 Baseline Weight: Weight: 77.1 kg Most recent weight:  Weight: 75.7 kg      Palliative Assessment/Data: PPS 20-30%   Advanced Care Planning:   Primary Decision Maker: NEXT OF KIN-Daughter Daniel Andersen   Code Status/Advance Care Planning: DNR  A discussion was had today regarding advanced directives. Concepts specific to code status, artifical feeding and hydration, continued IV antibiotics and rehospitalization was had.   Mr. Lucarelli and his daughter confirms they would not want any forms of artificial feedings/nutrition. He does not have an advanced directive.   I discussed at length patient's current full CODE STATUS with consideration of his current illness, comorbidities, and poor prognosis.  Daughter initially expressed wishes for patient to remain a full code however after further conversations explaining in depth with heroic measures would look like with no meaningful recovery/survival in addition to his underlying conditions but still remain and prognosis of survival would be significantly less then currently expected she then expressed wishes for no heroic  measures.  Mr. Reser also seemed alert to the conversation and expressed if he is not going to be able to live he would not want to be on any machines.  Education provided on what DNR/DNI would look like for patient.  Daughter confirms wishes for DNR.  The difference between a aggressive medical intervention path and a palliative comfort care path was discussed. The MOST form was introduced and discussed. Daniel Andersen expressed patient cannot return home and live alone and unfortunately she is unable to care for him. She understands his poor prognosis and is asking about continuing to pursue rehab versus hospice.   Hospice and Palliative Care services outpatient were explained and offered. Patient and family verbalized their understanding and awareness of both palliative and hospice's goals and philosophy of care. Also discussed residential hospice with requirements of prognosis of 2 weeks or less. Daniel Andersen verbalized understanding.   Patient is most appropriate for hospice however he at this time would most likely not meet inpatient admission criteria. I do not think he will have any return benefit from SNF rehab however daughter wishes to pursue with awareness and plans to evaluate for decline. She inquires about transitioning to hospice once this occurs and per recommendations would request getting outpatient palliative involved to make transition to hospice more streamline and also having their support and guidance as she navigates patient's decisions. I did share if patient was to further decline while hospitalized he may meet inpatient hospice criteria and palliative will continue to closely follow. Daniel Andersen expresses appreciation.   Decisions/Changes to ACP: DNR/DNI   Assessment & Plan:   SUMMARY OF RECOMMENDATIONS   DNR/DNI-as confirmed by patient and his daughter, Daniel Andersen  Continue with current plan of care. Daughter would like to continue to treat the treatable with no escalation of care. She  acknowledges her understanding of poor prognosis and is accepting of this. She is focusing on getting his affairs in order. Would like to pursue rehab with understanding and plan if he further declines or when he does transition to a more comfort/hospice approach.  TOC referral for outpatient palliative at minimum.  PMT will continue to support and follow. If patient continues to decline and meet 2 weeks or less criteria he would then be most appropriate for residential hospice.  I highly expect this most likely will take place sooner than later.  Please call team line with urgent needs.   Palliative Prophylaxis:  Aspiration, Bowel Regimen, Delirium Protocol, Frequent Pain Assessment, Palliative Wound Care, and Turn Reposition  Additional Recommendations (Limitations, Scope, Preferences): No Artificial Feeding  and DNR, treat the treatable with no escalation   Psycho-social/Spiritual:  Desire for further Chaplaincy support: no Additional Recommendations: Education on Hospice  Prognosis:  < 3 months in the setting of widely metastatic prostate cancer with diffuse bone mets, PSA 246, malignant renal obstruction, renal failure, hypotension, deconditioned, poor appetite, sepsis, right lower extremity cellulitis, systolic CHF, atrial fibrillation with RVR, albumin 2.4, AST 52, ALT 95, BUN to 39, creatinine 4.46.  Discharge Planning:  To Be Determined     Patient and daughter expressed understanding and was in agreement with this plan.   Time Total: 75 min  Visit consisted of counseling and education dealing with the complex and emotionally intense issues of symptom management and palliative care in the setting of serious and potentially life-threatening illness.Greater than 50%  of this time was spent counseling and coordinating care related to the above assessment and plan.  Signed by:  Alda Lea, AGPCNP-BC Palliative Medicine Team  Phone: [phone] Pager:  [phone] Amion: Bjorn Pippin   Thank you for allowing the Palliative Medicine Team to assist in the care of this patient. Please utilize secure chat with additional questions, if there is no response within 30 minutes please call the above phone number. Palliative Medicine Team providers are available by phone from 7am to 5pm daily and can be reached through the team cell phone.  Should this patient require assistance outside of these hours, please call the patient's attending physician.

## 2021-09-29 NOTE — Progress Notes (Signed)
Subjective/Chief Complaint:  1 - Widely Metastatic Prostate Cancer - diffuse bone mets, PSA 246 on eval acute renal failure. Firmagon 240mg  given 09/27/21 as initial androgen deprivation.  2 - Malignant Renal Obstruction / Acute Renal Failure - Cr 5's on admit 09/2021 up from <1 1019, CT with bilateral hydro to distal ureters. He is very poor surgical candidate. Some but incomplete GFR recovery with catheter only  3 - Urinary Retention - s/p 60F coude catheter placement at presentation.   4 - End of Life Care - pt with profound heart failure, kidney failure, metastatic / incurable cancer, and terrible functional baseline. Per pt's daughter / [name] [phone] he was living independently prior to admission but becoming unable to care for himself, overdue bills, etc...  Today "Daniel Andersen" is seen in f/u above. GFR stable (but poor), Good UOP, K acceptable.   Objective: Vital signs in last 24 hours: Temp:  [97.3 F (36.3 C)-98.2 F (36.8 C)] 98.2 F (36.8 C) (12/11 0500) Pulse Rate:  [49-116] 104 (12/11 0500) Resp:  [15-24] 15 (12/11 0500) BP: (76-110)/(50-78) 107/67 (12/11 0500) SpO2:  [98 %-100 %] 100 % (12/11 0500) Weight:  [75.7 kg] 75.7 kg (12/11 0500) Last BM Date: 09/28/21  Intake/Output from previous day: 12/10 0701 - 12/11 0700 In: 1122.4 [P.O.:480; I.V.:442.4; IV Piggyback:200] Out: 1775 [Urine:1775] Intake/Output this shift: No intake/output data recorded.  EXAM: AOx2. Sleepy but arousable, this is much improved.  Mild increased WOB HR 90's by monitor Mild truncal obestiy, no CVAT, no r/g Mostly buried penis. Foley in place with non-foul urine Very large LE symmetric edema  Lab Results:  Recent Labs    09/28/21 0522 09/29/21 0518  WBC 14.3* 13.5*  HGB 10.1* 9.5*  HCT 30.2* 28.2*  PLT 142* 129*   BMET Recent Labs    09/28/21 0522 09/29/21 0518  NA 132* 134*  K 3.4* 2.8*  CL 98 98  CO2 17* 18*  GLUCOSE 134* 131*  BUN 231* 239*  CREATININE  4.55* 4.46*  CALCIUM 7.3* 7.1*   PT/INR No results for input(s): LABPROT, INR in the last 72 hours. ABG No results for input(s): PHART, HCO3 in the last 72 hours.  Invalid input(s): PCO2, PO2  Studies/Results: CT ABDOMEN PELVIS WO CONTRAST  Result Date: 09/27/2021 CLINICAL DATA:  Hydronephrosis EXAM: CT ABDOMEN AND PELVIS WITHOUT CONTRAST TECHNIQUE: Multidetector CT imaging of the abdomen and pelvis was performed following the standard protocol without IV contrast. COMPARISON:  Renal ultrasound dated 09/25/2021 FINDINGS: Motion degraded images. Lower chest: Moderate bilateral pleural effusions with compressive atelectasis. Hepatobiliary: Unenhanced liver is unremarkable. Gallbladder is unremarkable. No intrahepatic or extrahepatic ductal dilatation. Pancreas: Within normal limits. Spleen: Within normal limits. Adrenals/Urinary Tract: Adrenal glands are within normal limits. Kidneys are notable for a 17 mm left lower pole renal cyst (series 2/image 33). Punctate nonobstructing left lower pole renal calculus (series 2/image 28). Moderate bilateral hydroureteronephrosis to the level of the UVJ. Concentric but irregular masslike bladder wall thickening (series 2/image 62). Bladder is decompressed by an indwelling Foley catheter. Differential considerations include chronic bladder outlet obstruction/neurogenic bladder or infiltrating urothelial neoplasm. Stomach/Bowel: Stomach is within normal limits. No evidence of bowel obstruction. Normal appendix (series 2/image 47). Mild left colonic diverticulosis, without evidence of diverticulitis. Vascular/Lymphatic: No evidence of abdominal aortic aneurysm. Atherosclerotic calcifications of the abdominal aorta and branch vessels. No suspicious abdominopelvic lymphadenopathy. Reproductive: Prostate is notable for dystrophic calcifications and mild enlargement of the central gland, indenting the base of the bladder, suggesting BPH Other:  No abdominopelvic ascites.  Musculoskeletal: Multifocal sclerotic osseous metastases throughout the visualized axial and appendicular skeleton. This raises concern for nonvisualized primary malignancy such as prostate cancer. IMPRESSION: Multifocal sclerotic osseous metastases throughout the visualized axial hepatic color skeleton. This raises concern for nonvisualized primary malignancy such as prostate cancer. Correlate with PSA. Prostate is poorly evaluated on unenhanced CT.  Suspected BPH. Bladder decompressed by indwelling Foley catheter. Masslike concentric bladder wall thickening may reflect chronic bladder outlet obstruction/neurogenic bladder, but infiltrating urothelial neoplasm is not excluded. Consider cystoscopy as clinically warranted. Moderate bilateral hydroureteronephrosis to the level of the UVJ, presumably secondary to the findings above. Moderate bilateral pleural effusions, incompletely visualized. Electronically Signed   By: Julian Hy M.D.   On: 09/27/2021 19:52    Anti-infectives: Anti-infectives (From admission, onward)    Start     Dose/Rate Route Frequency Ordered Stop   09/27/21 1200  vancomycin (VANCOREADY) IVPB 750 mg/150 mL  Status:  Discontinued        750 mg 150 mL/hr over 60 Minutes Intravenous  Once 09/27/21 0818 09/27/21 0831   09/27/21 1000  vancomycin (VANCOREADY) IVPB 750 mg/150 mL  Status:  Discontinued        750 mg 150 mL/hr over 60 Minutes Intravenous  Once 09/27/21 0806 09/27/21 0818   09/27/21 1000  cefTRIAXone (ROCEPHIN) 2 g in sodium chloride 0.9 % 100 mL IVPB        2 g 200 mL/hr over 30 Minutes Intravenous Every 24 hours 09/27/21 0831     09/26/21 1800  ceFEPIme (MAXIPIME) 1 g in sodium chloride 0.9 % 100 mL IVPB  Status:  Discontinued        1 g 200 mL/hr over 30 Minutes Intravenous Every 24 hours 09/25/21 2122 09/27/21 0831   09/25/21 2122  vancomycin variable dose per unstable renal function (pharmacist dosing)  Status:  Discontinued         Does not apply See admin  instructions 09/25/21 2123 09/27/21 0831   09/25/21 1815  vancomycin (VANCOREADY) IVPB 1500 mg/300 mL        1,500 mg 150 mL/hr over 120 Minutes Intravenous  Once 09/25/21 1804 09/25/21 2106   09/25/21 1815  ceFEPIme (MAXIPIME) 2 g in sodium chloride 0.9 % 100 mL IVPB        2 g 200 mL/hr over 30 Minutes Intravenous  Once 09/25/21 1804 09/25/21 2035       Assessment/Plan:     Discussed frankly with daughter again by phone his very poor overall prognosis and options of max aggressive care (proceed with attempt ureteral stents or neph tubes) v. Palliative transition (recommended as minimal chance of meaningful functional recovery). Pt and daughter coming to understanding of competing risks. THey agree to hold off on surgery at this point which I do feel is reasinable as K acceptable and making urine. The intrisic obstruction will likely further improve as androgen deprivation kickes in .   Continue foley for now.  Agree with palliative consult. I estimate his live expectance <61mos, likely much less.     Alexis Frock 09/29/2021

## 2021-09-30 LAB — COMPREHENSIVE METABOLIC PANEL
ALT: 69 U/L — ABNORMAL HIGH (ref 0–44)
AST: 52 U/L — ABNORMAL HIGH (ref 15–41)
Albumin: 2.6 g/dL — ABNORMAL LOW (ref 3.5–5.0)
Alkaline Phosphatase: 146 U/L — ABNORMAL HIGH (ref 38–126)
Anion gap: 18 — ABNORMAL HIGH (ref 5–15)
BUN: 128 mg/dL — ABNORMAL HIGH (ref 8–23)
CO2: 17 mmol/L — ABNORMAL LOW (ref 22–32)
Calcium: 7.2 mg/dL — ABNORMAL LOW (ref 8.9–10.3)
Chloride: 97 mmol/L — ABNORMAL LOW (ref 98–111)
Creatinine, Ser: 4.46 mg/dL — ABNORMAL HIGH (ref 0.61–1.24)
GFR, Estimated: 12 mL/min — ABNORMAL LOW (ref >60)
Glucose, Bld: 124 mg/dL — ABNORMAL HIGH (ref 70–99)
Potassium: 2.9 mmol/L — ABNORMAL LOW (ref 3.5–5.1)
Sodium: 132 mmol/L — ABNORMAL LOW (ref 135–145)
Total Bilirubin: 0.6 mg/dL (ref 0.3–1.2)
Total Protein: 6 g/dL — ABNORMAL LOW (ref 6.5–8.1)

## 2021-09-30 LAB — CULTURE, BLOOD (ROUTINE X 2)
Culture: NO GROWTH
Culture: NO GROWTH
Special Requests: ADEQUATE

## 2021-09-30 LAB — CBC
HCT: 30.4 % — ABNORMAL LOW (ref 39.0–52.0)
Hemoglobin: 10 g/dL — ABNORMAL LOW (ref 13.0–17.0)
MCH: 34 pg (ref 26.0–34.0)
MCHC: 32.9 g/dL (ref 30.0–36.0)
MCV: 103.4 fL — ABNORMAL HIGH (ref 80.0–100.0)
Platelets: 136 10*3/uL — ABNORMAL LOW (ref 150–400)
RBC: 2.94 MIL/uL — ABNORMAL LOW (ref 4.22–5.81)
RDW: 13.9 % (ref 11.5–15.5)
WBC: 13.7 10*3/uL — ABNORMAL HIGH (ref 4.0–10.5)
nRBC: 0.1 % (ref 0.0–0.2)

## 2021-09-30 LAB — MAGNESIUM: Magnesium: 1.7 mg/dL (ref 1.7–2.4)

## 2021-09-30 MED ORDER — MAGNESIUM SULFATE 2 GM/50ML IV SOLN
2.0000 g | Freq: Once | INTRAVENOUS | Status: AC
  Administered 2021-09-30: 2 g via INTRAVENOUS
  Filled 2021-09-30: qty 50

## 2021-09-30 MED ORDER — POTASSIUM CHLORIDE CRYS ER 20 MEQ PO TBCR
40.0000 meq | EXTENDED_RELEASE_TABLET | ORAL | Status: AC
  Administered 2021-09-30 (×3): 40 meq via ORAL
  Filled 2021-09-30 (×3): qty 2

## 2021-09-30 NOTE — Progress Notes (Signed)
PROGRESS NOTE    Daniel Andersen  VBT:[id number] DOB: [birth year] DOA: 09/25/2021 PCP: Carlena Hurl, PA-C    Brief Narrative:  Daniel Andersen is a 87 year old male with past medical history significant for essential hypertension, and alcohol use who presented to [facility] ED on 12/7 from home via EMS due to altered mental status and worsening right lower extremity wound since Thanksgiving.  Patient has also reported worsening lower extremity edema with associated erythema since this timeframe as well.  Patient denies dysuria, no chest pain, no shortness of breath, no cough/congestion, no nausea/vomiting/diarrhea, no abdominal pain.  In the ED, BP 137/73, HR 99, RR 26, temperature 96.3 F, SPO2 96% on room air.  Sodium 134, potassium 5.0, chloride 100, CO2 17, glucose 135, BUN 115, creatinine 5.18, alkaline phosphatase 199, AST 71, ALT 157, total bilirubin 1.3.  BNP 3000 and urine 27.9.  Lactic acid 1.5.  WBC 22.6.  Hemoglobin 10.6, platelet count 180.  COVID-19 PCR negative.  Influenza A/B PCR negative.  EtOH level less than 10.  Chest x-ray with cardiomegaly, vascular congestion, pulmonary edema and bilateral effusions.  Right foot and tibia/fibula x-ray with no acute osseous abnormality.  Renal ultrasound with significant hydronephrosis bilaterally extending to the level of the urinary bladder suggestive of bladder outlet obstruction.  CT chest without contrast showed changes consistent with bony metastatic disease likely related to prostate carcinoma, bilateral pleural effusions with lower lobe atelectasis with no focal mass lesion noted.  Patient was started on IV vancomycin and cefepime and received 500 cc normal saline.  Hospital service consulted for further evaluation and management.   Assessment & Plan:   Principal Problem:   Sepsis due to cellulitis [facility]) Active Problems:   Essential hypertension   Acute renal failure (ARF) (HCC)   Macrocytic anemia   Pleural effusion,  bilateral   Sepsis, POA Right lower extremity cellulitis Patient presenting to the ED with leukocytosis, respiratory rate greater than 20, HR greater than 90 and significant right lower extremity cellulitis with large intact bullae present at the base of the right first toe.  WBC count elevated 22.6 with procalcitonin 6.98.  MRSA PCR negative.  Initially started on empiric antibiotics with vancomycin and cefepime, now transitioned to ceftriaxone. --WBC 22.6>17.2>14.3>13.5>13.7 --Procal 6.98>6.00>4.54 --Blood cultures x2: No growth x 5 days --Ceftriaxone 2 g IV q24h; plan 10-day total antibiotic course --Continue dressing changes per wound care --CBC daily  Acute renal failure Anion gap metabolic acidosis Obstructive uropathy Patient presenting with a creatinine of 5.18. FeNa = 0.5%; which is consistent with a prerenal etiology.  Additionally, patient with likely chronic outlet obstruction 2/2 BPH with likely prostate cancer complicating picture.  Renal ultrasound with significant hydronephrosis bilaterally extending to the level of the urinary bladder suggestive of bladder outlet obstruction. Foley catheter was placed.  CT abdomen/pelvis 12/9 with moderate bilateral hydroureteronephrosis to the level of UVJ.  Urology currently recommending voiding further interventions such as stent placement or nephrostomy tubes given his very poor prognosis and minimal chance of meaningful functional recovery. --Urology following, appreciate assistance --BUN 115>128>245>231>239>128 --Cr 5.18>4.93>4.79>4.55>4.46>4.46 --Continue Foley catheter --Urine output 2.3L past 24 hours --Strict I's and O's --Avoid nephrotoxins, renally dose all medications --Monitor renal function daily  Systolic congestive heart failure, new diagnosis with decompensation Patient with large bilateral pleural effusions, significant peripheral edema, and BNP 3827.9.  TTE with LVEF 25-30%, LV severely decreased function with global  hypokinesis, mild concentric LVH, LV diastolic parameters indeterminate, LA mildly dilated, RV systolic function severely reduced,  moderate pleural effusion left lateral region, trivial MR, IVC dilated.  Cardiology initially consulted now signed off as of 09/29/2021. --Cardiology following, appreciate assistance --net negative 620 mL past 24h, net negative 1.1L since admission --Metoprolol tartrate 25 mg p.o. twice daily --Furosemide 40 mg IV q12h --Fluid restriction 1200 mL/day --Strict I's and O's and daily weights  Atrial fibrillation with RVR TSH: 1.541 --Metoprolol tartrate 25 mg p.o. twice daily --amiodarone 200 mg p.o. daily --Heparin 5000u [place] q8h - started 10/10; cardiology recommends against anticoagulation given widely metastatic disease and need for likely a more palliative approach --Continue monitor on telemetry  Osseous metastatic lesions concerning for prostate cancer as primary Several osseous lesions noted on imaging, elevated alkaline phosphatase, andelevated PSA 246.08; highly concerning for prostate cancer.  CT abdomen/pelvis with multifocal sclerotic osseous metastatic lesions of the spine likely secondary to underlying prostate cancer. --Urology following --Started on androgen deprivation therapy 12/8 --Palliative care consulted for assistance with goals of care and medical decision making.  Macrocytic anemia Hemoglobin 10.6 with MCV 106.2 on admission.  Anemia panel with iron 15, TIBC 176, ferritin 322, B12 1404, folate 32.3.  Etiology likely secondary to anemia of chronic medical disease.  Elevated LFTs Etiology likely multifactorial, related to sepsis, congestive hepatopathy.  Also with elevated alkaline phosphatase, likely related to osseous lesions from suspected prostate cancer.  EtOH level less than 10.  Right upper quadrant ultrasound with no focal liver lesion identified, parenchymal echogenicity density within normal limits, portal vein patent.  Alcohol  cessation.  Acute hepatitis panel negative. --AST 71>56>45>52>33>52 --ALT 157>128>102>95>67>69 --Avoid hepatotoxins --CMP daily  Hypokalemia Potassium 2.9, magnesium 1.7.  Will replete potassium/mag today. --Repeat electrolytes in a.m.  Alcohol use disorder Patient reports drinking 1-2 beers daily, denies any use within the last week.  Counseled on need for cessation --CIWA protocol  DVT prophylaxis: heparin injection 5,000 Units Start: 09/25/21 2200   Code Status: DNR Family Communication: No family present at bedside this morning Disposition Plan:  Level ofcare: Telemetry Status is: Inpatient  Remains inpatient appropriate because: Continues on IV antibiotics for sepsis related to severe cellulitis right lower extremity, on IV diuresis for new diagnosis of systolic congestive heart failure,.  Awaiting palliative care consultation given overall extremely poor prognosis with less than 6 months of life remaining.     Consultants:  Urology Cardiology -signed off 09/30/19/2022 Palliative care  Procedures:  Foley catheter placement 12/7 TTE  Antimicrobials:  Vancomycin 12/7 - 12/9 Cefepime 12/7 - 12/9 Ceftriaxone 12/9>>   Subjective: Patient seen examined at bedside, lying in bed.  Asking for beer, then asking for orange juice and chocolate ice cream.  No specific complaints otherwise this morning.  States he feels okay.  Awaiting palliative care consult, given overall very poor prognosis.  No significant improvement in creatinine past 24 hours but BUN decreased; and urine output increasing.  Denies headache, no chest pain, no shortness of breath, no abdominal pain.  No acute events overnight per nursing staff.   Objective: Vitals:   09/30/21 0000 09/30/21 0500 09/30/21 0547 09/30/21 0919  BP: (!) 100/57  (!) 105/56 (!) 117/51  Pulse: 92  (!) 112 (!) 109  Resp: (!) 21  20   Temp: 98.2 F (36.8 C)  98 F (36.7 C)   TempSrc: Oral  Oral   SpO2: 100%  99%   Weight: 76  kg 75.6 kg    Height:        Intake/Output Summary (Last 24 hours) at 09/30/2021 1054 Last data  filed at 09/30/2021 0500 Gross per 24 hour  Intake 1205.11 ml  Output 1775 ml  Net -569.89 ml   Filed Weights   09/29/21 0500 09/30/21 0000 09/30/21 0500  Weight: 75.7 kg 76 kg 75.6 kg    Examination:  General exam: Appears calm and comfortable, appears older than stated age, chronically ill in appearance Respiratory system: Breath sounds slightly decreased bilateral bases, mild crackles, no wheezing, normal respiratory effort without accessory muscle use, on room air with SPO2 100% at rest Cardiovascular system: S1 & S2 heard, tachycardic, irregularly irregular rhythm. No JVD, murmurs, rubs, gallops or clicks.  Gastrointestinal system: Abdomen is nondistended, soft and nontender. No organomegaly or masses felt. Normal bowel sounds heard. Central nervous system: . No focal neurological deficits. Extremities: Moves all extremities independently, 3+ pitting edema bilateral lower extremities up to knee with erythema to right lower extremity; currently right lower extremity with dressing in place, clean/dry/intact Skin: Right lower extremity with erythema from right foot extending proximally about two thirds up to the knee with large intact bullae dorsal aspect of base of first toe with significant bilateral edema Psychiatry: Judgement and insight appear poor. Mood & affect appropriate.          Data Reviewed: I have personally reviewed following labs and imaging studies  CBC: Recent Labs  Lab 09/25/21 1835 09/27/21 0536 09/28/21 0522 09/29/21 0518 09/30/21 0449  WBC 22.6* 17.2* 14.3* 13.5* 13.7*  NEUTROABS 20.3*  --   --   --   --   HGB 10.6* 9.8* 10.1* 9.5* 10.0*  HCT 32.7* 29.3* 30.2* 28.2* 30.4*  MCV 106.2* 100.7* 101.3* 101.4* 103.4*  PLT 180 132* 142* 129* 665*   Basic Metabolic Panel: Recent Labs  Lab 09/26/21 0605 09/27/21 0536 09/28/21 0522 09/29/21 0518  09/30/21 0449  NA 134* 134* 132* 134* 132*  K 4.4 3.5 3.4* 2.8* 2.9*  CL 103 103 98 98 97*  CO2 15* 17* 17* 18* 17*  GLUCOSE 108* 115* 134* 131* 124*  BUN 128* 245* 231* 239* 128*  CREATININE 4.93* 4.79* 4.55* 4.46* 4.46*  CALCIUM 8.2* 7.6* 7.3* 7.1* 7.2*  MG 2.2 2.0  --  2.0 1.7   GFR: Estimated Creatinine Clearance: 11.3 mL/min (A) (by C-G formula based on SCr of 4.46 mg/dL (H)). Liver Function Tests: Recent Labs  Lab 09/26/21 0605 09/27/21 0536 09/28/21 0522 09/29/21 0518 09/30/21 0449  AST 56* 45* 52* 33 52*  ALT 128* 102* 95* 67* 69*  ALKPHOS 171* 140* 151* 126 146*  BILITOT 1.2 0.8 0.6 0.3 0.6  PROT 6.3* 5.7* 6.1* 6.0* 6.0*  ALBUMIN 2.6* 2.4* 2.4* 2.7* 2.6*   No results for input(s): LIPASE, AMYLASE in the last 168 hours. No results for input(s): AMMONIA in the last 168 hours. Coagulation Profile: Recent Labs  Lab 09/25/21 1835 09/26/21 0605  INR 2.4* 2.1*   Cardiac Enzymes: No results for input(s): CKTOTAL, CKMB, CKMBINDEX, TROPONINI in the last 168 hours. BNP (last 3 results) No results for input(s): PROBNP in the last 8760 hours. HbA1C: No results for input(s): HGBA1C in the last 72 hours. CBG: Recent Labs  Lab 09/25/21 1815 09/26/21 2049 09/27/21 0726 09/27/21 1142  GLUCAP 126* 107* 111* 175*   Lipid Profile: No results for input(s): CHOL, HDL, LDLCALC, TRIG, CHOLHDL, LDLDIRECT in the last 72 hours. Thyroid Function Tests: No results for input(s): TSH, T4TOTAL, FREET4, T3FREE, THYROIDAB in the last 72 hours.  Anemia Panel: No results for input(s): VITAMINB12, FOLATE, FERRITIN, TIBC, IRON, RETICCTPCT in the  last 72 hours.  Sepsis Labs: Recent Labs  Lab 09/25/21 1835 09/26/21 0605 09/27/21 0536 09/28/21 0522  PROCALCITON  --  6.98 6.00 4.54  LATICACIDVEN 1.5  --   --   --     Recent Results (from the past 240 hour(s))  Culture, blood (routine x 2)     Status: None   Collection Time: 09/25/21  6:35 PM   Specimen: BLOOD  Result Value  Ref Range Status   Specimen Description   Final    BLOOD SITE NOT SPECIFIED Performed at [facility] [address], [place], [place] [address]    Special Requests   Final    BOTTLES DRAWN AEROBIC AND ANAEROBIC Blood Culture adequate volume Performed at [facility] [address], [place], [place] [address]    Culture   Final    NO GROWTH 5 DAYS Performed at [facility], [place] [address] [place], [place] [address]    Report Status 09/30/2021 FINAL  Final  Culture, blood (routine x 2)     Status: None   Collection Time: 09/25/21  6:35 PM   Specimen: BLOOD  Result Value Ref Range Status   Specimen Description   Final    BLOOD BLOOD LEFT HAND Performed at [facility] [address] [place], [place] [address]    Special Requests   Final    BOTTLES DRAWN AEROBIC AND ANAEROBIC Blood Culture results may not be optimal due to an inadequate volume of blood received in culture bottles Performed at [facility] [address] [place],  [address]    Culture   Final    NO GROWTH 5 DAYS Performed at [facility], [place] [address], [place],  [address]    Report Status 09/30/2021 FINAL  Final  Resp Panel by RT-PCR (Flu A&B, Covid) Peripheral     Status: None   Collection Time: 09/25/21  6:35 PM   Specimen: Peripheral; Nasopharyngeal(NP) swabs in vial transport medium  Result Value Ref Range Status   SARS Coronavirus 2 by RT PCR NEGATIVE NEGATIVE Final    Comment: (NOTE) SARS-CoV-2 target nucleic acids are NOT DETECTED.  The SARS-CoV-2 RNA is generally detectable in upper respiratory specimens during the acute phase of infection. The lowest concentration of SARS-CoV-2 viral copies this assay can detect is 138 copies/mL. A negative result does not preclude SARS-Cov-2 infection and should not be used as the sole basis for treatment or other patient management decisions. A negative  result may occur with  improper specimen collection/handling, submission of specimen other than nasopharyngeal swab, presence of viral mutation(s) within the areas targeted by this assay, and inadequate number of viral copies(<138 copies/mL). A negative result must be combined with clinical observations, patient history, and epidemiological information. The expected result is Negative.  Fact Sheet for Patients:  EntrepreneurPulse.com.au  Fact Sheet for Healthcare Providers:  IncredibleEmployment.be  This test is no t yet approved or cleared by the [place] FDA and  has been authorized for detection and/or diagnosis of SARS-CoV-2 by FDA under an Emergency Use Authorization (EUA). This EUA will remain  in effect (meaning this test can be used) for the duration of the COVID-19 declaration under Section 564(b)(1) of the Act, 21 U.S.C.section 360bbb-3(b)(1), unless the authorization is terminated  or revoked sooner.       Influenza A by PCR NEGATIVE NEGATIVE Final   Influenza B by PCR NEGATIVE NEGATIVE Final    Comment: (  NOTE) The Xpert Xpress SARS-CoV-2/FLU/RSV plus assay is intended as an aid in the diagnosis of influenza from Nasopharyngeal swab specimens and should not be used as a sole basis for treatment. Nasal washings and aspirates are unacceptable for Xpert Xpress SARS-CoV-2/FLU/RSV testing.  Fact Sheet for Patients: EntrepreneurPulse.com.au  Fact Sheet for Healthcare Providers: IncredibleEmployment.be  This test is not yet approved or cleared by the [place] FDA and has been authorized for detection and/or diagnosis of SARS-CoV-2 by FDA under an Emergency Use Authorization (EUA). This EUA will remain in effect (meaning this test can be used) for the duration of the COVID-19 declaration under Section 564(b)(1) of the Act, 21 U.S.C. section 360bbb-3(b)(1), unless the authorization is  terminated or revoked.  Performed at [facility], [place] [address] [place], [place] [address]   MRSA Next Gen by PCR, Nasal     Status: None   Collection Time: 09/26/21  2:22 PM   Specimen: Nasal Mucosa; Nasal Swab  Result Value Ref Range Status   MRSA by PCR Next Gen NOT DETECTED NOT DETECTED Final    Comment: (NOTE) The GeneXpert MRSA Assay (FDA approved for NASAL specimens only), is one component of a comprehensive MRSA colonization surveillance program. It is not intended to diagnose MRSA infection nor to guide or monitor treatment for MRSA infections. Test performance is not FDA approved in patients less than 57 years old. Performed at [facility], [place] [address], [place], [place] [address]          Radiology Studies: No results found.      Scheduled Meds:  (feeding supplement) PROSource Plus  30 mL Oral BID BM   amiodarone  200 mg Oral Daily   Chlorhexidine Gluconate Cloth  6 each Topical Daily   feeding supplement (NEPRO CARB STEADY)  237 mL Oral Q24H   furosemide  40 mg Intravenous Q12H   heparin  5,000 Units Subcutaneous Q8H   metoprolol tartrate  25 mg Oral BID   midodrine  5 mg Oral TID WC   multivitamin  1 tablet Oral QHS   potassium chloride  40 mEq Oral Q4H   sodium chloride flush  3 mL Intravenous Q12H   Continuous Infusions:  cefTRIAXone (ROCEPHIN)  IV 2 g (09/30/21 0921)     LOS: 5 days    Time spent: 39 minutes spent on chart review, discussion with nursing staff, consultants, updating family and interview/physical exam; more than 50% of that time was spent in counseling and/or coordination of care.    Jibran Crookshanks J [place] DO [facility] Available via Epic secure chat 7am-7pm After these hours, please refer to coverage provider listed on [email or web address] 09/30/2021, 10:54 AM

## 2021-09-30 NOTE — Care Management Important Message (Signed)
Important Message  Patient Details IM Letter given to the Patient. Name: Daniel Andersen MRN: [id number] Date of Birth: [birth year]   Medicare Important Message Given:  Yes     Kerin Salen 09/30/2021, 2:26 PM

## 2021-09-30 NOTE — Progress Notes (Signed)
Pt refuses to get cleaned up after BM. Only agreed to peri and foley care. Will try again later.

## 2021-09-30 NOTE — TOC Progression Note (Addendum)
Transition of Care [facility]) - Progression Note    Patient Details  Name: Daniel Andersen MRN: [id number] Date of Birth: [birth year]  [facility]) CM/SW Contact  Ramel Tobon, Juliann Pulse, RN Phone Number: 09/30/2021, 10:30 AM  Clinical Narrative: Await palliative care cons. Informed dtr Gloria-HCPOA can be done by our chaplain dept if needed.   3:46p-Left vm w/Gloria-to discuss recc-SNF w/otpt palliative care-await response.    Expected Discharge Plan:  (TBD) Barriers to Discharge: Continued Medical Work up  Expected Discharge Plan and Services Expected Discharge Plan:  (TBD)   Discharge Planning Services: CM Consult   Living arrangements for the past 2 months: Single Family Home                                       Social Determinants of Health (SDOH) Interventions    Readmission Risk Interventions No flowsheet data found.

## 2021-10-01 LAB — CBC
HCT: 24.9 % — ABNORMAL LOW (ref 39.0–52.0)
Hemoglobin: 8.5 g/dL — ABNORMAL LOW (ref 13.0–17.0)
MCH: 34.4 pg — ABNORMAL HIGH (ref 26.0–34.0)
MCHC: 34.1 g/dL (ref 30.0–36.0)
MCV: 100.8 fL — ABNORMAL HIGH (ref 80.0–100.0)
Platelets: 140 10*3/uL — ABNORMAL LOW (ref 150–400)
RBC: 2.47 MIL/uL — ABNORMAL LOW (ref 4.22–5.81)
RDW: 14 % (ref 11.5–15.5)
WBC: 17.2 10*3/uL — ABNORMAL HIGH (ref 4.0–10.5)
nRBC: 0.2 % (ref 0.0–0.2)

## 2021-10-01 LAB — COMPREHENSIVE METABOLIC PANEL
ALT: 56 U/L — ABNORMAL HIGH (ref 0–44)
AST: 45 U/L — ABNORMAL HIGH (ref 15–41)
Albumin: 2.3 g/dL — ABNORMAL LOW (ref 3.5–5.0)
Alkaline Phosphatase: 124 U/L (ref 38–126)
Anion gap: 14 (ref 5–15)
BUN: 150 mg/dL — ABNORMAL HIGH (ref 8–23)
CO2: 18 mmol/L — ABNORMAL LOW (ref 22–32)
Calcium: 7.1 mg/dL — ABNORMAL LOW (ref 8.9–10.3)
Chloride: 102 mmol/L (ref 98–111)
Creatinine, Ser: 4.19 mg/dL — ABNORMAL HIGH (ref 0.61–1.24)
GFR, Estimated: 13 mL/min — ABNORMAL LOW (ref >60)
Glucose, Bld: 135 mg/dL — ABNORMAL HIGH (ref 70–99)
Potassium: 3.8 mmol/L (ref 3.5–5.1)
Sodium: 134 mmol/L — ABNORMAL LOW (ref 135–145)
Total Bilirubin: 0.4 mg/dL (ref 0.3–1.2)
Total Protein: 5.3 g/dL — ABNORMAL LOW (ref 6.5–8.1)

## 2021-10-01 LAB — PREPARE RBC (CROSSMATCH)

## 2021-10-01 LAB — HEMOGLOBIN AND HEMATOCRIT, BLOOD
HCT: 25.2 % — ABNORMAL LOW (ref 39.0–52.0)
Hemoglobin: 8.4 g/dL — ABNORMAL LOW (ref 13.0–17.0)

## 2021-10-01 LAB — MAGNESIUM: Magnesium: 2 mg/dL (ref 1.7–2.4)

## 2021-10-01 MED ORDER — ONDANSETRON HCL 4 MG/2ML IJ SOLN
4.0000 mg | Freq: Four times a day (QID) | INTRAMUSCULAR | Status: DC
Start: 2021-10-01 — End: 2021-10-01

## 2021-10-01 MED ORDER — PANTOPRAZOLE SODIUM 40 MG IV SOLR
40.0000 mg | Freq: Two times a day (BID) | INTRAVENOUS | Status: DC
  Administered 2021-10-01 – 2021-10-08 (×15): 40 mg via INTRAVENOUS
  Filled 2021-10-01 (×16): qty 40

## 2021-10-01 MED ORDER — SODIUM CHLORIDE 0.9% IV SOLUTION: Freq: Once | INTRAVENOUS | Status: AC

## 2021-10-01 NOTE — Progress Notes (Signed)
Chaplain engaged in an initial visit with Daniel Andersen and his daughter, Daniel Andersen.  Chaplain was able to get Healthcare POA notarized.  Chaplain checked in with nurse concerning [name]' ability to sign paperwork and she noted that he was coherent and aware.  Chaplain provided three copies of Healthcare POA paperwork to [name] and input one in his medical chart.    Chaplain provided presence, support, and listening.     10/01/21 1000  Clinical Encounter Type  Visited With Patient and family together  Visit Type Initial;Social support

## 2021-10-01 NOTE — TOC Progression Note (Signed)
Transition of Care [facility]) - Progression Note    Patient Details  Name: Daniel Andersen MRN: [id number] Date of Birth: 05/06/1932  Transition of Care [facility]) CM/SW Contact  Mike Hamre, Juliann Pulse, RN Phone Number: 10/01/2021, 3:53 PM  Clinical Narrative: Faxed out await bed offers per agreement from Dtr [name].      Expected Discharge Plan: [place] Barriers to Discharge: Continued Medical Work up  Expected Discharge Plan and Services Expected Discharge Plan: [place]   Discharge Planning Services: CM Consult   Living arrangements for the past 2 months: Single Family Home                                       Social Determinants of Health (SDOH) Interventions    Readmission Risk Interventions No flowsheet data found.

## 2021-10-01 NOTE — Progress Notes (Signed)
Palliative Medicine Inpatient Follow Up Note     Chart Reviewed. Patient assessed at the bedside.   [name] continues to be frail and weak. Not really participating in PT/OT and is often refusing. When I ask him about this he states he just wants to be left alone. Given his disease trajectory and burden he is most likely weak and has no energy or drive to do anything other than rest and be comfortable. He states he does have some aches but unable to specify exact locations.   No family at the bedside. I spoke with daughter and provided updates. I again recommended focusing on [name]'s comfort and consideration for outpatient hospice support. Based on his worsening renal functions, drop in hemoglobin, poor appetite, failure to thrive, and poor prognosis [name] could possibly meet inpatient hospice criteria. He will most likely continue to decline in his health and reach end-of-life sooner rather than later. I expect his hemoglobin will continue to drop in the upcoming days with worsening renal. Albumin 2.3.   Education provided on hospice. Mr.Daniel Andersen will not make a meaningful recovery and most likely will not benefit from ongoing therapies or interventions. He will continue to refuse if he is able to and if he was to agree it will be with minimal response or improvement. His prognosis is poor with weeks-days optimistically.   Daniel Andersen verbalizes understanding however continues to request consideration for SNF rehab. She is most concerned about patient being sent back home and trying to "clean up" his overdue bills and arrange for the future. I expressed concerns if patient goes to rehab he will be rehospitalized in addition to he is at high risk of sudden decline and/or death. She verbalizes understanding and confirms DNR/DNI.   Daughter wishes to continue taking things one day at a time with request for SNF consideration.   Discussed the importance of continued conversation with family and  their  medical providers regarding overall plan of care and treatment options, ensuring decisions are within the context of the patients values and GOCs.   Questions addressed and support provided.    Objective Assessment: Vital Signs Vitals:   10/01/21 0350 10/01/21 0824  BP: (!) 101/53 107/64  Pulse: 66 88  Resp: 20   Temp: 98.3 F (36.8 C)   SpO2: 100%     Intake/Output Summary (Last 24 hours) at 10/01/2021 1222 Last data filed at 10/01/2021 6761 Gross per 24 hour  Intake 120 ml  Output 3150 ml  Net -3030 ml   Last Weight  Most recent update: 10/01/2021  3:57 AM    Weight  76.3 kg (168 lb 3.4 oz)            Gen:  NAD, frail, chronically ill-appearing  HEENT: moist mucous membranes CV: Tachycardic, Irregular  PULM: diminished bilaterally  ABD: soft/nontender/nondistended/normal bowel sounds EXT: bilateral lower extremity edema, RLE erythema, dressing intact Neuro: alert and awake, poor insight   SUMMARY OF RECOMMENDATIONS   Continue with current plan of care per medical team. Extensive discussions with daughter regarding patient's poor prognosis and minimal chance at meaningful recovery. Although she verbalized understanding continues to request SNF rehab consideration in fear he will be sent home at some point. Education provided and recommendations given for hospice support.  [name] would be hospice appropriate in the setting family transitioned care to focus on his comfort. His prognosis remains poor despite interventions.   PMT will continue to support and follow to assist in ongoing goals  of care discussions and complex decision making. Please secure chat for urgent needs.   Time Total: 45 min.   Visit consisted of counseling and education dealing with the complex and emotionally intense issues of symptom management and palliative care in the setting of serious and potentially life-threatening illness.Greater than 50%  of this time was spent counseling and  coordinating care related to the above assessment and plan.  Alda Lea, AGPCNP-BC  Palliative Medicine Team [phone]  Palliative Medicine Team providers are available by phone from 7am to 7pm daily and can be reached through the team cell phone. Should this patient require assistance outside of these hours, please call the patient's attending physician.

## 2021-10-01 NOTE — Progress Notes (Signed)
OT Cancellation Note  Patient Details Name: Daniel Andersen MRN: [id number] DOB: [birth year]   Cancelled Treatment:    Reason Eval/Treat Not Completed: Other (comment) Patient was approached with patient refusing to participate again on this date.no real reason given. OT to sign off at this time. Please re order if patient becomes more participatory. Thank you Jackelyn Poling OTR/L, MS Acute Rehabilitation Department Office# [phone] Pager# [phone]   10/01/2021, 10:26 AM

## 2021-10-01 NOTE — Progress Notes (Signed)
PT Cancellation Note  Patient Details Name: Daniel Andersen MRN: [id number] DOB: 05/30/1932   Cancelled Treatment:    Reason Eval/Treat Not Completed:  (Attempted PT tx session-pt declined to participate with therapy. Will check back another day.  *Pt has refused therapy on several occasions. Participation has been little to none. PT will continue to check back another time or two but if pt continues to refuse participation, we will sign off.*

## 2021-10-01 NOTE — NC FL2 (Signed)
[place] LEVEL OF CARE SCREENING TOOL     IDENTIFICATION  Patient Name: Daniel Andersen Birthdate: [birth year] Sex: male Admission Date (Current Location): 09/25/2021  [facility] [place] and [place] Number:  Herbalist and Address:  [facility],  [place], [place]      Provider Number: [id number]  Attending Physician Name and Address:  [place] Eric J, DO  Relative Name and Phone Number:  [name] [phone] [phone]    Current Level of Care: Hospital Recommended Level of Care: [place] Prior Approval Number:    Date Approved/Denied:   PASRR Number: [id number]  Discharge Plan: SNF    Current Diagnoses: Patient Active Problem List   Diagnosis Date Noted   Sepsis due to cellulitis ([place]) 09/25/2021   Acute renal failure (ARF) ([place]) 09/25/2021   Macrocytic anemia 09/25/2021   Pleural effusion, bilateral 09/25/2021   Decreased mobility 02/24/2018   Leukocytosis 02/24/2018   Vitamin D deficiency 01/12/2018   History of fracture 01/12/2018   History of fall 01/12/2018   Edema 01/12/2018   Traumatic rupture of right quadriceps tendon 05/18/2017   Closed fracture of right patella 05/18/2017   Closed displaced fracture of phalanx of great toe, initial encounter 05/18/2017   Medicare annual wellness visit, subsequent 05/06/2016   Vaccine counseling 05/06/2016   Essential hypertension 04/02/2015   Impaired fasting glucose 04/02/2015   History of colonic polyps 04/02/2015   Osteopenia 04/02/2015   Obesity 04/02/2015    Orientation RESPIRATION BLADDER Height & Weight     Self, Time, Situation, Place  Normal Indwelling catheter Weight: 76.3 kg Height:  5\' 8"  (172.7 cm)  BEHAVIORAL SYMPTOMS/MOOD NEUROLOGICAL BOWEL NUTRITION STATUS      Incontinent Diet (Renal-1200 fluid restriction)  AMBULATORY STATUS COMMUNICATION OF NEEDS Skin   Limited Assist Verbally Normal, PU Stage and Appropriate Care (RLE wound) PU  Stage 1 Dressing: Daily                     Personal Care Assistance Level of Assistance  Bathing, Feeding, Dressing Bathing Assistance: Limited assistance Feeding assistance: Limited assistance Dressing Assistance: Limited assistance     Functional Limitations Info  Sight, Hearing, Speech Sight Info: Impaired (eyeglasses)   Speech Info: Adequate    SPECIAL CARE FACTORS FREQUENCY  PT (By licensed PT), OT (By licensed OT)     PT Frequency:  (5x week) OT Frequency:  (5x week)            Contractures Contractures Info: Not present    Additional Factors Info  Code Status, Allergies Code Status Info:  (DNR) Allergies Info:  (NKA)           Current Medications (10/01/2021):  This is the current hospital active medication list Current Facility-Administered Medications  Medication Dose Route Frequency Provider Last Rate Last Admin   (feeding supplement) PROSource Plus liquid 30 mL  30 mL Oral BID BM [place] Donnamarie Poag, DO   30 mL at 10/01/21 0825   acetaminophen (TYLENOL) tablet 650 mg  650 mg Oral Q6H PRN Lenore Cordia, MD       Or   acetaminophen (TYLENOL) suppository 650 mg  650 mg Rectal Q6H PRN Lenore Cordia, MD       amiodarone (PACERONE) tablet 200 mg  200 mg Oral Daily Josue Hector, MD   200 mg at 10/01/21 0824   cefTRIAXone (ROCEPHIN) 2 g in sodium chloride 0.9 % 100 mL IVPB  2 g Intravenous Q24H [place] Eric J, DO 200 mL/hr at 10/01/21 0836 2 g at 10/01/21 0836   Chlorhexidine Gluconate Cloth 2 % PADS 6 each  6 each Topical Daily [place] Eric J, DO   6 each at 10/01/21 1624   feeding supplement (NEPRO CARB STEADY) liquid 237 mL  237 mL Oral Q24H [place] Eric J, DO   237 mL at 10/01/21 1337   furosemide (LASIX) injection 40 mg  40 mg Intravenous Q12H Zada Finders R, MD   40 mg at 10/01/21 4695   melatonin tablet 3 mg  3 mg Oral QHS PRN Lenore Cordia, MD   3 mg at 09/28/21 2220   metoprolol tartrate (LOPRESSOR) tablet 25 mg  25 mg Oral BID [place]  Eric J, DO   25 mg at 10/01/21 0722   midodrine (PROAMATINE) tablet 5 mg  5 mg Oral TID WC [place] Donnamarie Poag, DO   5 mg at 10/01/21 1336   multivitamin (RENA-VIT) tablet 1 tablet  1 tablet Oral QHS [place] Eric J, DO   1 tablet at 09/30/21 2155   ondansetron (ZOFRAN) tablet 4 mg  4 mg Oral Q6H PRN Lenore Cordia, MD       Or   ondansetron (ZOFRAN) injection 4 mg  4 mg Intravenous Q6H PRN Lenore Cordia, MD       pantoprazole (PROTONIX) injection 40 mg  40 mg Intravenous Q12H [place] Donnamarie Poag, DO   40 mg at 10/01/21 1336   senna-docusate (Senokot-S) tablet 1 tablet  1 tablet Oral QHS PRN Lenore Cordia, MD       sodium chloride flush (NS) 0.9 % injection 3 mL  3 mL Intravenous Q12H Lenore Cordia, MD   3 mL at 10/01/21 1337     Discharge Medications: Please see discharge summary for a list of discharge medications.  Relevant Imaging Results:  Relevant Lab Results:   Additional Information SS[address][id number] [place], [place]

## 2021-10-01 NOTE — Progress Notes (Signed)
PROGRESS NOTE    Daniel Andersen  UEA:[id number] DOB: 02-10-1932 DOA: 09/25/2021 PCP: Carlena Hurl, PA-C    Brief Narrative:  Daniel Andersen is a 87 year old male with past medical history significant for essential hypertension, and alcohol use who presented to [facility] ED on 12/7 from home via EMS due to altered mental status and worsening right lower extremity wound since Thanksgiving.  Patient has also reported worsening lower extremity edema with associated erythema since this timeframe as well.  Patient denies dysuria, no chest pain, no shortness of breath, no cough/congestion, no nausea/vomiting/diarrhea, no abdominal pain.  In the ED, BP 137/73, HR 99, RR 26, temperature 96.3 F, SPO2 96% on room air.  Sodium 134, potassium 5.0, chloride 100, CO2 17, glucose 135, BUN 115, creatinine 5.18, alkaline phosphatase 199, AST 71, ALT 157, total bilirubin 1.3.  BNP 3000 and urine 27.9.  Lactic acid 1.5.  WBC 22.6.  Hemoglobin 10.6, platelet count 180.  COVID-19 PCR negative.  Influenza A/B PCR negative.  EtOH level less than 10.  Chest x-ray with cardiomegaly, vascular congestion, pulmonary edema and bilateral effusions.  Right foot and tibia/fibula x-ray with no acute osseous abnormality.  Renal ultrasound with significant hydronephrosis bilaterally extending to the level of the urinary bladder suggestive of bladder outlet obstruction.  CT chest without contrast showed changes consistent with bony metastatic disease likely related to prostate carcinoma, bilateral pleural effusions with lower lobe atelectasis with no focal mass lesion noted.  Patient was started on IV vancomycin and cefepime and received 500 cc normal saline.  Hospital service consulted for further evaluation and management.   Assessment & Plan:   Principal Problem:   Sepsis due to cellulitis [facility]) Active Problems:   Essential hypertension   Acute renal failure (ARF) (HCC)   Macrocytic anemia   Pleural effusion,  bilateral   Sepsis, POA Right lower extremity cellulitis Patient presenting to the ED with leukocytosis, respiratory rate greater than 20, HR greater than 90 and significant right lower extremity cellulitis with large intact bullae present at the base of the right first toe.  WBC count elevated 22.6 with procalcitonin 6.98.  MRSA PCR negative.  Initially started on empiric antibiotics with vancomycin and cefepime, now transitioned to ceftriaxone. Blood cultures x2: no growth x 5 days --Ceftriaxone 2 g IV q24h; plan 10-day total antibiotic course --Continue dressing changes per wound care --CBC daily  Acute renal failure Anion gap metabolic acidosis Obstructive uropathy Patient presenting with a creatinine of 5.18. FeNa = 0.5%; which is consistent with a prerenal etiology.  Additionally, patient with likely chronic outlet obstruction 2/2 BPH with likely prostate cancer complicating picture.  Renal ultrasound with significant hydronephrosis bilaterally extending to the level of the urinary bladder suggestive of bladder outlet obstruction. Foley catheter was placed.  CT abdomen/pelvis 12/9 with moderate bilateral hydroureteronephrosis to the level of UVJ.  Urology currently recommending voiding further interventions such as stent placement or nephrostomy tubes given his very poor prognosis and minimal chance of meaningful functional recovery. --Urology following, appreciate assistance --BUN 115>128>245>231>239>128>150 --Cr 5.18>4.93>4.79>4.55>4.46>4.46>4.19 --Continue Foley catheter --Urine output 3.2L past 24 hours --Strict I's and O's --Avoid nephrotoxins, renally dose all medications --Monitor renal function daily  Systolic congestive heart failure, new diagnosis with decompensation Patient with large bilateral pleural effusions, significant peripheral edema, and BNP 3827.9.  TTE with LVEF 25-30%, LV severely decreased function with global hypokinesis, mild concentric LVH, LV diastolic  parameters indeterminate, LA mildly dilated, RV systolic function severely reduced, moderate pleural effusion left  lateral region, trivial MR, IVC dilated.  Cardiology initially consulted now signed off as of 09/29/2021. --Cardiology following, appreciate assistance --net negative 2.6L past 24h, net negative 3.6L since admission --Metoprolol tartrate 25 mg p.o. twice daily --Furosemide 40 mg IV q12h --Fluid restriction 1200 mL/day --Strict I's and O's and daily weights  Atrial fibrillation with RVR TSH: 1.541 --Metoprolol tartrate 25 mg p.o. twice daily --amiodarone 200 mg p.o. daily --Continue monitor on telemetry  Osseous metastatic lesions concerning for prostate cancer as primary Several osseous lesions noted on imaging, elevated alkaline phosphatase, andelevated PSA 246.08; highly concerning for prostate cancer.  CT abdomen/pelvis with multifocal sclerotic osseous metastatic lesions of the spine likely secondary to underlying prostate cancer. --Urology following --Started on androgen deprivation therapy 12/8 --Palliative care consulted for assistance with goals of care and medical decision making.  Macrocytic anemia Hemoglobin 10.6 with MCV 106.2 on admission.  Anemia panel with iron 15, TIBC 176, ferritin 322, B12 1404, folate 32.3.  Etiology likely secondary to anemia of chronic medical disease.  Nursing reported dark stools overnight, had subcutaneous heparin started by cardiology has been discontinued. --Hgb 10.0>8.5 --Discontinued subcutaneous heparin --Protonix 40 mg IV every 12 hours --Transfuse 1 unit PRBC today --Will hold on GI consult given the very poor candidate for invasive procedure at this point  Elevated LFTs: Improving Etiology likely multifactorial, related to sepsis, congestive hepatopathy.  Also with elevated alkaline phosphatase, likely related to osseous lesions from suspected prostate cancer.  EtOH level less than 10.  Right upper quadrant ultrasound with no  focal liver lesion identified, parenchymal echogenicity density within normal limits, portal vein patent.  Alcohol cessation.  Acute hepatitis panel negative. --AST 71>56>45>52>33>52>45 --ALT 157>128>102>95>67>69>56 --Avoid hepatotoxins --CMP daily  Hypokalemia Potassium 3.8, magnesium 2.0.  Will replete potassium today --Repeat electrolytes in a.m.  Alcohol use disorder Patient reports drinking 1-2 beers daily, denies any use within the last week.  Counseled on need for cessation --CIWA protocol  DVT prophylaxis:    Code Status: DNR Family Communication: Updated daughter present at bedside this morning Disposition Plan:  Level ofcare: Telemetry Status is: Inpatient  Remains inpatient appropriate because: Continues on IV antibiotics for sepsis related to severe cellulitis right lower extremity, on IV diuresis for new diagnosis of systolic congestive heart failure,.  Awaiting palliative care consultation given overall extremely poor prognosis with less than 6 months of life remaining.     Consultants:  Urology Cardiology -signed off 09/30/19/2022 Palliative care  Procedures:  Foley catheter placement 12/7 TTE  Antimicrobials:  Vancomycin 12/7 - 12/9 Cefepime 12/7 - 12/9 Ceftriaxone 12/9>>   Subjective: Patient seen examined at bedside, lying in bed.  No specific complaints this morning.  Nursing reported dark stool overnight with drop in hemoglobin from 10.0-8.5 this morning.  Discontinued subcutaneous heparin and starting IV Protonix.  Transfusing 1 unit PRBC.  Patient's is asymptomatic.  Denies headache, no dizziness, no chest pain, no shortness of breath, no abdominal pain, no weakness, no fatigue, no paresthesias.  Updated patient's daughter present at bedside.  Overall remains very poor prognosis awaiting palliative care consult.  No other acute events/concerns overnight per nursing staff.   Objective: Vitals:   09/30/21 2054 10/01/21 0350 10/01/21 0357 10/01/21 0824   BP: (!) 110/57 (!) 101/53  107/64  Pulse: (!) 111 66  88  Resp: (!) 22 20    Temp: 98.3 F (36.8 C) 98.3 F (36.8 C)    TempSrc: Oral Oral    SpO2: 99% 100%    Weight:  76.3 kg   Height:        Intake/Output Summary (Last 24 hours) at 10/01/2021 1224 Last data filed at 10/01/2021 6378 Gross per 24 hour  Intake 120 ml  Output 3150 ml  Net -3030 ml   Filed Weights   09/30/21 0000 09/30/21 0500 10/01/21 0357  Weight: 76 kg 75.6 kg 76.3 kg    Examination:  General exam: Appears calm and comfortable, appears older than stated age, chronically ill in appearance Respiratory system: Breath sounds slightly decreased bilateral bases, mild crackles, no wheezing, normal respiratory effort without accessory muscle use, on room air with SPO2 100% at rest Cardiovascular system: S1 & S2 heard, tachycardic, irregularly irregular rhythm. No JVD, murmurs, rubs, gallops or clicks.  Gastrointestinal system: Abdomen is nondistended, soft and nontender. No organomegaly or masses felt. Normal bowel sounds heard. Central nervous system: . No focal neurological deficits. Extremities: Moves all extremities independently, 3+ pitting edema bilateral lower extremities up to knee with erythema to right lower extremity; currently right lower extremity with dressing in place, clean/dry/intact Skin: Right lower extremity with erythema from right foot extending proximally about two thirds up to the knee with large intact bullae dorsal aspect of base of first toe with significant bilateral edema Psychiatry: Judgement and insight appear poor. Mood & affect appropriate.          Data Reviewed: I have personally reviewed following labs and imaging studies  CBC: Recent Labs  Lab 09/25/21 1835 09/27/21 0536 09/28/21 0522 09/29/21 0518 09/30/21 0449 10/01/21 0508  WBC 22.6* 17.2* 14.3* 13.5* 13.7* 17.2*  NEUTROABS 20.3*  --   --   --   --   --   HGB 10.6* 9.8* 10.1* 9.5* 10.0* 8.5*  HCT 32.7* 29.3*  30.2* 28.2* 30.4* 24.9*  MCV 106.2* 100.7* 101.3* 101.4* 103.4* 100.8*  PLT 180 132* 142* 129* 136* 588*   Basic Metabolic Panel: Recent Labs  Lab 09/26/21 0605 09/27/21 0536 09/28/21 0522 09/29/21 0518 09/30/21 0449 10/01/21 0508  NA 134* 134* 132* 134* 132* 134*  K 4.4 3.5 3.4* 2.8* 2.9* 3.8  CL 103 103 98 98 97* 102  CO2 15* 17* 17* 18* 17* 18*  GLUCOSE 108* 115* 134* 131* 124* 135*  BUN 128* 245* 231* 239* 128* 150*  CREATININE 4.93* 4.79* 4.55* 4.46* 4.46* 4.19*  CALCIUM 8.2* 7.6* 7.3* 7.1* 7.2* 7.1*  MG 2.2 2.0  --  2.0 1.7 2.0   GFR: Estimated Creatinine Clearance: 12 mL/min (A) (by C-G formula based on SCr of 4.19 mg/dL (H)). Liver Function Tests: Recent Labs  Lab 09/27/21 0536 09/28/21 0522 09/29/21 0518 09/30/21 0449 10/01/21 0508  AST 45* 52* 33 52* 45*  ALT 102* 95* 67* 69* 56*  ALKPHOS 140* 151* 126 146* 124  BILITOT 0.8 0.6 0.3 0.6 0.4  PROT 5.7* 6.1* 6.0* 6.0* 5.3*  ALBUMIN 2.4* 2.4* 2.7* 2.6* 2.3*   No results for input(s): LIPASE, AMYLASE in the last 168 hours. No results for input(s): AMMONIA in the last 168 hours. Coagulation Profile: Recent Labs  Lab 09/25/21 1835 09/26/21 0605  INR 2.4* 2.1*   Cardiac Enzymes: No results for input(s): CKTOTAL, CKMB, CKMBINDEX, TROPONINI in the last 168 hours. BNP (last 3 results) No results for input(s): PROBNP in the last 8760 hours. HbA1C: No results for input(s): HGBA1C in the last 72 hours. CBG: Recent Labs  Lab 09/25/21 1815 09/26/21 2049 09/27/21 0726 09/27/21 1142  GLUCAP 126* 107* 111* 175*   Lipid Profile: No results for input(s):  CHOL, HDL, LDLCALC, TRIG, CHOLHDL, LDLDIRECT in the last 72 hours. Thyroid Function Tests: No results for input(s): TSH, T4TOTAL, FREET4, T3FREE, THYROIDAB in the last 72 hours.  Anemia Panel: No results for input(s): VITAMINB12, FOLATE, FERRITIN, TIBC, IRON, RETICCTPCT in the last 72 hours.  Sepsis Labs: Recent Labs  Lab 09/25/21 1835 09/26/21 0605  09/27/21 0536 09/28/21 0522  PROCALCITON  --  6.98 6.00 4.54  LATICACIDVEN 1.5  --   --   --     Recent Results (from the past 240 hour(s))  Culture, blood (routine x 2)     Status: None   Collection Time: 09/25/21  6:35 PM   Specimen: BLOOD  Result Value Ref Range Status   Specimen Description   Final    BLOOD SITE NOT SPECIFIED Performed at [facility] [address], [place], [place] [address]    Special Requests   Final    BOTTLES DRAWN AEROBIC AND ANAEROBIC Blood Culture adequate volume Performed at [facility] [address] [place], [place] [address]    Culture   Final    NO GROWTH 5 DAYS Performed at [facility], [place] [address] [place], [place] [address]    Report Status 09/30/2021 FINAL  Final  Culture, blood (routine x 2)     Status: None   Collection Time: 09/25/21  6:35 PM   Specimen: BLOOD  Result Value Ref Range Status   Specimen Description   Final    BLOOD BLOOD LEFT HAND Performed at [facility] [address] [place], [place] [address]    Special Requests   Final    BOTTLES DRAWN AEROBIC AND ANAEROBIC Blood Culture results may not be optimal due to an inadequate volume of blood received in culture bottles Performed at [facility] [address] [place], [place] [address]    Culture   Final    NO GROWTH 5 DAYS Performed at [facility], [place] [address], [place], [place] [address]    Report Status 09/30/2021 FINAL  Final  Resp Panel by RT-PCR (Flu A&B, Covid) Peripheral     Status: None   Collection Time: 09/25/21  6:35 PM   Specimen: Peripheral; Nasopharyngeal(NP) swabs in vial transport medium  Result Value Ref Range Status   SARS Coronavirus 2 by RT PCR NEGATIVE NEGATIVE Final    Comment: (NOTE) SARS-CoV-2 target nucleic acids are NOT DETECTED.  The SARS-CoV-2 RNA is generally detectable in upper respiratory specimens during the acute phase of  infection. The lowest concentration of SARS-CoV-2 viral copies this assay can detect is 138 copies/mL. A negative result does not preclude SARS-Cov-2 infection and should not be used as the sole basis for treatment or other patient management decisions. A negative result may occur with  improper specimen collection/handling, submission of specimen other than nasopharyngeal swab, presence of viral mutation(s) within the areas targeted by this assay, and inadequate number of viral copies(<138 copies/mL). A negative result must be combined with clinical observations, patient history, and epidemiological information. The expected result is Negative.  Fact Sheet for Patients:  EntrepreneurPulse.com.au  Fact Sheet for Healthcare Providers:  IncredibleEmployment.be  This test is no t yet approved or cleared by the [place] FDA and  has been authorized for detection and/or diagnosis of SARS-CoV-2 by FDA under an Emergency Use Authorization (EUA). This EUA will remain  in effect (meaning this test can be used) for the duration of the COVID-19 declaration under  Section 564(b)(1) of the Act, 21 U.S.C.section 360bbb-3(b)(1), unless the authorization is terminated  or revoked sooner.       Influenza A by PCR NEGATIVE NEGATIVE Final   Influenza B by PCR NEGATIVE NEGATIVE Final    Comment: (NOTE) The Xpert Xpress SARS-CoV-2/FLU/RSV plus assay is intended as an aid in the diagnosis of influenza from Nasopharyngeal swab specimens and should not be used as a sole basis for treatment. Nasal washings and aspirates are unacceptable for Xpert Xpress SARS-CoV-2/FLU/RSV testing.  Fact Sheet for Patients: EntrepreneurPulse.com.au  Fact Sheet for Healthcare Providers: IncredibleEmployment.be  This test is not yet approved or cleared by the [place] FDA and has been authorized for detection and/or diagnosis of SARS-CoV-2  by FDA under an Emergency Use Authorization (EUA). This EUA will remain in effect (meaning this test can be used) for the duration of the COVID-19 declaration under Section 564(b)(1) of the Act, 21 U.S.C. section 360bbb-3(b)(1), unless the authorization is terminated or revoked.  Performed at [facility], [place] [address], [place], [place] [address]   MRSA Next Gen by PCR, Nasal     Status: None   Collection Time: 09/26/21  2:22 PM   Specimen: Nasal Mucosa; Nasal Swab  Result Value Ref Range Status   MRSA by PCR Next Gen NOT DETECTED NOT DETECTED Final    Comment: (NOTE) The GeneXpert MRSA Assay (FDA approved for NASAL specimens only), is one component of a comprehensive MRSA colonization surveillance program. It is not intended to diagnose MRSA infection nor to guide or monitor treatment for MRSA infections. Test performance is not FDA approved in patients less than 73 years old. Performed at [facility], [place] [address] [place], [place] [address]          Radiology Studies: No results found.      Scheduled Meds:  (feeding supplement) PROSource Plus  30 mL Oral BID BM   sodium chloride   Intravenous Once   amiodarone  200 mg Oral Daily   Chlorhexidine Gluconate Cloth  6 each Topical Daily   feeding supplement (NEPRO CARB STEADY)  237 mL Oral Q24H   furosemide  40 mg Intravenous Q12H   metoprolol tartrate  25 mg Oral BID   midodrine  5 mg Oral TID WC   multivitamin  1 tablet Oral QHS   pantoprazole (PROTONIX) IV  40 mg Intravenous Q12H   sodium chloride flush  3 mL Intravenous Q12H   Continuous Infusions:  cefTRIAXone (ROCEPHIN)  IV 2 g (10/01/21 0836)     LOS: 6 days    Time spent: 41 minutes spent on chart review, discussion with nursing staff, consultants, updating family and interview/physical exam; more than 50% of that time was spent in counseling and/or coordination of care.    Aesha Agrawal J [place] DO [facility] Available via Epic secure chat 7am-7pm After these hours, please refer to coverage provider listed on [email or web address] 10/01/2021, 12:24 PM

## 2021-10-02 LAB — HEMOGLOBIN AND HEMATOCRIT, BLOOD
HCT: 23.1 % — ABNORMAL LOW (ref 39.0–52.0)
Hemoglobin: 7.9 g/dL — ABNORMAL LOW (ref 13.0–17.0)

## 2021-10-02 LAB — COMPREHENSIVE METABOLIC PANEL
ALT: 47 U/L — ABNORMAL HIGH (ref 0–44)
AST: 34 U/L (ref 15–41)
Albumin: 2.1 g/dL — ABNORMAL LOW (ref 3.5–5.0)
Alkaline Phosphatase: 110 U/L (ref 38–126)
Anion gap: 17 — ABNORMAL HIGH (ref 5–15)
BUN: 150 mg/dL — ABNORMAL HIGH (ref 8–23)
CO2: 17 mmol/L — ABNORMAL LOW (ref 22–32)
Calcium: 7.5 mg/dL — ABNORMAL LOW (ref 8.9–10.3)
Chloride: 98 mmol/L (ref 98–111)
Creatinine, Ser: 4.09 mg/dL — ABNORMAL HIGH (ref 0.61–1.24)
GFR, Estimated: 13 mL/min — ABNORMAL LOW (ref >60)
Glucose, Bld: 131 mg/dL — ABNORMAL HIGH (ref 70–99)
Potassium: 3.2 mmol/L — ABNORMAL LOW (ref 3.5–5.1)
Sodium: 132 mmol/L — ABNORMAL LOW (ref 135–145)
Total Bilirubin: 0.5 mg/dL (ref 0.3–1.2)
Total Protein: 5 g/dL — ABNORMAL LOW (ref 6.5–8.1)

## 2021-10-02 LAB — CBC
HCT: 22.1 % — ABNORMAL LOW (ref 39.0–52.0)
Hemoglobin: 7.4 g/dL — ABNORMAL LOW (ref 13.0–17.0)
MCH: 30.1 pg (ref 26.0–34.0)
MCHC: 33.5 g/dL (ref 30.0–36.0)
MCV: 89.8 fL (ref 80.0–100.0)
Platelets: 157 10*3/uL (ref 150–400)
RBC: 2.46 MIL/uL — ABNORMAL LOW (ref 4.22–5.81)
WBC: 14.5 10*3/uL — ABNORMAL HIGH (ref 4.0–10.5)
nRBC: 0.4 % — ABNORMAL HIGH (ref 0.0–0.2)

## 2021-10-02 LAB — PREPARE RBC (CROSSMATCH)

## 2021-10-02 LAB — MAGNESIUM: Magnesium: 1.7 mg/dL (ref 1.7–2.4)

## 2021-10-02 MED ORDER — SODIUM CHLORIDE 0.9% IV SOLUTION: Freq: Once | INTRAVENOUS | Status: AC

## 2021-10-02 NOTE — TOC Progression Note (Addendum)
Transition of Care [facility]) - Progression Note    Patient Details  Name: Daniel Andersen MRN: [id number] Date of Birth: [birth year]  Transition of [facility]) CM/SW Contact  Micaiah Remillard, Juliann Pulse, RN Phone Number: 10/02/2021, 10:21 AM  Clinical Narrative: Bed offers given to dtr [place]-await choice.    1. 1.3 mi [facility] [place], [place] [address] [phone] Overall rating Above average 2. 1.6 mi [place] [facility] [place], [place] [address] [phone] Overall rating Much below average 3. 2.1 mi [facility] [place], [place] [address] [phone] Overall rating Much below average 4. 2.5 mi [facility] [place], [place] [address] [phone] Overall rating Below average 5. 2.8 mi [facility] at the [place], [place] [address] [phone] Overall rating Average 6. 2.8 mi [facility] [address] [place], [place] [address] [phone] Overall rating Much below average 7. 3 mi [facility] [place], [place] [address] [phone] Overall rating Above average 8. 3.6 [place] [address] [place], [place] [address] [phone] Overall rating Average 9. 3.6 mi [facility] [address] [place], [place] [address] [phone] Overall rating Much below average 10. 3.9 mi [facility] [place], [place] [address] [phone] Overall rating Much below average 11. 4.4 mi [facility] [place], [place] [address] [phone] Overall rating Much above average 12. 4.6 mi [facility] [place], [place] [address] [phone] Overall rating Much above average 13. 5.5 mi [facility] [address] [place], [place] [address] [phone] Overall rating Above average 14. 8.2 [facility] [place], [place] [address] [phone] Overall rating Much above average 15. 9 mi [facility] [address] [place], [place] [address] [phone] Overall rating Below average 16. 9.1 [place] [facility] [place], [place] [address] [phone] Overall rating Much below average 17. 9.2 mi [facility] [address] [place], [place] [address] [phone] Overall rating Much above average 18. 10.8 mi [place] [facility] [address] [place], [place] [address] [phone] Overall rating Much above average 19. 12.6 mi [facility] [address] [place], [place] [address] [phone] Overall rating Much below average 20. 12.8 [facility] [place], [place] [address] [phone] Overall rating Much below average 21. 14.2 mi [facility] [address] [place], [place] [address] [phone] Overall rating Below average 22. 14.4 mi [facility] [place], [place] [address] [phone] Overall rating Above average 23. 14.8 mi [facility] [place], [place] [address] [phone] Overall rating Much above average 24. 14.9 [place] [address] [place], [place] [address] [phone] Overall rating Much below average 25. 16.5 mi [facility] [address] [place], [place] [address] [phone] Overall rating Average 26. 16.7 mi [facility] [address] [place], [place] [address] [phone] Overall rating Above average 27. 17.9 mi [facility]  [place], [place] [address] [phone] Overall rating Average 28. 40.0 [facility] [address] [place], [place] [address] [phone] Overall rating Much below average 29. 19.7 mi [place] [address] [place], [place] [address] [phone] Overall rating Much below average 30. 20 [address] at the [facility], [place] [address] [phone] Overall rating Much above average 31. 21.1 mi [facility] [place], [place] [address] [phone] Overall rating Much below average 32. 21.6 [address] [place], [place] [address] [phone] Overall rating Below average 33. 41.7 [facility] [address] [place], [place] [address] [phone] Overall rating Below average 34. 21.8 [place], [place] [address] [phone] Overall rating Above average 35. [address] [place], [place] [address] [phone] Overall rating Much above average 36. 22.6 mi [facility] [address] [place], [place] [address] [phone] Overall rating Average 37. 22.7 mi [facility] [place], [place] [address] [phone] Overall rating Much below average 38. 23.3 mi [facility] [place] [address] [place], [place] [address] [phone] Overall rating Above average 39. 23.5 [place], [place] [address] [phone] Overall rating Not available18 40. 24.1 mi [facility] [address] [place], [place] [address] [phone] Overall rating Much below average 41. 24.2 mi [place] [address] [place], [place] [address] [phone] Overall rating Below average 42. 24.4 [facility] [address] [place], [place] [address] [phone] Overall rating Much below average 43. 24.5 mi [facility] [place], [place] [address] [phone] Overall rating Above average To explore and download nursing home data,visit th  Expected Discharge Plan: [place] Barriers to Discharge: Continued Medical Work up  Expected Discharge Plan and Services Expected Discharge Plan: [place]   Discharge Planning Services: CM Consult   Living arrangements for the past 2 months: Single Family Home                                       Social Determinants of Health (SDOH) Interventions    Readmission Risk Interventions No flowsheet data found.

## 2021-10-02 NOTE — Progress Notes (Signed)
Physical Therapy Treatment Patient Details Name: Daniel Andersen MRN: [id number] DOB: 25-Mar-1932 Today's Date: 10/02/2021   History of Present Illness Daniel Andersen is a 87 y.o. male presented to the ED for evaluation of right lower extremity infection--found to have sepsis due to RLE cellulitis and acute renal failure.  PHMx: hypertension and alcohol use    PT Comments    With encouragement, pt agreed to participate with mobility. Decreased assistance needed for bed mobility today. Min A to roll and then Min A for sidelying to sit with head elevated. Copious amount of bleeding noted from rectum while sitting at edge of bed, so returned to supine and assisted with linen change and clean up.     Recommendations for follow up therapy are one component of a multi-disciplinary discharge planning process, led by the attending physician.  Recommendations may be updated based on patient status, additional functional criteria and insurance authorization.  Follow Up Recommendations  Skilled nursing-short term rehab (<3 hours/day)     Assistance Recommended at Discharge Frequent or constant Supervision/Assistance  Equipment Recommendations  None recommended by PT    Recommendations for Other Services       Precautions / Restrictions Precautions Precautions: Fall Precaution Comments: pt denies h/o falls in past 6 months Restrictions Weight Bearing Restrictions: No     Mobility  Bed Mobility Overal bed mobility: Needs Assistance Bed Mobility: Rolling;Sidelying to Sit Rolling: Min assist Sidelying to sit: Min assist;HOB elevated       General bed mobility comments: min A to initiate roll and to raise trunk; pt had copious amount of rectal bleeding while sitting at edge of bed so returned to supine and assisted with clean up. RN notified.    Transfers                   General transfer comment: deferred 2* active GIB with bed mobility    Ambulation/Gait                    Stairs             Wheelchair Mobility    Modified Rankin (Stroke Patients Only)       Balance Overall balance assessment: Needs assistance Sitting-balance support: No upper extremity supported;Feet supported Sitting balance-Leahy Scale: Fair                                      Cognition Arousal/Alertness: Awake/alert Behavior During Therapy: Anxious Overall Cognitive Status: No family/caregiver present to determine baseline cognitive functioning                                 General Comments: oriented to self, knows he's in a hospital but thought it was [facility], can follow 1 step commands        Exercises      General Comments        Pertinent Vitals/Pain Faces Pain Scale: Hurts little more Pain Location: RLE with movement Pain Descriptors / Indicators: Grimacing Pain Intervention(s): Limited activity within patient's tolerance;Monitored during session;Repositioned    Home Living                          Prior Function            PT Goals (current goals can now  be found in the care plan section) Acute Rehab PT Goals Patient Stated Goal: to get stronger PT Goal Formulation: With patient/family Time For Goal Achievement: 2021/10/19 Potential to Achieve Goals: Fair Progress towards PT goals: Progressing toward goals    Frequency    Min 2X/week      PT Plan Current plan remains appropriate    Co-evaluation              AM-PAC PT "6 Clicks" Mobility   Outcome Measure  Help needed turning from your back to your side while in a flat bed without using bedrails?: A Little Help needed moving from lying on your back to sitting on the side of a flat bed without using bedrails?: A Lot Help needed moving to and from a bed to a chair (including a wheelchair)?: Total Help needed standing up from a chair using your arms (e.g., wheelchair or bedside chair)?: Total Help needed to walk in hospital  room?: Total Help needed climbing 3-5 steps with a railing? : Total 6 Click Score: 9    End of Session   Activity Tolerance: Treatment limited secondary to medical complications (Comment) (active lower GIB) Patient left: in bed;with nursing/sitter in room;with call bell/phone within reach Nurse Communication: Mobility status;Other (comment) (copious amount of lower GIB with moblity) PT Visit Diagnosis: Pain;Other abnormalities of gait and mobility (R26.89);Muscle weakness (generalized) (M62.81);Difficulty in walking, not elsewhere classified (R26.2) Pain - Right/Left: Right Pain - part of body: Leg     Time: 9390-3009 PT Time Calculation (min) (ACUTE ONLY): 32 min  Charges:  $Therapeutic Activity: 23-37 mins                    Blondell Reveal Kistler PT 10/02/2021  Acute Rehabilitation Services Pager [phone] Office [phone]

## 2021-10-02 NOTE — Progress Notes (Signed)
PROGRESS NOTE    LERONE ONDER  EGB:[id number] DOB: 19-Sep-1932 DOA: 09/25/2021 PCP: Carlena Hurl, PA-C    Brief Narrative:   Daniel Andersen is a 87 year old male with past medical history of hypertension, alcohol abuse presented to the hospital on 09/25/2021 with altered mental status and worsening lower extremity wounds since Thanksgiving.  In the ED, initial vitals were stable.  Lab showed  BUN 115, creatinine 5.18, alkaline phosphatase 199, AST 71, ALT 157, total bilirubin 1.3.  BNP 3000 and urine 27.9.  Lactic acid 1.5.  WBC 22.6.  Hemoglobin 10.6, platelet count 180.  COVID-19 PCR negative.  Influenza A/B PCR negative.  EtOH level less than 10.  Chest x-ray showed cardiomegaly, vascular congestion, pulmonary edema and bilateral effusions.  Right foot and tibia/fibula x-ray with no acute osseous abnormality.  Renal ultrasound with significant hydronephrosis bilaterally extending to the level of the urinary bladder suggestive of bladder outlet obstruction.  CT chest without contrast showed changes consistent with bony metastatic disease likely related to prostate carcinoma, bilateral pleural effusions with lower lobe atelectasis with no focal mass lesion noted.  Patient was started on IV vancomycin and cefepime and received 500 cc normal saline.  Hospital service was then consulted for further evaluation and management.   Assessment & Plan:   Principal Problem:   Sepsis due to cellulitis [facility]) Active Problems:   Essential hypertension   Acute renal failure (ARF) (HCC)   Macrocytic anemia   Pleural effusion, bilateral  Sepsis, secondary to right lower extremity cellulitis Patient was initially on vancomycin and cefepime which has been transitioned to Rocephin at this time.  Blood cultures negative so far.  Continue wound dressing.  Plan is 10 days total of antibiotics.  Leukocytosis has trended down  Acute renal failure with metabolic acidosis secondary to Obstructive  uropathy Initial creatinine on presentation was  5.18.  Renal ultrasound showed significant bilateral hydronephrosis with bladder outlet obstruction.  Foley catheter was placed in.  Urology was consulted but due to poor prognosis no intervention was advised.  Creatinine has been trending down.  Continue Foley catheter.  Intake and output charting.  Creatinine today at the 4.0.  Acute systolic congestive heart failure, new diagnosis  Patient had large bilateral pleural effusions, significant peripheral edema and elevated BNP at  3827. 2D echocardiogram showed  LVEF 25-30%, LV severely decreased function with global hypokinesis, mild concentric LVH, LV diastolic parameters indeterminate, LA mildly dilated, RV systolic function severely reduced, moderate pleural effusion left lateral region, trivial MR, IVC dilated.  Cardiology initially consulted now signed off as of 09/29/2021.  Continue metoprolol, Lasix, fluid restriction, strict intake and output charting.  On Lasix 40 IV daily.  We will continue for now.  Atrial fibrillation with RVR.   Heart rate still accelerated.  TSH: 1.541, continue amiodarone and metoprolol.  Osseous metastatic lesions concerning for prostate cancer as primary Several osseous metastasis.  Urology following.  Has been started on androgen deprivation therapy since at 09/26/2021.  Palliative care on board for goals of care, discussing undergoing.  Macrocytic anemia with ongoing GI bleed. Heparin has been discontinued.  Continue Protonix 40 mg twice daily.  Received 1 unit of packed RBC yesterday.  Will transfuse 1 more unit today.  Patient has very poor prognosis, so GI intervention has not been considered so far.    Elevated LFTs: Improving Thought to be multifactorial from sepsis, congestive hepatomegaly.    Hypokalemia Potassium of 3.2 today.  We will replenish.  Check BMP  in AM.  Thrombocytopenia.  Has improved, recheck CBC in AM.  Alcohol use disorder Patient had  reported drinking 1-2 beers daily.  Was initiated on CIWA protocol  Ethics/goals of care.  Patient's prognosis appears to be poor at this time.  Palliative care on board.  Patient's family still wishing for skilled nursing facility.  DVT prophylaxis:   None due to GI bleed     Code Status: DNR  Family Communication:  I tried to reach the patient's daughter on the phone but was unable to reach her today.  Disposition Plan: Unknown at this time.  Possible skilled nursing facility if patient improves if not will benefit from hospice care.  Status is: Inpatient  Remains inpatient appropriate because:  IV diuresis, PRBC transfusion, goals of care discussion    Consultants:  Urology Cardiology -signed off 09/30/19/2022 Palliative care  Procedures:  Foley catheter placement 09/25/21 RBC transfusion  Antimicrobials:  Ceftriaxone 12/9>>   Subjective: Today, patient was seen and examined at bedside.  Nursing staff reported that the patient was tachycardic and had bright red blood in the stool.  Hemoglobin was down to 7.4.   Objective: Vitals:   10/01/21 2008 10/01/21 2201 10/02/21 0500 10/02/21 0515  BP: (!) 107/51 (!) 107/57  (!) 109/48  Pulse: 93 (!) 104  97  Resp: (!) 21   20  Temp: 97.8 F (36.6 C)   98.7 F (37.1 C)  TempSrc: Oral   Oral  SpO2: 100%   100%  Weight:   75.3 kg   Height:        Intake/Output Summary (Last 24 hours) at 10/02/2021 0811 Last data filed at 10/02/2021 0526 Gross per 24 hour  Intake 928.5 ml  Output 1550 ml  Net -621.5 ml    Filed Weights   09/30/21 0500 10/01/21 0357 10/02/21 0500  Weight: 75.6 kg 76.3 kg 75.3 kg    Physical examination: General:  not in obvious distress chronically ill, appears older than stated age, HENT:   Pallor noted, oral mucosa is moist.  Chest: Diminished breath sounds bilaterally. No crackles or wheezes.  CVS: S1 &S2 heard. No murmur.  Irregularly irregular rhythm, tachycardic Abdomen: Soft, nontender,  nondistended.  Bowel sounds are heard.   Extremities: No cyanosis, clubbing with bilateral lower extremity pitting edema up to the knee, right lower extremity dressing.  See picture below. Psych: Alert, awake and communicative. CNS:  No cranial nerve deficits.  Power equal in all extremities.   Skin: Right lower extremity erythema edema on presentation.       Data Reviewed: I have personally reviewed the following labs and imaging studies   CBC: Recent Labs  Lab 09/25/21 1835 09/27/21 0536 09/28/21 0522 09/29/21 0518 09/30/21 0449 10/01/21 0508 10/01/21 1725 10/02/21 0444  WBC 22.6*   < > 14.3* 13.5* 13.7* 17.2*  --  14.5*  NEUTROABS 20.3*  --   --   --   --   --   --   --   HGB 10.6*   < > 10.1* 9.5* 10.0* 8.5* 8.4* 7.4*  HCT 32.7*   < > 30.2* 28.2* 30.4* 24.9* 25.2* 22.1*  MCV 106.2*   < > 101.3* 101.4* 103.4* 100.8*  --  89.8  PLT 180   < > 142* 129* 136* 140*  --  157   < > = values in this interval not displayed.    Basic Metabolic Panel: Recent Labs  Lab 09/27/21 0536 09/28/21 0522 09/29/21 0518 09/30/21 0449 10/01/21 5916  10/02/21 0444  NA 134* 132* 134* 132* 134* 132*  K 3.5 3.4* 2.8* 2.9* 3.8 3.2*  CL 103 98 98 97* 102 98  CO2 17* 17* 18* 17* 18* 17*  GLUCOSE 115* 134* 131* 124* 135* 131*  BUN 245* 231* 239* 128* 150* 150*  CREATININE 4.79* 4.55* 4.46* 4.46* 4.19* 4.09*  CALCIUM 7.6* 7.3* 7.1* 7.2* 7.1* 7.5*  MG 2.0  --  2.0 1.7 2.0 1.7    GFR: Estimated Creatinine Clearance: 12.3 mL/min (A) (by C-G formula based on SCr of 4.09 mg/dL (H)). Liver Function Tests: Recent Labs  Lab 09/28/21 0522 09/29/21 0518 09/30/21 0449 10/01/21 0508 10/02/21 0444  AST 52* 33 52* 45* 34  ALT 95* 67* 69* 56* 47*  ALKPHOS 151* 126 146* 124 110  BILITOT 0.6 0.3 0.6 0.4 0.5  PROT 6.1* 6.0* 6.0* 5.3* 5.0*  ALBUMIN 2.4* 2.7* 2.6* 2.3* 2.1*    No results for input(s): LIPASE, AMYLASE in the last 168 hours. No results for input(s): AMMONIA in the last 168  hours. Coagulation Profile: Recent Labs  Lab 09/25/21 1835 09/26/21 0605  INR 2.4* 2.1*    Cardiac Enzymes: No results for input(s): CKTOTAL, CKMB, CKMBINDEX, TROPONINI in the last 168 hours. BNP (last 3 results) No results for input(s): PROBNP in the last 8760 hours. HbA1C: No results for input(s): HGBA1C in the last 72 hours. CBG: Recent Labs  Lab 09/25/21 1815 09/26/21 2049 09/27/21 0726 09/27/21 1142  GLUCAP 126* 107* 111* 175*    Lipid Profile: No results for input(s): CHOL, HDL, LDLCALC, TRIG, CHOLHDL, LDLDIRECT in the last 72 hours. Thyroid Function Tests: No results for input(s): TSH, T4TOTAL, FREET4, T3FREE, THYROIDAB in the last 72 hours.  Anemia Panel: No results for input(s): VITAMINB12, FOLATE, FERRITIN, TIBC, IRON, RETICCTPCT in the last 72 hours.  Sepsis Labs: Recent Labs  Lab 09/25/21 1835 09/26/21 0605 09/27/21 0536 09/28/21 0522  PROCALCITON  --  6.98 6.00 4.54  LATICACIDVEN 1.5  --   --   --      Recent Results (from the past 240 hour(s))  Culture, blood (routine x 2)     Status: None   Collection Time: 09/25/21  6:35 PM   Specimen: BLOOD  Result Value Ref Range Status   Specimen Description   Final    BLOOD SITE NOT SPECIFIED Performed at [facility] [address], [place], [place] [address]    Special Requests   Final    BOTTLES DRAWN AEROBIC AND ANAEROBIC Blood Culture adequate volume Performed at [facility] [address], [place], [place] [address]    Culture   Final    NO GROWTH 5 DAYS Performed at [facility], [place] [address] [place], [place] [address]    Report Status 09/30/2021 FINAL  Final  Culture, blood (routine x 2)     Status: None   Collection Time: 09/25/21  6:35 PM   Specimen: BLOOD  Result Value Ref Range Status   Specimen Description   Final    BLOOD BLOOD LEFT HAND Performed at [facility] [address], [place], [place] [address]     Special Requests   Final    BOTTLES DRAWN AEROBIC AND ANAEROBIC Blood Culture results may not be optimal due to an inadequate volume of blood received in culture bottles Performed at [facility] [address], [place], [place] [address]    Culture   Final    NO GROWTH  5 DAYS Performed at [facility], [place] [address], [place], [place] [address]    Report Status 09/30/2021 FINAL  Final  Resp Panel by RT-PCR (Flu A&B, Covid) Peripheral     Status: None   Collection Time: 09/25/21  6:35 PM   Specimen: Peripheral; Nasopharyngeal(NP) swabs in vial transport medium  Result Value Ref Range Status   SARS Coronavirus 2 by RT PCR NEGATIVE NEGATIVE Final    Comment: (NOTE) SARS-CoV-2 target nucleic acids are NOT DETECTED.  The SARS-CoV-2 RNA is generally detectable in upper respiratory specimens during the acute phase of infection. The lowest concentration of SARS-CoV-2 viral copies this assay can detect is 138 copies/mL. A negative result does not preclude SARS-Cov-2 infection and should not be used as the sole basis for treatment or other patient management decisions. A negative result may occur with  improper specimen collection/handling, submission of specimen other than nasopharyngeal swab, presence of viral mutation(s) within the areas targeted by this assay, and inadequate number of viral copies(<138 copies/mL). A negative result must be combined with clinical observations, patient history, and epidemiological information. The expected result is Negative.  Fact Sheet for Patients:  EntrepreneurPulse.com.au  Fact Sheet for Healthcare Providers:  IncredibleEmployment.be  This test is no t yet approved or cleared by the [place] FDA and  has been authorized for detection and/or diagnosis of SARS-CoV-2 by FDA under an Emergency Use Authorization (EUA). This EUA will remain  in effect (meaning this test can be used) for  the duration of the COVID-19 declaration under Section 564(b)(1) of the Act, 21 U.S.C.section 360bbb-3(b)(1), unless the authorization is terminated  or revoked sooner.       Influenza A by PCR NEGATIVE NEGATIVE Final   Influenza B by PCR NEGATIVE NEGATIVE Final    Comment: (NOTE) The Xpert Xpress SARS-CoV-2/FLU/RSV plus assay is intended as an aid in the diagnosis of influenza from Nasopharyngeal swab specimens and should not be used as a sole basis for treatment. Nasal washings and aspirates are unacceptable for Xpert Xpress SARS-CoV-2/FLU/RSV testing.  Fact Sheet for Patients: EntrepreneurPulse.com.au  Fact Sheet for Healthcare Providers: IncredibleEmployment.be  This test is not yet approved or cleared by the [place] FDA and has been authorized for detection and/or diagnosis of SARS-CoV-2 by FDA under an Emergency Use Authorization (EUA). This EUA will remain in effect (meaning this test can be used) for the duration of the COVID-19 declaration under Section 564(b)(1) of the Act, 21 U.S.C. section 360bbb-3(b)(1), unless the authorization is terminated or revoked.  Performed at [facility], [place] [address], [place], [place] [address]   MRSA Next Gen by PCR, Nasal     Status: None   Collection Time: 09/26/21  2:22 PM   Specimen: Nasal Mucosa; Nasal Swab  Result Value Ref Range Status   MRSA by PCR Next Gen NOT DETECTED NOT DETECTED Final    Comment: (NOTE) The GeneXpert MRSA Assay (FDA approved for NASAL specimens only), is one component of a comprehensive MRSA colonization surveillance program. It is not intended to diagnose MRSA infection nor to guide or monitor treatment for MRSA infections. Test performance is not FDA approved in patients less than 64 years old. Performed at [facility], [place] [address] [place], [place] [address]       Radiology Studies: No results  found.  Scheduled Meds:  (feeding supplement) PROSource Plus  30 mL Oral BID BM   sodium chloride   Intravenous Once   amiodarone  200 mg Oral Daily   Chlorhexidine Gluconate Cloth  6 each Topical Daily   feeding supplement (NEPRO CARB STEADY)  237 mL Oral Q24H   furosemide  40 mg Intravenous Q12H   metoprolol tartrate  25 mg Oral BID   midodrine  5 mg Oral TID WC   multivitamin  1 tablet Oral QHS   pantoprazole (PROTONIX) IV  40 mg Intravenous Q12H   sodium chloride flush  3 mL Intravenous Q12H   Continuous Infusions:  cefTRIAXone (ROCEPHIN)  IV Stopped (10/01/21 0909)     LOS: 7 days    Flora Lipps, MD [facility] 10/02/2021, 8:11 AM

## 2021-10-03 LAB — BPAM RBC
Blood Product Expiration Date: 202301102359
Blood Product Expiration Date: 202301112359
ISSUE DATE / TIME: 202212131330
ISSUE DATE / TIME: 202212140854
Unit Type and Rh: 5100
Unit Type and Rh: 5100

## 2021-10-03 LAB — TYPE AND SCREEN
ABO/RH(D): O POS
Antibody Screen: NEGATIVE
Unit division: 0
Unit division: 0

## 2021-10-03 LAB — CBC
HCT: 23.6 % — ABNORMAL LOW (ref 39.0–52.0)
Hemoglobin: 8.1 g/dL — ABNORMAL LOW (ref 13.0–17.0)
MCH: 30 pg (ref 26.0–34.0)
MCHC: 34.3 g/dL (ref 30.0–36.0)
MCV: 87.4 fL (ref 80.0–100.0)
Platelets: 156 10*3/uL (ref 150–400)
RBC: 2.7 MIL/uL — ABNORMAL LOW (ref 4.22–5.81)
RDW: 24.5 % — ABNORMAL HIGH (ref 11.5–15.5)
WBC: 16.1 10*3/uL — ABNORMAL HIGH (ref 4.0–10.5)
nRBC: 0.3 % — ABNORMAL HIGH (ref 0.0–0.2)

## 2021-10-03 LAB — BASIC METABOLIC PANEL
Anion gap: 15 (ref 5–15)
BUN: 148 mg/dL — ABNORMAL HIGH (ref 8–23)
CO2: 21 mmol/L — ABNORMAL LOW (ref 22–32)
Calcium: 7.3 mg/dL — ABNORMAL LOW (ref 8.9–10.3)
Chloride: 95 mmol/L — ABNORMAL LOW (ref 98–111)
Creatinine, Ser: 4.15 mg/dL — ABNORMAL HIGH (ref 0.61–1.24)
GFR, Estimated: 13 mL/min — ABNORMAL LOW (ref >60)
Glucose, Bld: 158 mg/dL — ABNORMAL HIGH (ref 70–99)
Potassium: 2.8 mmol/L — ABNORMAL LOW (ref 3.5–5.1)
Sodium: 131 mmol/L — ABNORMAL LOW (ref 135–145)

## 2021-10-03 LAB — MAGNESIUM: Magnesium: 1.7 mg/dL (ref 1.7–2.4)

## 2021-10-03 MED ORDER — ORAL CARE MOUTH RINSE
15.0000 mL | Freq: Two times a day (BID) | OROMUCOSAL | Status: DC
  Administered 2021-10-03 – 2021-10-08 (×6): 15 mL via OROMUCOSAL

## 2021-10-03 MED ORDER — FUROSEMIDE 10 MG/ML IJ SOLN
40.0000 mg | Freq: Every day | INTRAMUSCULAR | Status: DC
  Administered 2021-10-04 – 2021-10-08 (×5): 40 mg via INTRAVENOUS
  Filled 2021-10-03 (×5): qty 4

## 2021-10-03 MED ORDER — POTASSIUM CHLORIDE CRYS ER 20 MEQ PO TBCR
40.0000 meq | EXTENDED_RELEASE_TABLET | Freq: Two times a day (BID) | ORAL | Status: AC
  Administered 2021-10-03 (×2): 40 meq via ORAL
  Filled 2021-10-03 (×2): qty 2

## 2021-10-03 NOTE — Progress Notes (Signed)
PROGRESS NOTE    ZAVEON GILLEN  XTG:[id number] DOB: 07/12/1932 DOA: 09/25/2021 PCP: Carlena Hurl, PA-C    Brief Narrative:   ALBERTA CAIRNS is a 87 year old male with past medical history of hypertension, alcohol abuse presented to the hospital on 09/25/2021 with altered mental status and worsening lower extremity wounds since Thanksgiving.  In the ED, initial vitals were stable.  Lab showed  BUN 115, creatinine 5.18, alkaline phosphatase 199, AST 71, ALT 157, total bilirubin 1.3.  BNP 3000 and urine 27.9.  Lactic acid 1.5.  WBC 22.6.  Hemoglobin 10.6, platelet count 180.  COVID-19 PCR negative.  Influenza A/B PCR negative.  EtOH level less than 10.  Chest x-ray showed cardiomegaly, vascular congestion, pulmonary edema and bilateral effusions.  Right foot and tibia/fibula x-ray with no acute osseous abnormality.  Renal ultrasound with significant hydronephrosis bilaterally extending to the level of the urinary bladder suggestive of bladder outlet obstruction.  CT chest without contrast showed changes consistent with bony metastatic disease likely related to prostate carcinoma, bilateral pleural effusions with lower lobe atelectasis with no focal mass lesion noted.  Patient was started on IV vancomycin and cefepime and received 500 cc normal saline.  Hospital service was then consulted for further evaluation and management.  Assessment & Plan:   Principal Problem:   Sepsis due to cellulitis [facility]) Active Problems:   Essential hypertension   Acute renal failure (ARF) (HCC)   Macrocytic anemia   Pleural effusion, bilateral  Sepsis, secondary to right lower extremity cellulitis On IV Rocephin at this time.  Blood cultures negative so far.  Continue wound dressing.  Plan is 10 days total of antibiotics.  Leukocytosis persists.    Acute renal failure with metabolic acidosis secondary to Obstructive uropathy Initial creatinine on presentation was  5.18.  Renal ultrasound showed significant  bilateral hydronephrosis with bladder outlet obstruction.   Urology was consulted but due to poor prognosis no intervention was advised.  Continue Foley catheter.  Intake and output charting.  Creatinine today at the 4.1.  Acute systolic congestive heart failure, new diagnosis  Patient had large bilateral pleural effusions, significant peripheral edema and elevated BNP at  3827. 2D echocardiogram showed  LVEF 25-30%, LV severely decreased function with global hypokinesis, mild concentric LVH, LV diastolic parameters indeterminate, LA mildly dilated, RV systolic function severely reduced, moderate pleural effusion left lateral region, trivial MR, IVC dilated.  Cardiology initially consulted now signed off as of 09/29/2021.  Continue metoprolol, Lasix, , strict intake and output charting.  On Lasix 40 IV BID for now.  Fluid restriction removed due to thirst.  Atrial fibrillation with RVR.   TSH: 1.541, continue amiodarone and metoprolol.  Heart rate is mildly elevated but acceptable.  Osseous metastatic lesions concerning for prostate cancer as primary Several osseous metastasis.   Palliative care on board for goals of care, discussing undergoing.  Anemia, intermittent GI bleed. Heparin has been discontinued.  Continue Protonix 40 mg twice daily.  Received 1 unit of packed RBC yesterday.  Hemoglobin of 8.1 today.  We will continue to monitor.  Patient has very poor prognosis, so GI intervention has not been considered so far.    Elevated LFTs: Improving Thought to be multifactorial from sepsis, congestive hepatomegaly.  Check CMP in a.m.  Hypokalemia Potassium of 2.8 today.  Continue to replenish orally.  Magnesium of 1.7.  Has underlying renal failure .  Decrease dose of IV diuretic.  Mild hyponatremia.   We will continue to monitor.  Thrombocytopenia.  Has improved, recheck CBC in AM.  Alcohol use disorder Patient had reported drinking 1-2 beers daily at home.  Was initially on on CIWA  protocol is being monitored.  Ethics/goals of care.  Patient's prognosis appears to be poor at this time.  Palliative care on board.  Patient's family still wishing for skilled nursing facility.  DVT prophylaxis: Place and maintain sequential compression device Start: 10/02/21 1519  None due to GI bleed     Code Status: DNR  Family Communication:   I again tried to reach the patient's daughter on the phone but was unable to reach her today.  Disposition Plan:  Possible skilled nursing facility if patient improves if not, will benefit from hospice care.  Status is: Inpatient  Remains inpatient appropriate because:  IV diuresis, IV antibiotics, goals of care discussion, severe electrolyte imbalance,    Consultants:  Urology Cardiology -signed off 09/30/19/2022 Palliative care  Procedures:  Foley catheter placement 09/25/21 PRBC transfusion  Antimicrobials:  Ceftriaxone 12/9>>  Subjective: Today, patient was seen and examined at bedside.  Patient had 1 large bloody loose bowel movement in the night.  Denies any nausea vomiting abdominal pain.  Has been eating some this morning.  Objective: Vitals:   10/02/21 2000 10/02/21 2129 10/03/21 0500 10/03/21 0522  BP: 103/82 (!) 115/55  (!) 106/53  Pulse: 100 (!) 105  (!) 103  Resp:  19  18  Temp:  97.6 F (36.4 C)  97.8 F (36.6 C)  TempSrc:  Axillary  Axillary  SpO2: 100% 100%  100%  Weight:   75 kg   Height:        Intake/Output Summary (Last 24 hours) at 10/03/2021 0734 Last data filed at 10/03/2021 0445 Gross per 24 hour  Intake 1078 ml  Output 2803 ml  Net -1725 ml    Filed Weights   10/01/21 0357 10/02/21 0500 10/03/21 0500  Weight: 76.3 kg 75.3 kg 75 kg    Physical examination:  General:  not in obvious distress chronically ill, appears older than stated age, alert awake and communicative. HENT:   Pallor noted, oral mucosa is moist.  Chest: Diminished breath sounds bilaterally.  No obvious wheezing  noted. CVS: S1 &S2 heard. No murmur.  Irregularly irregular rhythm, tachycardic Abdomen: Soft, nontender, nondistended.  Bowel sounds are heard.   Extremities: No cyanosis, bilateral lower extremity  edema noted.  Right lower extremity under dressing.   Psych: Alert, awake and communicative.  Oriented to self place and month. CNS:  No cranial nerve deficits.  Moving all extremities. Skin: Right lower extremity erythema edema on presentation.       Data Reviewed: I have personally reviewed the following labs and imaging studies   CBC: Recent Labs  Lab 09/29/21 0518 09/30/21 0449 10/01/21 0508 10/01/21 1725 10/02/21 0444 10/02/21 1344 10/03/21 0450  WBC 13.5* 13.7* 17.2*  --  14.5*  --  16.1*  HGB 9.5* 10.0* 8.5* 8.4* 7.4* 7.9* 8.1*  HCT 28.2* 30.4* 24.9* 25.2* 22.1* 23.1* 23.6*  MCV 101.4* 103.4* 100.8*  --  89.8  --  87.4  PLT 129* 136* 140*  --  157  --  888    Basic Metabolic Panel: Recent Labs  Lab 09/29/21 0518 09/30/21 0449 10/01/21 0508 10/02/21 0444 10/03/21 0450  NA 134* 132* 134* 132* 131*  K 2.8* 2.9* 3.8 3.2* 2.8*  CL 98 97* 102 98 95*  CO2 18* 17* 18* 17* 21*  GLUCOSE 131* 124* 135* 131* 158*  BUN 239* 128* 150* 150* 148*  CREATININE 4.46* 4.46* 4.19* 4.09* 4.15*  CALCIUM 7.1* 7.2* 7.1* 7.5* 7.3*  MG 2.0 1.7 2.0 1.7 1.7    GFR: Estimated Creatinine Clearance: 12.1 mL/min (A) (by C-G formula based on SCr of 4.15 mg/dL (H)). Liver Function Tests: Recent Labs  Lab 09/28/21 0522 09/29/21 0518 09/30/21 0449 10/01/21 0508 10/02/21 0444  AST 52* 33 52* 45* 34  ALT 95* 67* 69* 56* 47*  ALKPHOS 151* 126 146* 124 110  BILITOT 0.6 0.3 0.6 0.4 0.5  PROT 6.1* 6.0* 6.0* 5.3* 5.0*  ALBUMIN 2.4* 2.7* 2.6* 2.3* 2.1*    No results for input(s): LIPASE, AMYLASE in the last 168 hours. No results for input(s): AMMONIA in the last 168 hours. Coagulation Profile: No results for input(s): INR, PROTIME in the last 168 hours.  Cardiac Enzymes: No results  for input(s): CKTOTAL, CKMB, CKMBINDEX, TROPONINI in the last 168 hours. BNP (last 3 results) No results for input(s): PROBNP in the last 8760 hours. HbA1C: No results for input(s): HGBA1C in the last 72 hours. CBG: Recent Labs  Lab 09/26/21 2049 09/27/21 0726 09/27/21 1142  GLUCAP 107* 111* 175*    Lipid Profile: No results for input(s): CHOL, HDL, LDLCALC, TRIG, CHOLHDL, LDLDIRECT in the last 72 hours. Thyroid Function Tests: No results for input(s): TSH, T4TOTAL, FREET4, T3FREE, THYROIDAB in the last 72 hours.  Anemia Panel: No results for input(s): VITAMINB12, FOLATE, FERRITIN, TIBC, IRON, RETICCTPCT in the last 72 hours.  Sepsis Labs: Recent Labs  Lab 09/27/21 0536 09/28/21 0522  PROCALCITON 6.00 4.54     Recent Results (from the past 240 hour(s))  Culture, blood (routine x 2)     Status: None   Collection Time: 09/25/21  6:35 PM   Specimen: BLOOD  Result Value Ref Range Status   Specimen Description   Final    BLOOD SITE NOT SPECIFIED Performed at [place] [address], [place], [place] [address]    Special Requests   Final    BOTTLES DRAWN AEROBIC AND ANAEROBIC Blood Culture adequate volume Performed at [place] [address], [place], [place] [address]    Culture   Final    NO GROWTH 5 DAYS Performed at [facility], [place] [address], [place], [place] [address]    Report Status 09/30/2021 FINAL  Final  Culture, blood (routine x 2)     Status: None   Collection Time: 09/25/21  6:35 PM   Specimen: BLOOD  Result Value Ref Range Status   Specimen Description   Final    BLOOD BLOOD LEFT HAND Performed at [place] [address] [place], [place] [address]    Special Requests   Final    BOTTLES DRAWN AEROBIC AND ANAEROBIC Blood Culture results may not be optimal due to an inadequate volume of blood received in culture bottles Performed at [place]  [address], [place], [place] [address]    Culture   Final    NO GROWTH 5 DAYS Performed at [facility], [place] [address], [place], [place] [address]    Report Status 09/30/2021 FINAL  Final  Resp Panel by RT-PCR (Flu A&B, Covid) Peripheral     Status: None   Collection Time: 09/25/21  6:35 PM   Specimen: Peripheral; Nasopharyngeal(NP) swabs in vial transport medium  Result Value Ref Range Status   SARS Coronavirus 2 by RT PCR NEGATIVE NEGATIVE Final  Comment: (NOTE) SARS-CoV-2 target nucleic acids are NOT DETECTED.  The SARS-CoV-2 RNA is generally detectable in upper respiratory specimens during the acute phase of infection. The lowest concentration of SARS-CoV-2 viral copies this assay can detect is 138 copies/mL. A negative result does not preclude SARS-Cov-2 infection and should not be used as the sole basis for treatment or other patient management decisions. A negative result may occur with  improper specimen collection/handling, submission of specimen other than nasopharyngeal swab, presence of viral mutation(s) within the areas targeted by this assay, and inadequate number of viral copies(<138 copies/mL). A negative result must be combined with clinical observations, patient history, and epidemiological information. The expected result is Negative.  Fact Sheet for Patients:  EntrepreneurPulse.com.au  Fact Sheet for Healthcare Providers:  IncredibleEmployment.be  This test is no t yet approved or cleared by the [place] FDA and  has been authorized for detection and/or diagnosis of SARS-CoV-2 by FDA under an Emergency Use Authorization (EUA). This EUA will remain  in effect (meaning this test can be used) for the duration of the COVID-19 declaration under Section 564(b)(1) of the Act, 21 U.S.C.section 360bbb-3(b)(1), unless the authorization is terminated  or revoked sooner.       Influenza A by PCR NEGATIVE NEGATIVE Final    Influenza B by PCR NEGATIVE NEGATIVE Final    Comment: (NOTE) The Xpert Xpress SARS-CoV-2/FLU/RSV plus assay is intended as an aid in the diagnosis of influenza from Nasopharyngeal swab specimens and should not be used as a sole basis for treatment. Nasal washings and aspirates are unacceptable for Xpert Xpress SARS-CoV-2/FLU/RSV testing.  Fact Sheet for Patients: EntrepreneurPulse.com.au  Fact Sheet for Healthcare Providers: IncredibleEmployment.be  This test is not yet approved or cleared by the [place] FDA and has been authorized for detection and/or diagnosis of SARS-CoV-2 by FDA under an Emergency Use Authorization (EUA). This EUA will remain in effect (meaning this test can be used) for the duration of the COVID-19 declaration under Section 564(b)(1) of the Act, 21 U.S.C. section 360bbb-3(b)(1), unless the authorization is terminated or revoked.  Performed at [facility], [place] [address], [place], [place] [address]   MRSA Next Gen by PCR, Nasal     Status: None   Collection Time: 09/26/21  2:22 PM   Specimen: Nasal Mucosa; Nasal Swab  Result Value Ref Range Status   MRSA by PCR Next Gen NOT DETECTED NOT DETECTED Final    Comment: (NOTE) The GeneXpert MRSA Assay (FDA approved for NASAL specimens only), is one component of a comprehensive MRSA colonization surveillance program. It is not intended to diagnose MRSA infection nor to guide or monitor treatment for MRSA infections. Test performance is not FDA approved in patients less than 70 years old. Performed at [facility], [place] [address] [place], [place] [address]       Radiology Studies: No results found.  Scheduled Meds:  (feeding supplement) PROSource Plus  30 mL Oral BID BM   amiodarone  200 mg Oral Daily   Chlorhexidine Gluconate Cloth  6 each Topical Daily   feeding supplement (NEPRO CARB STEADY)  237 mL Oral Q24H    furosemide  40 mg Intravenous Q12H   mouth rinse  15 mL Mouth Rinse BID   metoprolol tartrate  25 mg Oral BID   midodrine  5 mg Oral TID WC   multivitamin  1 tablet Oral QHS   pantoprazole (PROTONIX) IV  40 mg Intravenous Q12H   potassium chloride  40 mEq Oral BID   sodium chloride flush  3 mL Intravenous Q12H   Continuous Infusions:  cefTRIAXone (ROCEPHIN)  IV Stopped (10/02/21 0851)     LOS: 8 days    Flora Lipps, MD [facility] 10/03/2021, 7:34 AM

## 2021-10-03 NOTE — TOC Progression Note (Signed)
Transition of Care [facility]) - Progression Note    Patient Details  Name: Daniel Andersen MRN: [id number] Date of Birth: 1931/10/29  [facility]) CM/SW Contact  Bertis Hustead, Juliann Pulse, RN Phone Number: 10/03/2021, 2:53 PM  Clinical Narrative:  Left vm w/dtr [name] for choice from bed offers for SNF-await response.     Expected Discharge Plan: [place] Barriers to Discharge: Continued Medical Work up  Expected Discharge Plan and Services Expected Discharge Plan: [place]   Discharge Planning Services: CM Consult   Living arrangements for the past 2 months: Single Family Home                                       Social Determinants of Health (SDOH) Interventions    Readmission Risk Interventions No flowsheet data found.

## 2021-10-03 NOTE — Progress Notes (Signed)
Patient had a least one large bloody loose stool throughout the night.

## 2021-10-03 NOTE — Plan of Care (Signed)

## 2021-10-03 NOTE — Care Management Important Message (Signed)
Important Message  Patient Details IM Letter placed in Patients room. Name: Daniel Andersen MRN: [id number] Date of Birth: 12/11/1931   Medicare Important Message Given:  Yes     Kerin Salen 10/03/2021, 2:44 PM

## 2021-10-04 LAB — CBC
HCT: 23.7 % — ABNORMAL LOW (ref 39.0–52.0)
Hemoglobin: 8.1 g/dL — ABNORMAL LOW (ref 13.0–17.0)
MCH: 30.8 pg (ref 26.0–34.0)
MCHC: 34.2 g/dL (ref 30.0–36.0)
MCV: 90.1 fL (ref 80.0–100.0)
Platelets: 168 10*3/uL (ref 150–400)
RBC: 2.63 MIL/uL — ABNORMAL LOW (ref 4.22–5.81)
RDW: 24.3 % — ABNORMAL HIGH (ref 11.5–15.5)
WBC: 15.3 10*3/uL — ABNORMAL HIGH (ref 4.0–10.5)
nRBC: 0.5 % — ABNORMAL HIGH (ref 0.0–0.2)

## 2021-10-04 LAB — BASIC METABOLIC PANEL
Anion gap: 18 — ABNORMAL HIGH (ref 5–15)
BUN: 134 mg/dL — ABNORMAL HIGH (ref 8–23)
CO2: 19 mmol/L — ABNORMAL LOW (ref 22–32)
Calcium: 7.1 mg/dL — ABNORMAL LOW (ref 8.9–10.3)
Chloride: 96 mmol/L — ABNORMAL LOW (ref 98–111)
Creatinine, Ser: 4.3 mg/dL — ABNORMAL HIGH (ref 0.61–1.24)
GFR, Estimated: 13 mL/min — ABNORMAL LOW (ref >60)
Glucose, Bld: 118 mg/dL — ABNORMAL HIGH (ref 70–99)
Potassium: 3.2 mmol/L — ABNORMAL LOW (ref 3.5–5.1)
Sodium: 133 mmol/L — ABNORMAL LOW (ref 135–145)

## 2021-10-04 LAB — MAGNESIUM: Magnesium: 1.6 mg/dL — ABNORMAL LOW (ref 1.7–2.4)

## 2021-10-04 MED ORDER — POTASSIUM CHLORIDE CRYS ER 20 MEQ PO TBCR
40.0000 meq | EXTENDED_RELEASE_TABLET | Freq: Two times a day (BID) | ORAL | Status: AC
  Administered 2021-10-04 (×2): 40 meq via ORAL
  Filled 2021-10-04 (×2): qty 2

## 2021-10-04 MED ORDER — POTASSIUM CHLORIDE 10 MEQ/100ML IV SOLN
10.0000 meq | INTRAVENOUS | Status: AC
  Administered 2021-10-04 (×4): 10 meq via INTRAVENOUS
  Filled 2021-10-04 (×3): qty 100

## 2021-10-04 MED ORDER — ADULT MULTIVITAMIN W/MINERALS CH
1.0000 | ORAL_TABLET | Freq: Every day | ORAL | Status: DC
  Administered 2021-10-04 – 2021-10-05 (×2): 1 via ORAL
  Filled 2021-10-04 (×2): qty 1

## 2021-10-04 MED ORDER — GERHARDT'S BUTT CREAM
TOPICAL_CREAM | Freq: Every day | CUTANEOUS | Status: DC
  Filled 2021-10-04: qty 1

## 2021-10-04 MED ORDER — MAGNESIUM SULFATE 2 GM/50ML IV SOLN
2.0000 g | Freq: Once | INTRAVENOUS | Status: AC
  Administered 2021-10-04: 2 g via INTRAVENOUS
  Filled 2021-10-04: qty 50

## 2021-10-04 NOTE — TOC Progression Note (Signed)
Transition of Care [facility]) - Progression Note    Patient Details  Name: Daniel Andersen MRN: [id number] Date of Birth: 05/10/1932  Transition of Care [facility]) CM/SW Contact  Kemiya Batdorf, Juliann Pulse, RN Phone Number: 10/04/2021, 10:21 AM  Clinical Narrative: Wandra Feinstein chosen by dtr Berton Lan rep aware to start auth-await auth.      Expected Discharge Plan: Skilled Nursing Facility Barriers to Discharge: Insurance Authorization  Expected Discharge Plan and Services Expected Discharge Plan: [place]   Discharge Planning Services: CM Consult   Living arrangements for the past 2 months: Single Family Home                                       Social Determinants of Health (SDOH) Interventions    Readmission Risk Interventions No flowsheet data found.

## 2021-10-04 NOTE — Progress Notes (Signed)
Palliative Medicine Inpatient Follow Up Note     Chart Reviewed. Patient assessed at the bedside.   Daniel Andersen is awake, weak appearing. Answers most questions appropriately. No family at the bedside.   I spoke with daughter, [name] at length. Updates provided. Continued discussions regarding Daniel Andersen's poor prognosis and no signs of meaningful recovery or improvement. He continues to complain of weakness or preference to be "left alone". Has been refusing to work with PT on several occassions.   I had honest discussion with [name] with concerns despite continued PT evaluation or rehab patient will either continue to decline to participate and most likely his health will continue to decline making it difficult to tolerate or have any initiative to participate in therapy. Gloria verbalizes understanding.   Recommendations to consider a more comfort focused care and hospice was provided and discussed. Education provided on what this would look like for DanielAndersen while hospitalized and outpatient. [name] is tearful in discussion expressing she is overwhelmed as she knows her father has not been doing well and does not want him to suffer. She speaks to all of the health challenges he is battling with awareness that he is tired mentally and physically.   She asked appropriate questions regarding hospice. She is concerned about the availability of hospice facilities as she does not think going home would be an option due to his symptom burden or needs. She is aware the local facilities closest to her are in [facility] or [place]. [name] states she has to get to an appointment to discuss her father's affairs but wishes for some time to discuss with her boyfriend. She will plan to reach back out later tomorrow with any decisions or questions.    Discussed the importance of continued conversation with family and their  medical providers regarding overall plan of care and treatment options, ensuring  decisions are within the context of the patients values and GOCs.   Questions addressed and support provided.    Objective Assessment: Vital Signs Vitals:   10/04/21 1004 10/04/21 1256  BP: 113/64 (!) 103/57  Pulse: (!) 102   Resp:    Temp:  (!) 97.3 F (36.3 C)  SpO2:      Intake/Output Summary (Last 24 hours) at 10/04/2021 1604 Last data filed at 10/04/2021 4235 Gross per 24 hour  Intake 900 ml  Output 1575 ml  Net -675 ml    Last Weight  Most recent update: 10/04/2021  7:07 AM    Weight  74.8 kg (164 lb 14.5 oz)            Gen:  NAD, frail, chronically ill-appearing  HEENT: moist mucous membranes CV: Tachycardic, Irregular  PULM: diminished bilaterally  ABD: soft/nontender/nondistended/normal bowel sounds EXT: bilateral lower extremity edema, RLE erythema, dressing intact Neuro: alert and awake, poor insight   SUMMARY OF RECOMMENDATIONS   Continue with current plan of care per medical team. Extensive discussions with daughter regarding patient's poor prognosis and minimal chance at meaningful recovery. [name] verbalized understanding expressing her sadness of her father's condition. She acknowledges he has not done well for some time and seems to be rapidly declining. She does not want him to suffer.  Recommendations given for hospice support and a more comfort focused level of care. Detailed discussions regarding goals, philosophy of care, and options for ongoing hospice support. [name] would like to further discuss with her boyfriend and have time to process her thoughts and feelings. We will plan to touch base  tomorrow for further discussions.  Daniel Andersen would be hospice appropriate in the setting family transitioned care to focus on his comfort. His prognosis remains poor despite interventions.  His hemoglobin continues to decline, worsening renal function, poor nutritional, in addition to widespread metastatic renal cancer.  PMT will continue to support and follow  to assist in ongoing goals of care discussions and complex decision making. Please secure chat for urgent needs.    Time Total: 55 min.   Visit consisted of counseling and education dealing with the complex and emotionally intense issues of symptom management and palliative care in the setting of serious and potentially life-threatening illness.Greater than 50%  of this time was spent counseling and coordinating care related to the above assessment and plan.  Alda Lea, AGPCNP-BC  Palliative Medicine Team [phone]

## 2021-10-04 NOTE — Progress Notes (Signed)
Nutrition Follow-up  DOCUMENTATION CODES:   Not applicable  INTERVENTION:  - continue 30 ml Prosource Plus BID and Nepro Shake once/day. - will change rena-vite to standard multivitamin with minerals/day.  - recommend diet liberalization from Renal to 2 gram Na.    NUTRITION DIAGNOSIS:   Increased nutrient needs related to acute illness (sepsis) as evidenced by estimated needs. -ongoing  GOAL:   Patient will meet greater than or equal to 90% of their needs -likely minimally met on average   MONITOR:   PO intake, Supplement acceptance, Labs, Weight trends, Skin, I & O's  ASSESSMENT:   87 year old male with medical history of essential HTN and alcohol use. He presented to the ED on 12/7 via EMD due to AMS and worsening RLE wound and BLE edema since Thanksgiving.  Patient is noted to be a/o to self and place. Meal intakes over the past week have mainly been 50-100%; most recently documented intakes were 50% of breakfast on 12/14 and 20% of dinner on 12/15.   He has been accepting Nepro and Prosource Plus 100% of the time offered.   Weight today is stable since 12/11. Moderate pitting edema to BLE documented in the edema section of flow sheet.    Per notes, ongoing [place] discussions and decisions.    Labs reviewed; Na: 133 mmol/l, K: 3.2 mmol/l, Cl: 96 mmol/l, BUN: 134 mg/dl, creatinine: 4.3 mg/dl, Ca: 7.1 mg/dl, Mg: 1.6 mg/dl, GFR: 13 ml/min.   Medications reviewed; 40 mg IV lasix/day, 2 g IV Mg sulfate x1 run 12/16, 1 tablet rena-vite/day, 40 mg IV protonix BID, 10 mEq IV KCl x4 runs 12/16, 40 mEq Klor-Con BID.   Diet Order:   Diet Order             Diet renal with fluid restriction Room service appropriate? Yes; Fluid consistency: Thin  Diet effective now                   EDUCATION NEEDS:   Not appropriate for education at this time  Skin:  Skin Assessment: Skin Integrity Issues: Skin Integrity Issues:: Other (Comment) Other: MASD to coccyx and bilateral  buttocks; non-pressure injury to R foot  Last BM:  12/16 (type 6 x1, small amount)  Height:   Ht Readings from Last 1 Encounters:  09/25/21 _0  (1.727 m)    Weight:   Wt Readings from Last 1 Encounters:  10/04/21 74.8 kg    Estimated Nutritional Needs:  Kcal:  1800-2000 kcal Protein:  90-100 grams Fluid:  >/= 1.2 L/day     Jarome Matin, MS, RD, LDN, CNSC Inpatient Clinical Dietitian RD pager # available in [facility]  After hours/weekend pager # available in [facility]

## 2021-10-04 NOTE — Progress Notes (Signed)
PROGRESS NOTE    Daniel Andersen  CZY:[id number] DOB: 1932-02-11 DOA: 09/25/2021 PCP: Carlena Hurl, PA-C    Brief Narrative:   Daniel Andersen is a 87 year old male with past medical history of hypertension, alcohol abuse presented to the hospital on 09/25/2021 with altered mental status and worsening lower extremity wounds since Thanksgiving.  In the ED, initial vitals were stable.  Lab showed  BUN 115, creatinine 5.18, alkaline phosphatase 199, AST 71, ALT 157, total bilirubin 1.3.  BNP 3000 and urine 27.9.  Lactic acid 1.5.  WBC 22.6.  Hemoglobin 10.6, platelet count 180.  COVID-19 PCR negative.  Influenza A/B PCR negative.  EtOH level less than 10.  Chest x-ray showed cardiomegaly, vascular congestion, pulmonary edema and bilateral effusions.  Right foot and tibia/fibula x-ray with no acute osseous abnormality.  Renal ultrasound with significant hydronephrosis bilaterally extending to the level of the urinary bladder suggestive of bladder outlet obstruction.  CT chest without contrast showed changes consistent with bony metastatic disease likely related to prostate carcinoma, bilateral pleural effusions with lower lobe atelectasis with no focal mass lesion noted.  Patient was started on IV vancomycin and cefepime and received 500 cc normal saline.  Hospitalist service was then consulted for further evaluation and management.  Assessment & Plan:   Principal Problem:   Sepsis due to cellulitis [facility]) Active Problems:   Essential hypertension   Acute renal failure (ARF) (HCC)   Macrocytic anemia   Pleural effusion, bilateral  Sepsis, secondary to right lower extremity cellulitis On IV Rocephin at this time.  Blood cultures negative so far.  Continue wound dressing.  Plan is complete 10 days total of antibiotics.  Leukocytosis persists and latest WBC count of 15.3.Marland Kitchen    Acute renal failure with metabolic acidosis secondary to Obstructive uropathy Initial creatinine on presentation was   5.18.  Renal ultrasound showed significant bilateral hydronephrosis with bladder outlet obstruction.   Urology was consulted but due to poor prognosis no intervention was advised.  Continue Foley catheter.  Intake and output charting.  Creatinine today at the 4.0.  Acute systolic congestive heart failure, new diagnosis  Patient had large bilateral pleural effusions, significant peripheral edema and elevated BNP at  3827. 2D echocardiogram showed  LVEF 25-30%, LV severely decreased function with global hypokinesis, mild concentric LVH, LV diastolic parameters indeterminate, LA mildly dilated, RV systolic function severely reduced, moderate pleural effusion left lateral region, trivial MR, IVC dilated.  Cardiology initially consulted now signed off as of 09/29/2021.  Continue metoprolol, Lasix, , strict intake and output charting.  Lasix has been changed to 40 IV daily..  Fluid restriction removed due to thirst.  Patient is negative balance for 6802 mL  Atrial fibrillation with RVR.   TSH: 1.541, continue amiodarone and metoprolol.  Better controlled now.  Osseous metastatic lesions concerning for prostate cancer as primary Several osseous metastasis.   Palliative care on board for goals of care, discussing undergoing.  Anemia, intermittent GI bleed. Heparin has been discontinued.  Continue Protonix 40 mg twice daily.   Hemoglobin of 8.1 today.  We will continue to monitor.  Patient has very poor prognosis, so GI intervention has not been considered so far.    Elevated LFTs: Improving Thought to be multifactorial from sepsis, congestive hepatomegaly.  Check CMP in AM.  Hypokalemia Potassium of 3.2 today.  Continue to replenish orally through IV since the patient is on diuretic..   Mild hyponatremia.   We will continue to monitor.  Check BMP  in AM.  Hypomagnesemia. Will replenish through IV.  Check BMP in AM.  Thrombocytopenia.  Has improved, latest platelet count of 168.  Alcohol use  disorder Patient had reported drinking 1-2 beers daily at home.  Was initially on on CIWA protocol days off protocol at this time.  Appears to be stable.  Ethics/goals of care.  Patient's prognosis appears to be poor at this time.  Palliative care on board. .  DVT prophylaxis: Place and maintain sequential compression device Start: 10/02/21 1519       Code Status: DNR  Family Communication:   I spoke with the patient's daughter on the phone today and explained the potential overall poor prognosis of the patient.  I have encouraged her to discuss with palliative care team.  Disposition Plan:  Unsure at this time.  Possible skilled nursing facility if patient improves if not, will benefit from hospice care.  Status is: Inpatient  Remains inpatient appropriate because:  IV diuresis, IV antibiotics, goals of care discussion, electrolyte imbalance,    Consultants:  Urology Cardiology -signed off 09/30/19/2022 Palliative care  Procedures:  Foley catheter placement 09/25/21 PRBC transfusion  Antimicrobials:  Ceftriaxone 12/9>> completed  Subjective: Today, patient was seen and examined at bedside.  Patient had bowel movement but denies any blood in stool.  Denies any chest pain, shortness of breath.   Objective: Vitals:   10/03/21 1323 10/03/21 2049 10/04/21 0500 10/04/21 0549  BP: (!) 102/56 111/70  (!) 106/50  Pulse: (!) 103 (!) 104  97  Resp: 17 15  17   Temp: 97.6 F (36.4 C) (!) 97.5 F (36.4 C)  97.8 F (36.6 C)  TempSrc: Oral Oral  Oral  SpO2: 100% 99%  100%  Weight:   74.8 kg   Height:        Intake/Output Summary (Last 24 hours) at 10/04/2021 0709 Last data filed at 10/04/2021 5329 Gross per 24 hour  Intake 863 ml  Output 2075 ml  Net -1212 ml    Filed Weights   10/02/21 0500 10/03/21 0500 10/04/21 0500  Weight: 75.3 kg 75 kg 74.8 kg    Physical examination:  General:  Average built, not in obvious distress, chronically ill, appears older than  stated age, alert awake and communicative HENT:   No scleral pallor or icterus noted. Oral mucosa is moist.  Chest:   Diminished breath sounds bilaterally.  CVS: S1 &S2 heard. No murmur.  Regular rate and rhythm. Abdomen: Soft, nontender, nondistended.  Bowel sounds are heard.   Extremities: No cyanosis, clubbing, right lower extremity with dressing Psych: Alert, awake and oriented to self and month,, normal mood CNS:  No cranial nerve deficits.  Moves all the extremities. Skin: Right lower extremity erythema edema on presentation.      Data Reviewed: I have personally reviewed the following labs and imaging studies   CBC: Recent Labs  Lab 09/30/21 0449 10/01/21 0508 10/01/21 1725 10/02/21 0444 10/02/21 1344 10/03/21 0450 10/04/21 0503  WBC 13.7* 17.2*  --  14.5*  --  16.1* 15.3*  HGB 10.0* 8.5* 8.4* 7.4* 7.9* 8.1* 8.1*  HCT 30.4* 24.9* 25.2* 22.1* 23.1* 23.6* 23.7*  MCV 103.4* 100.8*  --  89.8  --  87.4 90.1  PLT 136* 140*  --  157  --  156 924    Basic Metabolic Panel: Recent Labs  Lab 09/30/21 0449 10/01/21 0508 10/02/21 0444 10/03/21 0450 10/04/21 0503  NA 132* 134* 132* 131* 133*  K 2.9* 3.8 3.2* 2.8* 3.2*  CL 97* 102 98 95* 96*  CO2 17* 18* 17* 21* 19*  GLUCOSE 124* 135* 131* 158* 118*  BUN 128* 150* 150* 148* PENDING  CREATININE 4.46* 4.19* 4.09* 4.15* 4.30*  CALCIUM 7.2* 7.1* 7.5* 7.3* 7.1*  MG 1.7 2.0 1.7 1.7 1.6*    GFR: Estimated Creatinine Clearance: 11.7 mL/min (A) (by C-G formula based on SCr of 4.3 mg/dL (H)). Liver Function Tests: Recent Labs  Lab 09/28/21 0522 09/29/21 0518 09/30/21 0449 10/01/21 0508 10/02/21 0444  AST 52* 33 52* 45* 34  ALT 95* 67* 69* 56* 47*  ALKPHOS 151* 126 146* 124 110  BILITOT 0.6 0.3 0.6 0.4 0.5  PROT 6.1* 6.0* 6.0* 5.3* 5.0*  ALBUMIN 2.4* 2.7* 2.6* 2.3* 2.1*    No results for input(s): LIPASE, AMYLASE in the last 168 hours. No results for input(s): AMMONIA in the last 168 hours. Coagulation  Profile: No results for input(s): INR, PROTIME in the last 168 hours.  Cardiac Enzymes: No results for input(s): CKTOTAL, CKMB, CKMBINDEX, TROPONINI in the last 168 hours. BNP (last 3 results) No results for input(s): PROBNP in the last 8760 hours. HbA1C: No results for input(s): HGBA1C in the last 72 hours. CBG: Recent Labs  Lab 09/27/21 0726 09/27/21 1142  GLUCAP 111* 175*    Lipid Profile: No results for input(s): CHOL, HDL, LDLCALC, TRIG, CHOLHDL, LDLDIRECT in the last 72 hours. Thyroid Function Tests: No results for input(s): TSH, T4TOTAL, FREET4, T3FREE, THYROIDAB in the last 72 hours.  Anemia Panel: No results for input(s): VITAMINB12, FOLATE, FERRITIN, TIBC, IRON, RETICCTPCT in the last 72 hours.  Sepsis Labs: Recent Labs  Lab 09/28/21 0522  PROCALCITON 4.54     Recent Results (from the past 240 hour(s))  Culture, blood (routine x 2)     Status: None   Collection Time: 09/25/21  6:35 PM   Specimen: BLOOD  Result Value Ref Range Status   Specimen Description   Final    BLOOD SITE NOT SPECIFIED Performed at [facility] [address] [place], [place] [address]    Special Requests   Final    BOTTLES DRAWN AEROBIC AND ANAEROBIC Blood Culture adequate volume Performed at [facility] [address] [place], [place] [address]    Culture   Final    NO GROWTH 5 DAYS Performed at [facility], [place] [address], [place], [place] [address]    Report Status 09/30/2021 FINAL  Final  Culture, blood (routine x 2)     Status: None   Collection Time: 09/25/21  6:35 PM   Specimen: BLOOD  Result Value Ref Range Status   Specimen Description   Final    BLOOD BLOOD LEFT HAND Performed at [facility] [address], [place], [place] [address]    Special Requests   Final    BOTTLES DRAWN AEROBIC AND ANAEROBIC Blood Culture results may not be optimal due to an inadequate volume of blood received in culture  bottles Performed at [facility] [address] [place], [place] [address]    Culture   Final    NO GROWTH 5 DAYS Performed at [facility], [place] [address] [place], [place] [address]    Report Status 09/30/2021 FINAL  Final  Resp Panel by RT-PCR (Flu A&B, Covid) Peripheral     Status: None   Collection Time: 09/25/21  6:35 PM   Specimen: Peripheral; Nasopharyngeal(NP) swabs in vial transport medium  Result Value  Ref Range Status   SARS Coronavirus 2 by RT PCR NEGATIVE NEGATIVE Final    Comment: (NOTE) SARS-CoV-2 target nucleic acids are NOT DETECTED.  The SARS-CoV-2 RNA is generally detectable in upper respiratory specimens during the acute phase of infection. The lowest concentration of SARS-CoV-2 viral copies this assay can detect is 138 copies/mL. A negative result does not preclude SARS-Cov-2 infection and should not be used as the sole basis for treatment or other patient management decisions. A negative result may occur with  improper specimen collection/handling, submission of specimen other than nasopharyngeal swab, presence of viral mutation(s) within the areas targeted by this assay, and inadequate number of viral copies(<138 copies/mL). A negative result must be combined with clinical observations, patient history, and epidemiological information. The expected result is Negative.  Fact Sheet for Patients:  EntrepreneurPulse.com.au  Fact Sheet for Healthcare Providers:  IncredibleEmployment.be  This test is no t yet approved or cleared by the [place] FDA and  has been authorized for detection and/or diagnosis of SARS-CoV-2 by FDA under an Emergency Use Authorization (EUA). This EUA will remain  in effect (meaning this test can be used) for the duration of the COVID-19 declaration under Section 564(b)(1) of the Act, 21 U.S.C.section 360bbb-3(b)(1), unless the authorization is terminated  or revoked  sooner.       Influenza A by PCR NEGATIVE NEGATIVE Final   Influenza B by PCR NEGATIVE NEGATIVE Final    Comment: (NOTE) The Xpert Xpress SARS-CoV-2/FLU/RSV plus assay is intended as an aid in the diagnosis of influenza from Nasopharyngeal swab specimens and should not be used as a sole basis for treatment. Nasal washings and aspirates are unacceptable for Xpert Xpress SARS-CoV-2/FLU/RSV testing.  Fact Sheet for Patients: EntrepreneurPulse.com.au  Fact Sheet for Healthcare Providers: IncredibleEmployment.be  This test is not yet approved or cleared by the [place] FDA and has been authorized for detection and/or diagnosis of SARS-CoV-2 by FDA under an Emergency Use Authorization (EUA). This EUA will remain in effect (meaning this test can be used) for the duration of the COVID-19 declaration under Section 564(b)(1) of the Act, 21 U.S.C. section 360bbb-3(b)(1), unless the authorization is terminated or revoked.  Performed at [facility], [place] [address] [place], [place] [address]   MRSA Next Gen by PCR, Nasal     Status: None   Collection Time: 09/26/21  2:22 PM   Specimen: Nasal Mucosa; Nasal Swab  Result Value Ref Range Status   MRSA by PCR Next Gen NOT DETECTED NOT DETECTED Final    Comment: (NOTE) The GeneXpert MRSA Assay (FDA approved for NASAL specimens only), is one component of a comprehensive MRSA colonization surveillance program. It is not intended to diagnose MRSA infection nor to guide or monitor treatment for MRSA infections. Test performance is not FDA approved in patients less than 52 years old. Performed at [facility], [place] [address], [place], [place] [address]       Radiology Studies: No results found.  Scheduled Meds:  (feeding supplement) PROSource Plus  30 mL Oral BID BM   amiodarone  200 mg Oral Daily   Chlorhexidine Gluconate Cloth  6 each Topical Daily    feeding supplement (NEPRO CARB STEADY)  237 mL Oral Q24H   furosemide  40 mg Intravenous Daily   Gerhardt's butt cream   Topical Daily   mouth rinse  15 mL Mouth Rinse BID   metoprolol tartrate  25 mg Oral BID   midodrine  5 mg  Oral TID WC   multivitamin  1 tablet Oral QHS   pantoprazole (PROTONIX) IV  40 mg Intravenous Q12H   potassium chloride  40 mEq Oral BID   sodium chloride flush  3 mL Intravenous Q12H   Continuous Infusions:  cefTRIAXone (ROCEPHIN)  IV Stopped (10/03/21 1045)   magnesium sulfate bolus IVPB     potassium chloride       LOS: 9 days    Flora Lipps, MD [facility] 10/04/2021, 7:09 AM

## 2021-10-04 NOTE — Plan of Care (Signed)

## 2021-10-05 LAB — MAGNESIUM: Magnesium: 2 mg/dL (ref 1.7–2.4)

## 2021-10-05 LAB — BASIC METABOLIC PANEL
Anion gap: 16 — ABNORMAL HIGH (ref 5–15)
BUN: 118 mg/dL — ABNORMAL HIGH (ref 8–23)
CO2: 19 mmol/L — ABNORMAL LOW (ref 22–32)
Calcium: 7.3 mg/dL — ABNORMAL LOW (ref 8.9–10.3)
Chloride: 95 mmol/L — ABNORMAL LOW (ref 98–111)
Creatinine, Ser: 4.01 mg/dL — ABNORMAL HIGH (ref 0.61–1.24)
GFR, Estimated: 14 mL/min — ABNORMAL LOW (ref >60)
Glucose, Bld: 118 mg/dL — ABNORMAL HIGH (ref 70–99)
Potassium: 3.4 mmol/L — ABNORMAL LOW (ref 3.5–5.1)
Sodium: 130 mmol/L — ABNORMAL LOW (ref 135–145)

## 2021-10-05 LAB — CBC
HCT: 21.4 % — ABNORMAL LOW (ref 39.0–52.0)
Hemoglobin: 7.1 g/dL — ABNORMAL LOW (ref 13.0–17.0)
MCH: 30.9 pg (ref 26.0–34.0)
MCHC: 33.2 g/dL (ref 30.0–36.0)
MCV: 93 fL (ref 80.0–100.0)
Platelets: 178 10*3/uL (ref 150–400)
RBC: 2.3 MIL/uL — ABNORMAL LOW (ref 4.22–5.81)
RDW: 24 % — ABNORMAL HIGH (ref 11.5–15.5)
WBC: 14.5 10*3/uL — ABNORMAL HIGH (ref 4.0–10.5)
nRBC: 0.5 % — ABNORMAL HIGH (ref 0.0–0.2)

## 2021-10-05 MED ORDER — ONDANSETRON HCL 4 MG/2ML IJ SOLN: 4.0000 mg | Freq: Four times a day (QID) | INTRAMUSCULAR | Status: DC | PRN

## 2021-10-05 MED ORDER — MAGNESIUM OXIDE -MG SUPPLEMENT 400 (240 MG) MG PO TABS: 400.0000 mg | ORAL_TABLET | Freq: Two times a day (BID) | ORAL | Status: DC

## 2021-10-05 MED ORDER — ONDANSETRON 4 MG PO TBDP: 4.0000 mg | ORAL_TABLET | Freq: Four times a day (QID) | ORAL | Status: DC | PRN

## 2021-10-05 MED ORDER — POLYVINYL ALCOHOL 1.4 % OP SOLN
1.0000 [drp] | Freq: Four times a day (QID) | OPHTHALMIC | Status: DC | PRN
  Filled 2021-10-05: qty 15

## 2021-10-05 MED ORDER — DIPHENHYDRAMINE HCL 50 MG/ML IJ SOLN
12.5000 mg | Freq: Once | INTRAMUSCULAR | Status: AC
  Administered 2021-10-05: 12.5 mg via INTRAVENOUS
  Filled 2021-10-05: qty 1

## 2021-10-05 MED ORDER — BIOTENE DRY MOUTH MT LIQD: 15.0000 mL | OROMUCOSAL | Status: DC | PRN

## 2021-10-05 MED ORDER — GLYCOPYRROLATE 0.2 MG/ML IJ SOLN
0.2000 mg | INTRAMUSCULAR | Status: DC | PRN
  Filled 2021-10-05: qty 1

## 2021-10-05 MED ORDER — HALOPERIDOL LACTATE 5 MG/ML IJ SOLN: 0.5000 mg | INTRAMUSCULAR | Status: DC | PRN

## 2021-10-05 MED ORDER — LORAZEPAM 2 MG/ML IJ SOLN
1.0000 mg | INTRAMUSCULAR | Status: DC | PRN
  Administered 2021-10-07: 22:00:00 1 mg via INTRAVENOUS
  Filled 2021-10-05 (×2): qty 1

## 2021-10-05 MED ORDER — HYDROMORPHONE HCL 1 MG/ML IJ SOLN
0.5000 mg | INTRAMUSCULAR | Status: DC | PRN
  Administered 2021-10-05 – 2021-10-06 (×2): 1 mg via INTRAVENOUS
  Filled 2021-10-05 (×3): qty 1

## 2021-10-05 MED ORDER — POTASSIUM CHLORIDE CRYS ER 20 MEQ PO TBCR
40.0000 meq | EXTENDED_RELEASE_TABLET | Freq: Two times a day (BID) | ORAL | Status: DC
  Administered 2021-10-05: 40 meq via ORAL
  Filled 2021-10-05: qty 2

## 2021-10-05 NOTE — Progress Notes (Signed)
PROGRESS NOTE    Daniel Andersen  YHC:[id number] DOB: 07/29/1932 DOA: 09/25/2021 PCP: Carlena Hurl, PA-C    Brief Narrative:   Daniel Andersen is a 87 year old male with past medical history of hypertension, alcohol abuse presented to the hospital on 09/25/2021 with altered mental status and worsening lower extremity wounds since Thanksgiving.  In the ED, initial vitals were stable.  Lab showed  BUN 115, creatinine 5.18, alkaline phosphatase 199, AST 71, ALT 157, total bilirubin 1.3.  BNP 3000 and urine 27.9.  Lactic acid 1.5.  WBC 22.6.  Hemoglobin 10.6, platelet count 180.  COVID-19 PCR negative.  Influenza A/B PCR negative.  EtOH level less than 10.  Chest x-ray showed cardiomegaly, vascular congestion, pulmonary edema and bilateral effusions.  Right foot and tibia/fibula x-ray with no acute osseous abnormality.  Renal ultrasound with significant hydronephrosis bilaterally extending to the level of the urinary bladder suggestive of bladder outlet obstruction.  CT chest without contrast showed changes consistent with bony metastatic disease likely related to prostate carcinoma, bilateral pleural effusions with lower lobe atelectasis with no focal mass lesion noted.  Patient was started on IV vancomycin and cefepime and received 500 cc normal saline.  Hospitalist service was then consulted for further evaluation and management.  Assessment & Plan:   Principal Problem:   Sepsis due to cellulitis [facility]) Active Problems:   Essential hypertension   Acute renal failure (ARF) (HCC)   Macrocytic anemia   Pleural effusion, bilateral  Sepsis, secondary to right lower extremity cellulitis Completed IV Rocephin course.  Blood cultures negative so far.  Continue wound dressing.  Mild leukocytosis but trending down.  T-max of 97.8 F.  Acute renal failure with metabolic acidosis secondary to Obstructive uropathy Initial creatinine on presentation was  5.18.  Renal ultrasound showed significant  bilateral hydronephrosis with bladder outlet obstruction.   Urology was consulted but due to poor prognosis no intervention was advised.  Continue Foley catheter.  Continue to monitor intake and output charting.  Creatinine today at the 4.0  Acute systolic congestive heart failure, new diagnosis  Patient had large bilateral pleural effusions, significant peripheral edema and elevated BNP at  3827. 2D echocardiogram showed  LVEF 25-30%, LV severely decreased function with global hypokinesis. Cardiology initially consulted now signed off as of 09/29/2021.  Continue metoprolol, Lasix, , strict intake and output charting.  Lasix has been changed to 40mg  IV daily from IV twice daily.  Fluid restriction removed due to thirst.  Patient is negative balance for 8022 mL  Atrial fibrillation with RVR.   TSH: 1.541, continue amiodarone and metoprolol.  Better controlled now.  Osseous metastatic lesions concerning for prostate cancer as primary Several osseous metastasis.   Palliative care on board for goals of care, discussion underway.  Anemia, intermittent GI bleed. Heparin has been discontinued.  Continue Protonix 40 mg twice daily.   Hemoglobin of 7.1 today.  We will continue to monitor.  Patient has very poor prognosis, so GI intervention has not been considered so far.    Elevated LFTs: Improving Thought to be multifactorial from sepsis, congestive hepatomegaly.    Hypokalemia Potassium of 3.4 today.  Continue to replenish since patient is on diuretic.  Magnesium of 2.0   Mild hyponatremia.   We will continue to monitor.    Hypomagnesemia. Plan is through IV.  Magnesium 2.0 today.  Thrombocytopenia.  Has improved, latest platelet count of 178.  Alcohol use disorder Patient had reported drinking 1-2 beers daily at home.  Was initially on on CIWA protocol days off protocol at this time.  Appears to be stable.  Ethics/goals of care.  Patient's prognosis appears to be poor at this time.   Palliative care on board.  Discussion underway for potential hospice.  DVT prophylaxis: Place and maintain sequential compression device Start: 10/02/21 1519       Code Status: DNR  Family Communication:   I spoke with the patient's daughter on the phone on 10/04/2021.    Disposition Plan:  Undecided at this time.  Possible hospice on discharge.  Status is: Inpatient  Remains inpatient appropriate because:  IV diuresis, goals of care discussion, electrolyte imbalance,    Consultants:  Urology Cardiology -signed off 09/30/19/2022 Palliative care  Procedures:  Foley catheter placement 09/25/21 PRBC transfusion  Antimicrobials:  Ceftriaxone 12/9>> completed  Subjective: Today, seen and examined at bedside.  Patient states that he is okay.  Denies any nausea vomiting or pain.  Some ice cream.  Eating some ice cream.  Objective: Vitals:   10/04/21 2305 10/04/21 2343 10/05/21 0500 10/05/21 0626  BP: (!) 101/50 (!) 115/51  104/67  Pulse: (!) 102 (!) 107  (!) 107  Resp:    19  Temp:    (!) 97.5 F (36.4 C)  TempSrc:    Oral  SpO2: 99%   99%  Weight:   72.7 kg   Height:        Intake/Output Summary (Last 24 hours) at 10/05/2021 0743 Last data filed at 10/05/2021 0718 Gross per 24 hour  Intake 600 ml  Output 2000 ml  Net -1400 ml    Filed Weights   10/03/21 0500 10/04/21 0500 10/05/21 0500  Weight: 75 kg 74.8 kg 72.7 kg    Physical examination: General:  Average built, not in obvious distress, chronically ill,  HENT:   No scleral pallor or icterus noted. Oral mucosa is moist.  Chest:    Diminished breath sounds bilaterally. CVS: S1 &S2 heard. No murmur.  Regular rate and rhythm.  Irregular. Abdomen: Soft, nontender, nondistended.  Bowel sounds are heard.   Extremities: No cyanosis, clubbing with right lower extremity on dressing with edema Psych: Alert, awake and oriented to self and month, normal mood CNS:  No cranial nerve deficits.  Moves all  extremities Skin: Warm and dry.  Lower extremity with erythema edema on presentation on dressing.    Data Reviewed: I have personally reviewed the following labs and imaging studies   CBC: Recent Labs  Lab 10/01/21 0508 10/01/21 1725 10/02/21 0444 10/02/21 1344 10/03/21 0450 10/04/21 0503 10/05/21 0525  WBC 17.2*  --  14.5*  --  16.1* 15.3* 14.5*  HGB 8.5*   < > 7.4* 7.9* 8.1* 8.1* 7.1*  HCT 24.9*   < > 22.1* 23.1* 23.6* 23.7* 21.4*  MCV 100.8*  --  89.8  --  87.4 90.1 93.0  PLT 140*  --  157  --  156 168 178   < > = values in this interval not displayed.    Basic Metabolic Panel: Recent Labs  Lab 10/01/21 0508 10/02/21 0444 10/03/21 0450 10/04/21 0503 10/05/21 0525  NA 134* 132* 131* 133* 130*  K 3.8 3.2* 2.8* 3.2* 3.4*  CL 102 98 95* 96* 95*  CO2 18* 17* 21* 19* 19*  GLUCOSE 135* 131* 158* 118* 118*  BUN 150* 150* 148* 134* 118*  CREATININE 4.19* 4.09* 4.15* 4.30* 4.01*  CALCIUM 7.1* 7.5* 7.3* 7.1* 7.3*  MG 2.0 1.7 1.7 1.6* 2.0  GFR: Estimated Creatinine Clearance: 12.6 mL/min (A) (by C-G formula based on SCr of 4.01 mg/dL (H)). Liver Function Tests: Recent Labs  Lab 09/29/21 0518 09/30/21 0449 10/01/21 0508 10/02/21 0444  AST 33 52* 45* 34  ALT 67* 69* 56* 47*  ALKPHOS 126 146* 124 110  BILITOT 0.3 0.6 0.4 0.5  PROT 6.0* 6.0* 5.3* 5.0*  ALBUMIN 2.7* 2.6* 2.3* 2.1*    No results for input(s): LIPASE, AMYLASE in the last 168 hours. No results for input(s): AMMONIA in the last 168 hours. Coagulation Profile: No results for input(s): INR, PROTIME in the last 168 hours.  Cardiac Enzymes: No results for input(s): CKTOTAL, CKMB, CKMBINDEX, TROPONINI in the last 168 hours. BNP (last 3 results) No results for input(s): PROBNP in the last 8760 hours. HbA1C: No results for input(s): HGBA1C in the last 72 hours. CBG: No results for input(s): GLUCAP in the last 168 hours.  Lipid Profile: No results for input(s): CHOL, HDL, LDLCALC, TRIG, CHOLHDL,  LDLDIRECT in the last 72 hours. Thyroid Function Tests: No results for input(s): TSH, T4TOTAL, FREET4, T3FREE, THYROIDAB in the last 72 hours.  Anemia Panel: No results for input(s): VITAMINB12, FOLATE, FERRITIN, TIBC, IRON, RETICCTPCT in the last 72 hours.  Sepsis Labs: No results for input(s): PROCALCITON, LATICACIDVEN in the last 168 hours.   Recent Results (from the past 240 hour(s))  Culture, blood (routine x 2)     Status: None   Collection Time: 09/25/21  6:35 PM   Specimen: BLOOD  Result Value Ref Range Status   Specimen Description   Final    BLOOD SITE NOT SPECIFIED Performed at [facility] [address], [place], [place] [address]    Special Requests   Final    BOTTLES DRAWN AEROBIC AND ANAEROBIC Blood Culture adequate volume Performed at [facility] [address], [place], [place] [address]    Culture   Final    NO GROWTH 5 DAYS Performed at [facility], [place] [address], [place], [place] [address]    Report Status 09/30/2021 FINAL  Final  Culture, blood (routine x 2)     Status: None   Collection Time: 09/25/21  6:35 PM   Specimen: BLOOD  Result Value Ref Range Status   Specimen Description   Final    BLOOD BLOOD LEFT HAND Performed at [facility] [address] [place], [place] [address]    Special Requests   Final    BOTTLES DRAWN AEROBIC AND ANAEROBIC Blood Culture results may not be optimal due to an inadequate volume of blood received in culture bottles Performed at [facility] [address] [place], [place] [address]    Culture   Final    NO GROWTH 5 DAYS Performed at [facility], [place] [address] [place],  [address]    Report Status 09/30/2021 FINAL  Final  Resp Panel by RT-PCR (Flu A&B, Covid) Peripheral     Status: None   Collection Time: 09/25/21  6:35 PM   Specimen: Peripheral; Nasopharyngeal(NP) swabs in vial transport medium  Result  Value Ref Range Status   SARS Coronavirus 2 by RT PCR NEGATIVE NEGATIVE Final    Comment: (NOTE) SARS-CoV-2 target nucleic acids are NOT DETECTED.  The SARS-CoV-2 RNA is generally detectable in upper respiratory specimens during the acute phase of infection. The lowest concentration of SARS-CoV-2 viral copies this assay can detect is 138 copies/mL. A negative result does not preclude  SARS-Cov-2 infection and should not be used as the sole basis for treatment or other patient management decisions. A negative result may occur with  improper specimen collection/handling, submission of specimen other than nasopharyngeal swab, presence of viral mutation(s) within the areas targeted by this assay, and inadequate number of viral copies(<138 copies/mL). A negative result must be combined with clinical observations, patient history, and epidemiological information. The expected result is Negative.  Fact Sheet for Patients:  EntrepreneurPulse.com.au  Fact Sheet for Healthcare Providers:  IncredibleEmployment.be  This test is no t yet approved or cleared by the [place] FDA and  has been authorized for detection and/or diagnosis of SARS-CoV-2 by FDA under an Emergency Use Authorization (EUA). This EUA will remain  in effect (meaning this test can be used) for the duration of the COVID-19 declaration under Section 564(b)(1) of the Act, 21 U.S.C.section 360bbb-3(b)(1), unless the authorization is terminated  or revoked sooner.       Influenza A by PCR NEGATIVE NEGATIVE Final   Influenza B by PCR NEGATIVE NEGATIVE Final    Comment: (NOTE) The Xpert Xpress SARS-CoV-2/FLU/RSV plus assay is intended as an aid in the diagnosis of influenza from Nasopharyngeal swab specimens and should not be used as a sole basis for treatment. Nasal washings and aspirates are unacceptable for Xpert Xpress SARS-CoV-2/FLU/RSV testing.  Fact Sheet for  Patients: EntrepreneurPulse.com.au  Fact Sheet for Healthcare Providers: IncredibleEmployment.be  This test is not yet approved or cleared by the [place] FDA and has been authorized for detection and/or diagnosis of SARS-CoV-2 by FDA under an Emergency Use Authorization (EUA). This EUA will remain in effect (meaning this test can be used) for the duration of the COVID-19 declaration under Section 564(b)(1) of the Act, 21 U.S.C. section 360bbb-3(b)(1), unless the authorization is terminated or revoked.  Performed at [facility], [place] [address] [place], [place] [address]   MRSA Next Gen by PCR, Nasal     Status: None   Collection Time: 09/26/21  2:22 PM   Specimen: Nasal Mucosa; Nasal Swab  Result Value Ref Range Status   MRSA by PCR Next Gen NOT DETECTED NOT DETECTED Final    Comment: (NOTE) The GeneXpert MRSA Assay (FDA approved for NASAL specimens only), is one component of a comprehensive MRSA colonization surveillance program. It is not intended to diagnose MRSA infection nor to guide or monitor treatment for MRSA infections. Test performance is not FDA approved in patients less than 2 years old. Performed at [facility], [place] [address] [place], [place] [address]       Radiology Studies: No results found.  Scheduled Meds:  (feeding supplement) PROSource Plus  30 mL Oral BID BM   amiodarone  200 mg Oral Daily   Chlorhexidine Gluconate Cloth  6 each Topical Daily   feeding supplement (NEPRO CARB STEADY)  237 mL Oral Q24H   furosemide  40 mg Intravenous Daily   Gerhardt's butt cream   Topical Daily   mouth rinse  15 mL Mouth Rinse BID   metoprolol tartrate  25 mg Oral BID   midodrine  5 mg Oral TID WC   multivitamin with minerals  1 tablet Oral Daily   pantoprazole (PROTONIX) IV  40 mg Intravenous Q12H   sodium chloride flush  3 mL Intravenous Q12H   Continuous Infusions:    LOS:  10 days    Flora Lipps, MD [facility] 10/05/2021, 7:43 AM

## 2021-10-05 NOTE — Plan of Care (Signed)

## 2021-10-05 NOTE — Progress Notes (Signed)
° °  Palliative Medicine Inpatient Follow Up Note     Chart Reviewed. Patient assessed at the bedside.   Appears weak. He is alert and awake. Will follow most commands. Denies nausea or vomiting. States he aches all over at times. Endorses weakness.   I spoke with daughter, Daniel Andersen to follow-up from previous goals of care discussions. She expresses she has spoken with her boyfriend and wishes for her father to be at peace and no suffering for what time he has left.   Updates provided on renal function, drop in hemoglobin again, minimal oral nutrition or interest, and ongoing weakness. Daughter verbalized understanding. Recommendations again provided to focus on comfort with extensive education regarding what this would look like while hospitalized.   Daniel Andersen verbalized understanding and expresses wishes to focus on comfort with no escalation of care or further work-up. She would like consideration for [facility] to allow him to have ongoing comfort measures for during his last days. Education provided on referral process with awareness she would most likely not be hearing back about request and approval until the first of next week.   Education provided on hospital comfort focused care and symptom management. She verbalized understanding.   Questions addressed and support provided.    Objective Assessment: Vital Signs Vitals:   10/04/21 2343 10/05/21 0626  BP: (!) 115/51 104/67  Pulse: (!) 107 (!) 107  Resp:  19  Temp:  (!) 97.5 F (36.4 C)  SpO2:  99%    Intake/Output Summary (Last 24 hours) at 10/05/2021 1119 Last data filed at 10/05/2021 1013 Gross per 24 hour  Intake 1080 ml  Output 2000 ml  Net -920 ml    Last Weight  Most recent update: 10/05/2021  7:07 AM    Weight  72.7 kg (160 lb 4.4 oz)            Gen:  NAD, frail, chronically ill-appearing  HEENT: moist mucous membranes CV: Tachycardic, Irregular , hypotensive PULM: diminished bilaterally  ABD:  soft/nontender/nondistended/normal bowel sounds EXT: bilateral lower extremity edema, RLE erythema, dressing intact Neuro: alert and awake, poor insight   SUMMARY OF RECOMMENDATIONS   Transition all care to focus on comfort. No escalation. Will minimize medications.  TOC referral for residential hospice. Daughter expresses her preference for [employer]. Education provided on referral process in addition to comfort focused care while hospitalized.  Dilaudid PRN for pain/air hunger/comfort Robinul PRN for excessive secretions Ativan PRN for agitation/anxiety Zofran PRN for nausea Liquifilm tears PRN for dry eyes Haldol PRN for agitation/anxiety May have comfort feeding Comfort cart for family Unrestricted visitations in the setting of EOL (per policy) Oxygen PRN 2L or less for comfort. No escalation.   PMT will continue to support and follow as needed. Please secure chat for urgent needs.   Time Total: 45 min  Visit consisted of counseling and education dealing with the complex and emotionally intense issues of symptom management and palliative care in the setting of serious and potentially life-threatening illness.Greater than 50%  of this time was spent counseling and coordinating care related to the above assessment and plan.  Alda Lea, AGPCNP-BC  Palliative Medicine Team [phone]

## 2021-10-06 MED ORDER — DIPHENHYDRAMINE HCL 50 MG/ML IJ SOLN
12.5000 mg | Freq: Four times a day (QID) | INTRAMUSCULAR | Status: DC | PRN
  Administered 2021-10-08: 01:00:00 12.5 mg via INTRAVENOUS
  Filled 2021-10-06: qty 1

## 2021-10-06 NOTE — Plan of Care (Signed)
  Problem: Pain Managment: Goal: General experience of comfort will improve Outcome: Progressing   

## 2021-10-06 NOTE — Progress Notes (Signed)
PROGRESS NOTE    Daniel Andersen  ZHY:[id number] DOB: 03/03/1932 DOA: 09/25/2021 PCP: Carlena Hurl, PA-C    Brief Narrative:   Daniel Andersen is a 87 year old male with past medical history of hypertension, alcohol abuse presented to the hospital on 09/25/2021 with altered mental status and worsening lower extremity wounds since Thanksgiving.  In the ED, initial vitals were stable.  Lab showed  BUN 115, creatinine 5.18, alkaline phosphatase 199, AST 71, ALT 157, total bilirubin 1.3.  BNP 3000 and urine 27.9.  Lactic acid 1.5.  WBC 22.6.  Hemoglobin 10.6, platelet count 180.  COVID-19 PCR negative.  Influenza A/B PCR negative.  EtOH level less than 10.  Chest x-ray showed cardiomegaly, vascular congestion, pulmonary edema and bilateral effusions.  Right foot and tibia/fibula x-ray with no acute osseous abnormality.  Renal ultrasound with significant hydronephrosis bilaterally extending to the level of the urinary bladder suggestive of bladder outlet obstruction.  CT chest without contrast showed changes consistent with bony metastatic disease likely related to prostate carcinoma, bilateral pleural effusions with lower lobe atelectasis with no focal mass lesion noted.  Patient was started on IV vancomycin and cefepime and received 500 cc normal saline.  Hospitalist service was then consulted for further evaluation and management.  Assessment & Plan:   Principal Problem:   Sepsis due to cellulitis [facility]) Active Problems:   Essential hypertension   Acute renal failure (ARF) (HCC)   Macrocytic anemia   Pleural effusion, bilateral  Sepsis, secondary to right lower extremity cellulitis Completed IV Rocephin course.  Blood cultures negative so far.   Mild leukocytosis but trending down.  T-max of 97.8 F.  Acute renal failure with metabolic acidosis secondary to Obstructive uropathy Initial creatinine on presentation was  5.18.  Renal ultrasound showed significant bilateral hydronephrosis with  bladder outlet obstruction.   Urology was consulted but due to poor prognosis no intervention was advised.  Continue Foley catheter.  Continue to monitor intake and output charting.  Latest creatinine at 4.0  Acute systolic congestive heart failure, new diagnosis  Patient had large bilateral pleural effusions, significant peripheral edema and elevated BNP at  3827. 2D echocardiogram showed  LVEF 25-30%, LV severely decreased function with global hypokinesis. Cardiology initially consulted, now signed off as of 09/29/2021.  Patient is on comfort care at this time.  Continue Lasix IV for comfort.  Atrial fibrillation with RVR.   TSH: 1.541, was on amiodarone and metoprolol.  Currently on comfort care.  Osseous metastatic lesions concerning for prostate cancer as primary Several osseous metastasis.   Palliative care on board.  On comfort care currently  Anemia, intermittent GI bleed. Heparin has been discontinued.  Continue Protonix 40 mg twice daily.   Latest hemoglobin of 7.1 .  Currently on comfort care.  Elevated LFTs: Improving Thought to be multifactorial from sepsis, congestive hepatomegaly.    Hypokalemia Was replenished during hospitalization.    Mild hyponatremia.   Was monitored.  Currently on comfort care was replenished.  Hypomagnesemia. Plan is through IV.  Magnesium 2.0 today.  Thrombocytopenia.  Improved, latest platelet count of 178.  Alcohol use disorder Patient had reported drinking 1-2 beers daily at home.  Was initially on on CIWA protocol days, off protocol at this time.  Appears to be stable.  Ethics/goals of care.  Patient's prognosis appears to be poor at this time.  Currently on comfort care.  DVT prophylaxis:        Code Status: DNR  Family Communication:  I spoke with the patient's daughter on the phone on 10/04/2021.    Disposition Plan:  On comfort care.  Status is: Inpatient  Remains inpatient appropriate because: Comfort  care.  Consultants:  Urology Cardiology -signed off 09/30/19/2022 Palliative care  Procedures:  Foley catheter placement 09/25/21 PRBC transfusion  Antimicrobials:  Ceftriaxone 12/9>> completed  Subjective: Today, patient was seen and examined at bedside.  Patient denies any nausea, vomiting, fever.  Denies overt pain.   Objective: Vitals:   10/05/21 0626 10/05/21 1239 10/06/21 0055 10/06/21 0929  BP: 104/67 108/63 (!) 97/51 (!) 108/52  Pulse: (!) 107 (!) 106 (!) 101 80  Resp: 19 20 20 18   Temp: (!) 97.5 F (36.4 C) 97.8 F (36.6 C) 97.7 F (36.5 C) 98.3 F (36.8 C)  TempSrc: Oral Axillary Axillary Oral  SpO2: 99% 100% 94% 99%  Weight:      Height:        Intake/Output Summary (Last 24 hours) at 10/06/2021 1005 Last data filed at 10/06/2021 0925 Gross per 24 hour  Intake 600 ml  Output 1925 ml  Net -1325 ml    Filed Weights   10/03/21 0500 10/04/21 0500 10/05/21 0500  Weight: 75 kg 74.8 kg 72.7 kg    Physical examination: General:  Average built, not in obvious distress, chronically ill, appears comfortable. HENT:   No scleral pallor or icterus noted. Oral mucosa is moist.  Chest:    Diminished breath sounds bilaterally. CVS: S1 &S2 heard. No murmur.  Regular rate and rhythm.  Irregular. Abdomen: Soft, nontender, nondistended.  Bowel sounds are heard.   Extremities: No cyanosis, clubbing with right lower extremity edema Psych: Alert, awake and oriented to self and month, normal mood CNS:  No cranial nerve deficits.  Moves all extremities Skin: Warm and dry.  Lower extremity with erythema edema on presentation, on dressing.    Data Reviewed: I have personally reviewed the following labs and imaging studies   CBC: Recent Labs  Lab 10/01/21 0508 10/01/21 1725 10/02/21 0444 10/02/21 1344 10/03/21 0450 10/04/21 0503 10/05/21 0525  WBC 17.2*  --  14.5*  --  16.1* 15.3* 14.5*  HGB 8.5*   < > 7.4* 7.9* 8.1* 8.1* 7.1*  HCT 24.9*   < > 22.1* 23.1* 23.6*  23.7* 21.4*  MCV 100.8*  --  89.8  --  87.4 90.1 93.0  PLT 140*  --  157  --  156 168 178   < > = values in this interval not displayed.    Basic Metabolic Panel: Recent Labs  Lab 10/01/21 0508 10/02/21 0444 10/03/21 0450 10/04/21 0503 10/05/21 0525  NA 134* 132* 131* 133* 130*  K 3.8 3.2* 2.8* 3.2* 3.4*  CL 102 98 95* 96* 95*  CO2 18* 17* 21* 19* 19*  GLUCOSE 135* 131* 158* 118* 118*  BUN 150* 150* 148* 134* 118*  CREATININE 4.19* 4.09* 4.15* 4.30* 4.01*  CALCIUM 7.1* 7.5* 7.3* 7.1* 7.3*  MG 2.0 1.7 1.7 1.6* 2.0    GFR: Estimated Creatinine Clearance: 12.6 mL/min (A) (by C-G formula based on SCr of 4.01 mg/dL (H)). Liver Function Tests: Recent Labs  Lab 09/30/21 0449 10/01/21 0508 10/02/21 0444  AST 52* 45* 34  ALT 69* 56* 47*  ALKPHOS 146* 124 110  BILITOT 0.6 0.4 0.5  PROT 6.0* 5.3* 5.0*  ALBUMIN 2.6* 2.3* 2.1*    No results for input(s): LIPASE, AMYLASE in the last 168 hours. No results for input(s): AMMONIA in the last 168 hours.  Coagulation Profile: No results for input(s): INR, PROTIME in the last 168 hours.  Cardiac Enzymes: No results for input(s): CKTOTAL, CKMB, CKMBINDEX, TROPONINI in the last 168 hours. BNP (last 3 results) No results for input(s): PROBNP in the last 8760 hours. HbA1C: No results for input(s): HGBA1C in the last 72 hours. CBG: No results for input(s): GLUCAP in the last 168 hours.  Lipid Profile: No results for input(s): CHOL, HDL, LDLCALC, TRIG, CHOLHDL, LDLDIRECT in the last 72 hours. Thyroid Function Tests: No results for input(s): TSH, T4TOTAL, FREET4, T3FREE, THYROIDAB in the last 72 hours.  Anemia Panel: No results for input(s): VITAMINB12, FOLATE, FERRITIN, TIBC, IRON, RETICCTPCT in the last 72 hours.  Sepsis Labs: No results for input(s): PROCALCITON, LATICACIDVEN in the last 168 hours.   Recent Results (from the past 240 hour(s))  MRSA Next Gen by PCR, Nasal     Status: None   Collection Time: 09/26/21  2:22 PM    Specimen: Nasal Mucosa; Nasal Swab  Result Value Ref Range Status   MRSA by PCR Next Gen NOT DETECTED NOT DETECTED Final    Comment: (NOTE) The GeneXpert MRSA Assay (FDA approved for NASAL specimens only), is one component of a comprehensive MRSA colonization surveillance program. It is not intended to diagnose MRSA infection nor to guide or monitor treatment for MRSA infections. Test performance is not FDA approved in patients less than 35 years old. Performed at [facility], [place] [address], [place], [place] [address]       Radiology Studies: No results found.  Scheduled Meds:  furosemide  40 mg Intravenous Daily   Gerhardt's butt cream   Topical Daily   mouth rinse  15 mL Mouth Rinse BID   pantoprazole (PROTONIX) IV  40 mg Intravenous Q12H   sodium chloride flush  3 mL Intravenous Q12H   Continuous Infusions:    LOS: 11 days    Flora Lipps, MD [facility] 10/06/2021, 10:05 AM

## 2021-10-07 DIAGNOSIS — K922 Gastrointestinal hemorrhage, unspecified: Secondary | ICD-10-CM

## 2021-10-07 DIAGNOSIS — I10 Essential (primary) hypertension: Secondary | ICD-10-CM

## 2021-10-07 DIAGNOSIS — D539 Nutritional anemia, unspecified: Secondary | ICD-10-CM

## 2021-10-07 DIAGNOSIS — R5381 Other malaise: Secondary | ICD-10-CM

## 2021-10-07 NOTE — Progress Notes (Addendum)
Manufacturing engineer [facility]) Hospital Liaison Note  Received request from [employer] [employer] [place] for family interest in [facility]. Visited patient at bedside and spoke with daughter/Gloria to confirm interest and explain services.  Approval for [facility] is determined by [facility] MD. Once [facility] MD has determined [facility] eligibility, [place] will update hospital staff and family.  Please do not hesitate to call with any hospice related questions.    Thank you for the opportunity to participate in this patient's care.  Addendum Patient was approved for [facility]. Unfortunately, there are no beds available today. [facility] to follow up tomorrow with patient/family/TOC to discuss bed availability.   Daphene Calamity, MSW [facility] Liaison  [phone]

## 2021-10-07 NOTE — TOC Progression Note (Addendum)
Transition of Care [facility]) - Progression Note    Patient Details  Name: Daniel Andersen MRN: [id number] Date of Birth: 1932-01-28  Transition of Care [facility]) CM/SW Contact  Leeroy Cha, RN Phone Number: 10/07/2021, 8:26 AM  Clinical Narrative:     Transition of Care [facility]) Screening Note   Patient Details  Name: Daniel Andersen Date of Birth: [birth year]   Transition of Care [facility]) CM/SW Contact:    Leeroy Cha, RN Phone Number: 10/07/2021, 8:26 AM    Transition of Care Department (TOC) has reviewed patient and no TOC needs have been identified at this time. We will continue to monitor patient advancement through interdisciplinary progression rounds. If new patient transition needs arise, please place a TOC consult.  TCF-MISTY GREEN with [facility].  Following for needs and transfer to [facility] when bed available.    Expected Discharge Plan: Skilled Nursing Facility Barriers to Discharge: Insurance Authorization  Expected Discharge Plan and Services Expected Discharge Plan: [place]   Discharge Planning Services: CM Consult   Living arrangements for the past 2 months: Single Family Home                                       Social Determinants of Health (SDOH) Interventions    Readmission Risk Interventions No flowsheet data found.

## 2021-10-07 NOTE — Progress Notes (Signed)
PROGRESS NOTE    GLADE STRAUSSER  YFV:[id number] DOB: 03/26/1932 DOA: 09/25/2021 PCP: Carlena Hurl, PA-C    Brief Narrative:   Daniel Andersen is a 87 year old male with past medical history of hypertension, alcohol abuse presented to the hospital on 09/25/2021 with altered mental status and worsening lower extremity wounds since Thanksgiving.  In the ED, initial vitals were stable.  Lab showed  BUN 115, creatinine 5.18, alkaline phosphatase 199, AST 71, ALT 157, total bilirubin 1.3.  Lactic acid 1.5.  WBC was 22.6.  Hemoglobin was 10.6, platelet count 180.  COVID-19 PCR was negative.  Influenza A/B PCR negative.  EtOH level less than 10.  Chest x-ray showed cardiomegaly, vascular congestion, pulmonary edema and bilateral effusions.  Right foot and tibia/fibula x-ray with no acute osseous abnormality.  Renal ultrasound with significant hydronephrosis bilaterally extending to the level of the urinary bladder suggestive of bladder outlet obstruction.  CT chest without contrast showed changes consistent with bony metastatic disease likely related to prostate carcinoma, bilateral pleural effusions with lower lobe atelectasis..  Patient was started on IV vancomycin and cefepime and IV fluid bolus.  Hospitalist service was then consulted for further evaluation and management.  During hospitalization, patient completed IV Rocephin course for right lower extremity cellulitis but was seen by urology who recommended a Foley catheter placement for obstructive uropathy.  Patient was also seen by cardiology for new onset heart failure but cardiology has signed off at this time.  Currently palliative care on board and patient has been transitioned to comfort care awaiting for residential hospice at [place].  Assessment & Plan:   Principal Problem:   Sepsis due to cellulitis [facility]) Active Problems:   Essential hypertension   Acute renal failure (ARF) (HCC)   Macrocytic anemia   Pleural effusion,  bilateral  Sepsis, secondary to right lower extremity cellulitis Completed IV Rocephin course.  Blood cultures negative Mild leukocytosis  T-max of 98.3 F.  Acute renal failure with metabolic acidosis secondary to Obstructive uropathy Initial creatinine on presentation was  5.18.  Renal ultrasound showed significant bilateral hydronephrosis with bladder outlet obstruction.   Urology was consulted but due to poor prognosis no aggressive intervention was advised.  Continue Foley catheter.  Latest creatinine at 4.0.  Currently on comfort care.  Acute systolic congestive heart failure, new diagnosis  Patient had large bilateral pleural effusions, significant peripheral edema and elevated BNP at  3827. 2D echocardiogram showed  LVEF 25-30%, LV severely decreased function with global hypokinesis. Cardiology initially consulted, now signed off as of 09/29/2021.  Patient is on comfort care at this time.  Continue Lasix IV for comfort.  Atrial fibrillation with RVR.   TSH: 1.541, was on amiodarone and metoprolol.  Currently on comfort care.  Osseous metastatic lesions concerning for prostate cancer as primary Several osseous metastasis.   Palliative care on board.  On comfort care currently  Anemia, intermittent GI bleed. Heparin has been discontinued.  Continue Protonix 40 mg twice daily.   Latest hemoglobin of 7.1 .  Currently on comfort care.  Elevated LFTs:  Thought to be multifactorial from sepsis, congestive hepatomegaly.    Hypokalemia Was replenished during hospitalization.  No Plan for further blood work.  Mild hyponatremia.   Was monitored.  Currently on comfort care  Hypomagnesemia. Replenished during hospitalization.  Thrombocytopenia.  Improved during hospitalization, latest platelet count of 178.  Alcohol use disorder Patient had reported drinking 1-2 beers daily at home.  Was initially on on CIWA protocol  days, off protocol at this time.  Appears to be stable.  On Ativan as  needed for anxiety.  Ethics/goals of care.  Patient's prognosis appears to be poor at this time.  Currently on comfort care.  Awaiting for [facility].  DVT prophylaxis:        Code Status: DNR  Family Communication:  I spoke with the patient's daughter on the phone on 10/04/2021.  Palliative care on board.  Spoke at the progression rounds.  Disposition Plan:  On comfort care.  Status is: Inpatient  Remains inpatient appropriate because: Comfort care.  Anticipating hospital death/residential hospice at [place].  TOC consulted.  Consultants:  Urology Cardiology -signed off 09/30/19/2022 Palliative care  Procedures:  Foley catheter placement 09/25/21 PRBC transfusion  Antimicrobials:  Ceftriaxone 12/9>> completed  Subjective: Today, patient was seen and examined at bedside.  Patient denies any pain, nausea, vomiting, communicative.  Objective: Vitals:   10/05/21 1239 10/06/21 0055 10/06/21 0929 10/06/21 2043  BP: 108/63 (!) 97/51 (!) 108/52 106/63  Pulse: (!) 106 (!) 101 80 62  Resp: 20 20 18 20   Temp: 97.8 F (36.6 C) 97.7 F (36.5 C) 98.3 F (36.8 C) 97.8 F (36.6 C)  TempSrc: Axillary Axillary Oral Oral  SpO2: 100% 94% 99% 100%  Weight:      Height:        Intake/Output Summary (Last 24 hours) at 10/07/2021 0709 Last data filed at 10/07/2021 0300 Gross per 24 hour  Intake 1320 ml  Output 2225 ml  Net -905 ml    Filed Weights   10/03/21 0500 10/04/21 0500 10/05/21 0500  Weight: 75 kg 74.8 kg 72.7 kg    Physical examination:  General:  Average built, not in obvious distress, frail elderly male communicative,  chronically ill, appears comfortable. HENT:   No scleral pallor or icterus noted. Oral mucosa is moist.  Chest:    Diminished breath sounds bilaterally.  Coarse breath sounds noted. CVS: S1 &S2 heard. No murmur.  Regular rate and rhythm.  Irregular rhythm. Abdomen: Soft, nontender, nondistended.  Bowel sounds are heard.   Extremities:  No cyanosis, clubbing with mild lower extremity edema. Psych: Alert, awake and oriented to self and month, normal mood CNS:  No cranial nerve deficits.  Moves all the extremities Skin: Warm and dry.  See picture below for lower extremity with erythema edema on presentation, currently on dressing.    Data Reviewed: I have personally reviewed the following labs and imaging studies   CBC: Recent Labs  Lab 10/01/21 0508 10/01/21 1725 10/02/21 0444 10/02/21 1344 10/03/21 0450 10/04/21 0503 10/05/21 0525  WBC 17.2*  --  14.5*  --  16.1* 15.3* 14.5*  HGB 8.5*   < > 7.4* 7.9* 8.1* 8.1* 7.1*  HCT 24.9*   < > 22.1* 23.1* 23.6* 23.7* 21.4*  MCV 100.8*  --  89.8  --  87.4 90.1 93.0  PLT 140*  --  157  --  156 168 178   < > = values in this interval not displayed.    Basic Metabolic Panel: Recent Labs  Lab 10/01/21 0508 10/02/21 0444 10/03/21 0450 10/04/21 0503 10/05/21 0525  NA 134* 132* 131* 133* 130*  K 3.8 3.2* 2.8* 3.2* 3.4*  CL 102 98 95* 96* 95*  CO2 18* 17* 21* 19* 19*  GLUCOSE 135* 131* 158* 118* 118*  BUN 150* 150* 148* 134* 118*  CREATININE 4.19* 4.09* 4.15* 4.30* 4.01*  CALCIUM 7.1* 7.5* 7.3* 7.1* 7.3*  MG 2.0  1.7 1.7 1.6* 2.0    GFR: Estimated Creatinine Clearance: 12.6 mL/min (A) (by C-G formula based on SCr of 4.01 mg/dL (H)). Liver Function Tests: Recent Labs  Lab 10/01/21 0508 10/02/21 0444  AST 45* 34  ALT 56* 47*  ALKPHOS 124 110  BILITOT 0.4 0.5  PROT 5.3* 5.0*  ALBUMIN 2.3* 2.1*    No results for input(s): LIPASE, AMYLASE in the last 168 hours. No results for input(s): AMMONIA in the last 168 hours. Coagulation Profile: No results for input(s): INR, PROTIME in the last 168 hours.  Cardiac Enzymes: No results for input(s): CKTOTAL, CKMB, CKMBINDEX, TROPONINI in the last 168 hours. BNP (last 3 results) No results for input(s): PROBNP in the last 8760 hours. HbA1C: No results for input(s): HGBA1C in the last 72 hours. CBG: No results for  input(s): GLUCAP in the last 168 hours.  Lipid Profile: No results for input(s): CHOL, HDL, LDLCALC, TRIG, CHOLHDL, LDLDIRECT in the last 72 hours. Thyroid Function Tests: No results for input(s): TSH, T4TOTAL, FREET4, T3FREE, THYROIDAB in the last 72 hours.  Anemia Panel: No results for input(s): VITAMINB12, FOLATE, FERRITIN, TIBC, IRON, RETICCTPCT in the last 72 hours.  Sepsis Labs: No results for input(s): PROCALCITON, LATICACIDVEN in the last 168 hours.   No results found for this or any previous visit (from the past 240 hour(s)).     Radiology Studies: No results found.  Scheduled Meds:  furosemide  40 mg Intravenous Daily   Gerhardt's butt cream   Topical Daily   mouth rinse  15 mL Mouth Rinse BID   pantoprazole (PROTONIX) IV  40 mg Intravenous Q12H   sodium chloride flush  3 mL Intravenous Q12H   Continuous Infusions:    LOS: 12 days    Flora Lipps, MD [facility] 10/07/2021, 7:09 AM

## 2021-10-08 NOTE — Progress Notes (Signed)
° ° ° °  Chart reviewed. Patient examined at the bedside.   Daniel Andersen is resting but easily awakened with verbal stimuli. He complains of some pain but cannot pinpoint. States "all over". No acute distress noted. His lunch tray is at the bedside. He has not eaten anything except some of his chocolate ice cream. Appetite continues to remain poor with a few bites of ice cream and sips occasionally. No family at the bedside.   I spoke with his daughter via phone and provided updates. She confirms wishes to focus on his comfort. She is aware referral has been placed for [facility] as requested.   Daniel Andersen continues to be frail. Hemoglobin 7.1 on 12/17 with obvious blood in bowel movements. No further work-up with the understanding of widespread metastatic cancer and poor prognosis.   We will continue to focus all care on his comfort with pending [facility] approval. Daughter is aware and appreciative of current plan.   Assessment -frail, weak, thin -Tachycardic, hypotensive -normal breathing pattern -AAO x2, will follow some commands  Plan -continue comfort focused care -Pending [facility] review and approval for ongoing end-of-life care -PMT will continue to support and follow.   Time Total: 20 min.   Visit consisted of counseling and education dealing with the complex and emotionally intense issues of symptom management and palliative care in the setting of serious and potentially life-threatening illness.Greater than 50%  of this time was spent counseling and coordinating care related to the above assessment and plan.  Daniel Andersen, AGPCNP-BC  Palliative Medicine Team [phone]

## 2021-10-08 NOTE — TOC Transition Note (Addendum)
Transition of Care [facility]) - CM/SW Discharge Note   Patient Details  Name: Daniel Andersen MRN: [id number] Date of Birth: 12-27-1931  Transition of Care [facility]) CM/SW Contact:  Ross Ludwig, LCSW Phone Number: 10/08/2021, 2:29 PM   Clinical Narrative:    Patient's family is meeting with [facility] at 4pm to complete consent paperwork.  CSW spoke to [facility] and she requested to set EMS up for 5pm.  Patient to be d/c'ed today to [facility].  Patient and family agreeable to plans will transport via ems RN to call report [phone].     Final next level of care: [place] Barriers to Discharge: Barriers Resolved   Patient Goals and CMS Choice Patient states their goals for this hospitalization and ongoing recovery are:: To go to [facility] for end of life care. CMS Medicare.gov Compare Post Acute Care list provided to:: Patient Represenative (must comment) Choice offered to / list presented to : Adult Children  Discharge Placement                       Discharge Plan and Services   Discharge Planning Services: CM Consult                                 Social Determinants of Health (SDOH) Interventions     Readmission Risk Interventions No flowsheet data found.

## 2021-10-08 NOTE — Progress Notes (Signed)
[facility]  [facility]) Hospital Liaison Note  Bed offered at [facility] and accepted by family. Plan is to transfer today. Unit RN, please call report to [phone] prior to patient leaving the unit.   TOC can arrange transport once confirmation has been received that consents are complete.   Please call with any questions.   [facility] Liaison  [phone]

## 2021-10-08 NOTE — TOC Progression Note (Addendum)
Transition of Care [facility]) - Progression Note    Patient Details  Name: Daniel Andersen MRN: [id number] Date of Birth: 1932-02-23  Transition of Care [facility]) CM/SW Contact  Ross Ludwig, [place] Phone Number: 10/08/2021, 10:34 AM  Clinical Narrative:     CSW was informed that [facility] has a bed available for today.  [facility] rep will let CSW know once bed is ready and consents have been completed.  CSW updated attending physician and bedside nurse.     Expected Discharge Plan: Skilled Nursing Facility Barriers to Discharge: Insurance Authorization  Expected Discharge Plan and Services Expected Discharge Plan: [place]   Discharge Planning Services: CM Consult   Living arrangements for the past 2 months: Single Family Home                                       Social Determinants of Health (SDOH) Interventions    Readmission Risk Interventions No flowsheet data found.

## 2021-10-08 NOTE — Discharge Summary (Signed)
Physician Discharge Summary  Daniel Andersen MBE:[id number] DOB: 10-29-1931 DOA: 09/25/2021  PCP: Carlena Hurl, PA-C  Admit date: 09/25/2021  Discharge date: 10/08/2021  Admitted From: Home.  Disposition: ( Hospice )  [facility]  Recommendations for Outpatient Follow-up:  Patient is being discharged to [facility] facility.   Home Health: None Equipment/Devices:None  Discharge Condition: Stable CODE STATUS: DNR Diet recommendation:  Thin Liquids.  Brief Summary / Hospital Course: This 87 year old male with PMH significant of hypertension, alcohol abuse presented to the hospital on 09/25/2021 with altered mental status and worsening lower extremity wounds since Thanksgiving. Right foot and tibia/fibula x-ray with no acute osseous abnormality.  Renal ultrasound showed significant hydronephrosis bilaterally extending to the level of the urinary bladder suggestive of bladder outlet obstruction.  CT chest without contrast showed changes consistent with bony metastatic disease likely related to prostate carcinoma, bilateral pleural effusions with lower lobe atelectasis.. Patient was started on IV vancomycin and cefepime and IV fluid bolus.  Patient was hospitalized for severe sepsis secondary to cellulitis. Patient has completed IV Rocephin course for right lower extremity cellulitis but was seen by urology who recommended a Foley catheter placement for obstructive uropathy.  Patient was also seen by cardiology for new onset heart failure but cardiology has signed off at this time.  Patient has downward decline in overall conditioning.  Patient has poor prognosis.  Palliative care was consulted to discuss goals of care.  Patient has been transitioned to comfort care awaiting for residential hospice at [place]. Patient has been discharged to [facility] for hospice care.   He was managed for below problems.  Discharge Diagnoses:  Principal Problem:   Sepsis due to  cellulitis [facility]) Active Problems:   Essential hypertension   Acute renal failure (ARF) (HCC)   Macrocytic anemia   Pleural effusion, bilateral  Sepsis, secondary to right lower extremity cellulitis Completed IV Rocephin course.  Blood cultures negative.  Mild leukocytosis  T-max of 98.3 F.   Acute renal failure with metabolic acidosis secondary to Obstructive uropathy Initial creatinine on presentation was  5.18.  Renal ultrasound showed significant bilateral hydronephrosis with bladder outlet obstruction.   Urology was consulted but due to poor prognosis no aggressive intervention was advised.  Continue Foley catheter.  Latest creatinine at 4.0.  Currently on comfort care.   Acute systolic congestive heart failure, new diagnosis  Patient had large bilateral pleural effusions, significant peripheral edema and elevated BNP at  3827. 2D echocardiogram showed  LVEF 25-30%, LV severely decreased function with global hypokinesis. Cardiology initially consulted, now signed off as of 09/29/2021.  Patient is on comfort care at this time.  Continue Lasix IV for comfort.   Atrial fibrillation with RVR.   TSH: 1.541, was on amiodarone and metoprolol.  Currently on comfort care.   Osseous metastatic lesions concerning for prostate cancer as primary Several osseous metastasis.   Palliative care on board.  On comfort care currently   Anemia, intermittent GI bleed. Heparin has been discontinued.  Continue Protonix 40 mg twice daily.   Latest hemoglobin of 7.1 .  Currently on comfort care.   Elevated LFTs:  Thought to be multifactorial from sepsis, congestive hepatomegaly.     Hypokalemia Replaced.  No Plan for further blood work.   Mild hyponatremia.   Replaced.  Currently on comfort care   Hypomagnesemia. Replenished during hospitalization.   Thrombocytopenia.  Improved during hospitalization, latest platelet count of 178.   Alcohol use disorder Patient had reported  drinking 1-2 beers  daily at home.  Initially on CIWA protocol days, off protocol at this time.  Appears to be stable.  On Ativan as needed for anxiety.   Ethics/goals of care.  Patient's prognosis appears to be poor at this time.  Currently on comfort care.  Awaiting for [facility].  Discharge Instructions  Discharge Instructions     Call MD for:  difficulty breathing, headache or visual disturbances   Complete by: As directed    Call MD for:  persistant nausea and vomiting   Complete by: As directed    Diet - low sodium heart healthy   Complete by: As directed    Diet full liquid   Complete by: As directed    Discharge instructions   Complete by: As directed    Patient is being discharged to [facility] facility.   Discharge wound care:   Complete by: As directed    Patient is being discharged to [facility] facility.   Increase activity slowly   Complete by: As directed       Allergies as of 10/08/2021   No Known Allergies      Medication List     STOP taking these medications    alendronate 70 MG tablet Commonly known as: FOSAMAX   amLODipine 5 MG tablet Commonly known as: NORVASC   aspirin EC 81 MG tablet   bisoprolol-hydrochlorothiazide 5-6.25 MG tablet Commonly known as: ZIAC   Calcium 600 600 MG Tabs tablet Generic drug: calcium carbonate   Centrum Silver Ultra Mens Tabs   fish oil-omega-3 fatty acids 1000 MG capsule   ibuprofen 200 MG tablet Commonly known as: ADVIL   OVER THE COUNTER MEDICATION   oxymetazoline 0.05 % nasal spray Commonly known as: AFRIN   Vitamin D 1000 units capsule               Discharge Care Instructions  (From admission, onward)           Start     Ordered   10/08/21 0000  Discharge wound care:       Comments: Patient is being discharged to [facility] facility.   10/08/21 1048            Contact information for follow-up providers     Tysinger, Camelia Eng, PA-C Follow up in 1 week(s).    Specialty: Family Medicine Contact information: [place] [address] [phone]              Contact information for after-discharge care     Destination     [facility] .   Service: Skilled Nursing Contact information: [address] [place] [phone]                    No Known Allergies  Consultations: Oncology.   Procedures/Studies: CT ABDOMEN PELVIS WO CONTRAST  Result Date: 09/27/2021 CLINICAL DATA:  Hydronephrosis EXAM: CT ABDOMEN AND PELVIS WITHOUT CONTRAST TECHNIQUE: Multidetector CT imaging of the abdomen and pelvis was performed following the standard protocol without IV contrast. COMPARISON:  Renal ultrasound dated 09/25/2021 FINDINGS: Motion degraded images. Lower chest: Moderate bilateral pleural effusions with compressive atelectasis. Hepatobiliary: Unenhanced liver is unremarkable. Gallbladder is unremarkable. No intrahepatic or extrahepatic ductal dilatation. Pancreas: Within normal limits. Spleen: Within normal limits. Adrenals/Urinary Tract: Adrenal glands are within normal limits. Kidneys are notable for a 17 mm left lower pole renal cyst (series 2/image 33).  Punctate nonobstructing left lower pole renal calculus (series 2/image 28). Moderate bilateral hydroureteronephrosis to the level of the UVJ. Concentric but irregular masslike bladder wall thickening (series 2/image 62). Bladder is decompressed by an indwelling Foley catheter. Differential considerations include chronic bladder outlet obstruction/neurogenic bladder or infiltrating urothelial neoplasm. Stomach/Bowel: Stomach is within normal limits. No evidence of bowel obstruction. Normal appendix (series 2/image 47). Mild left colonic diverticulosis, without evidence of diverticulitis. Vascular/Lymphatic: No evidence of abdominal aortic aneurysm. Atherosclerotic calcifications of the abdominal aorta and branch vessels.  No suspicious abdominopelvic lymphadenopathy. Reproductive: Prostate is notable for dystrophic calcifications and mild enlargement of the central gland, indenting the base of the bladder, suggesting BPH Other: No abdominopelvic ascites. Musculoskeletal: Multifocal sclerotic osseous metastases throughout the visualized axial and appendicular skeleton. This raises concern for nonvisualized primary malignancy such as prostate cancer. IMPRESSION: Multifocal sclerotic osseous metastases throughout the visualized axial hepatic color skeleton. This raises concern for nonvisualized primary malignancy such as prostate cancer. Correlate with PSA. Prostate is poorly evaluated on unenhanced CT.  Suspected BPH. Bladder decompressed by indwelling Foley catheter. Masslike concentric bladder wall thickening may reflect chronic bladder outlet obstruction/neurogenic bladder, but infiltrating urothelial neoplasm is not excluded. Consider cystoscopy as clinically warranted. Moderate bilateral hydroureteronephrosis to the level of the UVJ, presumably secondary to the findings above. Moderate bilateral pleural effusions, incompletely visualized. Electronically Signed   By: Julian Hy M.D.   On: 09/27/2021 19:52   DG Chest 1 View  Result Date: 09/25/2021 CLINICAL DATA:  Dyspnea EXAM: CHEST  1 VIEW COMPARISON:  03/21/2013 FINDINGS: Cardiomegaly with vascular congestion, pulmonary edema and bilateral effusions. Bibasilar consolidations. Aortic atherosclerosis. No pneumothorax. Possible tracheal deviation to the right. Multifocal osseous sclerosis is suspicious for metastatic disease. IMPRESSION: 1. Cardiomegaly with vascular congestion, pulmonary edema and bilateral effusions. Bibasilar consolidations consistent with atelectasis or pneumonia 2. Multifocal osseous sclerosis, suspicious for metastatic disease. 3. Suspect tracheal deviation to the right, left paratracheal mass cannot be excluded. Suggest correlation with chest CT.  Electronically Signed   By: Donavan Foil M.D.   On: 09/25/2021 20:03   DG Tibia/Fibula Right  Result Date: 09/25/2021 CLINICAL DATA:  Lower extremity wound EXAM: RIGHT TIBIA AND FIBULA - 2 VIEW COMPARISON:  None. FINDINGS: No fracture or malalignment. Diffuse soft tissue swelling. Vascular calcifications. No soft tissue emphysema IMPRESSION: No acute osseous abnormality Electronically Signed   By: Donavan Foil M.D.   On: 09/25/2021 20:05   CT CHEST WO CONTRAST  Result Date: 09/25/2021 CLINICAL DATA:  Evaluate pleural effusion EXAM: CT CHEST WITHOUT CONTRAST TECHNIQUE: Multidetector CT imaging of the chest was performed following the standard protocol without IV contrast. COMPARISON:  Chest x-ray from earlier in the same day FINDINGS: Cardiovascular: Atherosclerotic calcifications are noted without aneurysmal dilatation. Coronary calcifications are seen. No cardiac enlargement is noted. No pericardial effusion is seen. Pulmonary artery is not significantly enlarged. Mediastinum/Nodes: Thoracic inlet is within normal limits. No sizable hilar or mediastinal adenopathy is noted. The esophagus as visualized is within normal limits. Lungs/Pleura: Bilateral large pleural effusions are noted. Associated basilar atelectasis is noted. Lungs are otherwise well aerated without focal infiltrate or parenchymal nodules. Upper Abdomen: Visualized upper abdomen demonstrates fullness of the renal pelves bilaterally of uncertain significance/chronicity. Musculoskeletal: Multiple sclerotic lesions are identified throughout the visualized bony skeleton consistent with metastatic disease. This raises suspicion for prostate carcinoma. IMPRESSION: Changes consistent with bony metastatic disease likely related to prostate carcinoma. Further workup is recommended. Bilateral pleural effusions with lower lobe atelectasis. No focal mass lesion  is noted. Fullness in the collecting systems of the kidneys bilaterally which may be related  to bladder outlet obstruction but incompletely evaluated on this exam. Aortic Atherosclerosis (ICD10-I70.0). Electronically Signed   By: Inez Catalina M.D.   On: 09/25/2021 20:41   US RENAL  Result Date: 09/25/2021 CLINICAL DATA:  Acute renal failure EXAM: RENAL / URINARY TRACT ULTRASOUND COMPLETE COMPARISON:  None. FINDINGS: Right Kidney: Renal measurements: 11.5 x 4.7 x 4.2 cm. = volume: 18 mL. Significant hydronephrosis is noted. Continues into the proximal right ureter. Left Kidney: Renal measurements: 11.5 x 5.7 x 4.9 cm. = volume: 169 mL. Significant hydronephrosis is noted similar to that seen on the right. 1.5 cm cyst is noted inferiorly. Bladder: Bladder is well distended. Some mild irregularity of the posterior bladder wall is noted without discrete mass. May represent increased trabeculation possibly related to bladder outlet obstruction. Distal ureters appear dilated. Note is made of a left-sided pleural effusion. Other: None. IMPRESSION: Significant hydronephrosis bilaterally which extends to the level of the urinary bladder. Findings suggestive of bladder outlet obstruction are noted. Repeat imaging following decompression of the bladder may be helpful as clinically indicated. Left pleural effusion. Left renal cyst. Electronically Signed   By: Inez Catalina M.D.   On: 09/25/2021 22:20   DG Foot Complete Right  Result Date: 09/25/2021 CLINICAL DATA:  Right lower extremity wound infection EXAM: RIGHT FOOT COMPLETE - 3+ VIEW COMPARISON:  None. FINDINGS: No fracture or malalignment. 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 Diffuse soft tissue swelling without emphysema. Vascular calcifications. Small plantar calcaneal spur. IMPRESSION: No acute osseous abnormality. Electronically Signed   By: Donavan Foil M.D.   On: 09/25/2021 20:04   ECHOCARDIOGRAM COMPLETE  Result Date: 09/26/2021    ECHOCARDIOGRAM REPORT   Patient Name:   Daniel Andersen Date of Exam: 09/26/2021 Medical Rec #:  [id number]        Height:       68.0 in Accession #:    [id number]      Weight:       170.0 lb Date of Birth:  07-22-1932       BSA:          1.907 m Patient Age:    43 years        BP:           122/72 mmHg Patient Gender: M               HR:           141 bpm. Exam Location:  Inpatient Procedure: 2D Echo, Color Doppler, Cardiac Doppler and Intracardiac            Opacification Agent Indications:     CHF  History:         Patient has no prior history of Echocardiogram examinations.                  Risk Factors:Hypertension.  Sonographer:     Jyl Heinz Referring Phys:  [id number] Cleaster Corin PATEL Diagnosing Phys: Gwyndolyn Kaufman MD IMPRESSIONS  1. Left ventricular ejection fraction, by estimation, is 25 to 30%. The left ventricle has severely decreased function. The left ventricle demonstrates global hypokinesis. There is mild concentric left ventricular hypertrophy. Left ventricular diastolic  parameters are indeterminate. Elevated left atrial pressure.  2. Right ventricular systolic function is severely reduced. The right ventricular size is normal.  3. Left atrial size was mildly dilated.  4. Moderate pleural effusion in the  left lateral region.  5. The mitral valve is degenerative. Trivial mitral valve regurgitation. Moderate mitral annular calcification.  6. The aortic valve is tricuspid. There is mild calcification of the aortic valve. There is mild thickening of the aortic valve. Aortic valve regurgitation is trivial. Aortic valve sclerosis is present, with no evidence of aortic valve stenosis.  7. The inferior vena cava is dilated in size with <50% respiratory variability, suggesting right atrial pressure of 15 mmHg.  8. Tachycardic during study with HR 130s with no discernable p-waves seen (possible Afib) Comparison(s): No prior Echocardiogram. FINDINGS  Left Ventricle: Left ventricular ejection fraction, by estimation, is 25 to 30%. The left ventricle has severely decreased function. The left ventricle demonstrates  global hypokinesis. The left ventricular internal cavity size was normal in size. There is mild concentric left ventricular hypertrophy. Left ventricular diastolic parameters are indeterminate. Elevated left atrial pressure. Right Ventricle: The right ventricular size is normal. No increase in right ventricular wall thickness. Right ventricular systolic function is severely reduced. Left Atrium: Left atrial size was mildly dilated. Right Atrium: Right atrial size was normal in size. Pericardium: There is no evidence of pericardial effusion. Mitral Valve: The mitral valve is degenerative in appearance. There is mild thickening of the mitral valve leaflet(s). There is mild calcification of the mitral valve leaflet(s). Moderate mitral annular calcification. Trivial mitral valve regurgitation. Tricuspid Valve: The tricuspid valve is normal in structure. Tricuspid valve regurgitation is trivial. Aortic Valve: The aortic valve is tricuspid. There is mild calcification of the aortic valve. There is mild thickening of the aortic valve. Aortic valve regurgitation is trivial. Aortic valve sclerosis is present, with no evidence of aortic valve stenosis. Aortic valve peak gradient measures 5.6 mmHg. Pulmonic Valve: The pulmonic valve was normal in structure. Pulmonic valve regurgitation is trivial. Aorta: The aortic root and ascending aorta are structurally normal, with no evidence of dilitation. Venous: The inferior vena cava is dilated in size with less than 50% respiratory variability, suggesting right atrial pressure of 15 mmHg. IAS/Shunts: No atrial level shunt detected by color flow Doppler. Additional Comments: There is a moderate pleural effusion in the left lateral region.  LEFT VENTRICLE PLAX 2D LVIDd:         4.90 cm      Diastology LVIDs:         4.00 cm      LV e' medial:    5.26 cm/s LV PW:         1.10 cm      LV E/e' medial:  20.2 LV IVS:        1.10 cm      LV e' lateral:   7.07 cm/s LVOT diam:     2.00 cm       LV E/e' lateral: 15.0 LV SV:         30 LV SV Index:   16 LVOT Area:     3.14 cm  LV Volumes (MOD) LV vol d, MOD A2C: 140.0 ml LV vol d, MOD A4C: 135.0 ml LV vol s, MOD A2C: 101.0 ml LV vol s, MOD A4C: 89.8 ml LV SV MOD A2C:     39.0 ml LV SV MOD A4C:     135.0 ml LV SV MOD BP:      44.9 ml RIGHT VENTRICLE            IVC RV Basal diam:  3.60 cm    IVC diam: 2.20 cm RV Mid diam:  3.10 cm RV S prime:     6.92 cm/s TAPSE (M-mode): 0.7 cm LEFT ATRIUM             Index        RIGHT ATRIUM           Index LA diam:        4.10 cm 2.15 cm/m   RA Area:     16.60 cm LA Vol (A2C):   70.3 ml 36.86 ml/m  RA Volume:   42.00 ml  22.02 ml/m LA Vol (A4C):   66.0 ml 34.60 ml/m LA Biplane Vol: 71.6 ml 37.54 ml/m  AORTIC VALVE                 PULMONIC VALVE AV Area (Vmax): 1.86 cm     PR End Diast Vel: 1.56 msec AV Vmax:        118.00 cm/s AV Peak Grad:   5.6 mmHg LVOT Vmax:      70.00 cm/s LVOT Vmean:     49.900 cm/s LVOT VTI:       0.094 m  AORTA Ao Root diam: 2.90 cm Ao Asc diam:  3.20 cm MITRAL VALVE                TRICUSPID VALVE MV Area (PHT): 5.84 cm     TR Peak grad:   19.7 mmHg MV Decel Time: 130 msec     TR Vmax:        222.00 cm/s MV E velocity: 106.00 cm/s                             SHUNTS                             Systemic VTI:  0.09 m                             Systemic Diam: 2.00 cm Gwyndolyn Kaufman MD Electronically signed by Gwyndolyn Kaufman MD Signature Date/Time: 09/26/2021/10:58:34 AM    Final (Updated)    US Abdomen Limited RUQ (LIVER/GB)  Result Date: 09/25/2021 CLINICAL DATA:  Elevated LFT EXAM: ULTRASOUND ABDOMEN LIMITED RIGHT UPPER QUADRANT COMPARISON:  None. FINDINGS: Gallbladder: No gallstones or wall thickening visualized. No sonographic Murphy sign noted by sonographer. Common bile duct: Diameter: 4 mm Liver: No focal lesion identified. Within normal limits in parenchymal echogenicity. Portal vein is patent on color Doppler imaging with normal direction of blood flow towards the liver.  Other: Right pleural effusion. Incompletely evaluated right hydronephrosis IMPRESSION: 1. Ultrasound appearance of the liver is within normal limits. 2. Right pleural effusion 3. Incompletely visualized right hydronephrosis Electronically Signed   By: Donavan Foil M.D.   On: 09/25/2021 22:23     Subjective: Patient was seen and examined at bedside.  Overnight events noted.   Patient is on comfort care,  awaiting bed in [facility].  Patient is comfortable. Patient is being discharged to [facility] today.  Discharge Exam: Vitals:   10/07/21 2222 10/08/21 0656  BP:  (!) 108/56  Pulse: (!) 101 (!) 53  Resp:  17  Temp:  (!) 97.5 F (36.4 C)  SpO2: 100% 100%   Vitals:   10/07/21 1149 10/07/21 2222 10/07/21 2222 10/08/21 0656  BP: 130/63 (!) 111/56  (!) 108/56  Pulse: (!) 138 (!) 114 (!) 101 Marland Kitchen)  53  Resp: 16 16  17   Temp: 99.1 F (37.3 C) 97.8 F (36.6 C)  (!) 97.5 F (36.4 C)  TempSrc: Axillary Oral  Oral  SpO2: 98% 99% 100% 100%  Weight:      Height:        General: Pt is alert, awake, not in acute distress Cardiovascular: RRR, S1/S2 +, no rubs, no gallops Respiratory: CTA bilaterally, no wheezing, no rhonchi Abdominal: Soft, NT, ND, bowel sounds + Extremities: no edema, no cyanosis    The results of significant diagnostics from this hospitalization (including imaging, microbiology, ancillary and laboratory) are listed below for reference.     Microbiology: No results found for this or any previous visit (from the past 240 hour(s)).   Labs: BNP (last 3 results) Recent Labs    09/25/21 1824  BNP 9,163.8*   Basic Metabolic Panel: Recent Labs  Lab 10/02/21 0444 10/03/21 0450 10/04/21 0503 10/05/21 0525  NA 132* 131* 133* 130*  K 3.2* 2.8* 3.2* 3.4*  CL 98 95* 96* 95*  CO2 17* 21* 19* 19*  GLUCOSE 131* 158* 118* 118*  BUN 150* 148* 134* 118*  CREATININE 4.09* 4.15* 4.30* 4.01*  CALCIUM 7.5* 7.3* 7.1* 7.3*  MG 1.7 1.7 1.6* 2.0   Liver Function  Tests: Recent Labs  Lab 10/02/21 0444  AST 34  ALT 47*  ALKPHOS 110  BILITOT 0.5  PROT 5.0*  ALBUMIN 2.1*   No results for input(s): LIPASE, AMYLASE in the last 168 hours. No results for input(s): AMMONIA in the last 168 hours. CBC: Recent Labs  Lab 10/02/21 0444 10/02/21 1344 10/03/21 0450 10/04/21 0503 10/05/21 0525  WBC 14.5*  --  16.1* 15.3* 14.5*  HGB 7.4* 7.9* 8.1* 8.1* 7.1*  HCT 22.1* 23.1* 23.6* 23.7* 21.4*  MCV 89.8  --  87.4 90.1 93.0  PLT 157  --  156 168 178   Cardiac Enzymes: No results for input(s): CKTOTAL, CKMB, CKMBINDEX, TROPONINI in the last 168 hours. BNP: Invalid input(s): POCBNP CBG: No results for input(s): GLUCAP in the last 168 hours. D-Dimer No results for input(s): DDIMER in the last 72 hours. Hgb A1c No results for input(s): HGBA1C in the last 72 hours. Lipid Profile No results for input(s): CHOL, HDL, LDLCALC, TRIG, CHOLHDL, LDLDIRECT in the last 72 hours. Thyroid function studies No results for input(s): TSH, T4TOTAL, T3FREE, THYROIDAB in the last 72 hours.  Invalid input(s): FREET3 Anemia work up No results for input(s): VITAMINB12, FOLATE, FERRITIN, TIBC, IRON, RETICCTPCT in the last 72 hours. Urinalysis    Component Value Date/Time   COLORURINE YELLOW 09/25/2021 2011   APPEARANCEUR CLEAR 09/25/2021 2011   LABSPEC 1.015 09/25/2021 2011   PHURINE 5.5 09/25/2021 2011   GLUCOSEU NEGATIVE 09/25/2021 2011   [place] NEGATIVE 09/25/2021 2011   [place] NEGATIVE 09/25/2021 2011   BILIRUBINUR neg 03/04/2011 0855   KETONESUR NEGATIVE 09/25/2021 2011   PROTEINUR NEGATIVE 09/25/2021 2011   UROBILINOGEN neg 03/04/2011 0855   NITRITE NEGATIVE 09/25/2021 2011   LEUKOCYTESUR TRACE (A) 09/25/2021 2011   Sepsis Labs Invalid input(s): PROCALCITONIN,  WBC,  LACTICIDVEN Microbiology No results found for this or any previous visit (from the past 240 hour(s)).   Time coordinating discharge: Over 30 minutes  SIGNED:   Shawna Clamp,  MD  [facility] 10/08/2021, 12:11 PM Pager   If 7PM-7AM, please contact night-coverage

## 2021-10-08 NOTE — Progress Notes (Signed)
Pt to be discharged to [facility] today. Report called to this facility. VSS and no acute changes noted at this time

## 2021-10-15 ENCOUNTER — Telehealth: Payer: Self-pay | Admitting: Medical

## 2021-10-15 NOTE — Telephone Encounter (Signed)
Sympathy Card sent. 

## 2021-10-15 NOTE — Telephone Encounter (Signed)
I received a record from [facility] that he passed away this past week  Please send card to family which I believe is [name], daughter, listed in the chart record  Please change his status to deceased

## 2021-10-20 DEATH — deceased

## 2021-10-22 ENCOUNTER — Telehealth: Payer: Self-pay | Admitting: Family Medicine

## 2021-10-22 NOTE — Telephone Encounter (Signed)
Sympathy card sent
